# Patient Record
Sex: Female | Born: 1990
Health system: Southern US, Community
[De-identification: ages and names within clinical notes are randomized; demographics above are authoritative.]

## PROBLEM LIST (undated history)

## (undated) DIAGNOSIS — Z8619 Personal history of other infectious and parasitic diseases: Secondary | ICD-10-CM

## (undated) DIAGNOSIS — E282 Polycystic ovarian syndrome: Secondary | ICD-10-CM

## (undated) DIAGNOSIS — T7421XA Adult sexual abuse, confirmed, initial encounter: Secondary | ICD-10-CM

## (undated) DIAGNOSIS — Z9889 Other specified postprocedural states: Secondary | ICD-10-CM

## (undated) DIAGNOSIS — E669 Obesity, unspecified: Secondary | ICD-10-CM

## (undated) DIAGNOSIS — D649 Anemia, unspecified: Secondary | ICD-10-CM

## (undated) DIAGNOSIS — F32A Depression, unspecified: Secondary | ICD-10-CM

## (undated) DIAGNOSIS — R112 Nausea with vomiting, unspecified: Secondary | ICD-10-CM

## (undated) DIAGNOSIS — F329 Major depressive disorder, single episode, unspecified: Secondary | ICD-10-CM

## (undated) HISTORY — DX: Major depressive disorder, single episode, unspecified: F32.9

## (undated) HISTORY — DX: Depression, unspecified: F32.A

## (undated) HISTORY — DX: Personal history of other infectious and parasitic diseases: Z86.19

---

## 2011-12-04 ENCOUNTER — Encounter (HOSPITAL_COMMUNITY): Payer: Self-pay | Admitting: Family Medicine

## 2011-12-04 ENCOUNTER — Emergency Department (HOSPITAL_COMMUNITY)
Admission: EM | Admit: 2011-12-04 | Discharge: 2011-12-05 | Disposition: A | Discharge status: Home / Self Care | Payer: Self-pay | Attending: Emergency Medicine | Admitting: Emergency Medicine

## 2011-12-04 DIAGNOSIS — R1012 Left upper quadrant pain: Secondary | ICD-10-CM | POA: Insufficient documentation

## 2011-12-04 DIAGNOSIS — E669 Obesity, unspecified: Secondary | ICD-10-CM | POA: Insufficient documentation

## 2011-12-04 HISTORY — DX: Polycystic ovarian syndrome: E28.2

## 2011-12-04 LAB — URINALYSIS, ROUTINE W REFLEX MICROSCOPIC
Glucose, UA: NEGATIVE mg/dL
Ketones, ur: NEGATIVE mg/dL
Leukocytes, UA: NEGATIVE
Nitrite: NEGATIVE
Protein, ur: 30 mg/dL — AB
pH: 5.5 (ref 5.0–8.0)

## 2011-12-04 LAB — URINE MICROSCOPIC-ADD ON

## 2011-12-04 LAB — POCT PREGNANCY, URINE: Preg Test, Ur: NEGATIVE

## 2011-12-04 NOTE — ED Notes (Addendum)
Patient states she started having left side pain 2 hours ago prior to arrival. 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 States she took Ibuprofen 800mg  with minimal relief. History of PCOS.

## 2011-12-04 NOTE — ED Notes (Signed)
Pain started suddenly in left lower quadrant accompanied by nausea.

## 2011-12-05 ENCOUNTER — Emergency Department (HOSPITAL_COMMUNITY): Payer: Self-pay

## 2011-12-05 LAB — COMPREHENSIVE METABOLIC PANEL
Albumin: 3.4 g/dL — ABNORMAL LOW (ref 3.5–5.2)
Alkaline Phosphatase: 76 U/L (ref 39–117)
BUN: 10 mg/dL (ref 6–23)
Calcium: 9.3 mg/dL (ref 8.4–10.5)
Creatinine, Ser: 0.69 mg/dL (ref 0.50–1.10)
GFR calc Af Amer: >90 mL/min (ref >90)
Glucose, Bld: 82 mg/dL (ref 70–99)
Total Protein: 7.9 g/dL (ref 6.0–8.3)

## 2011-12-05 LAB — DIFFERENTIAL
Basophils Relative: 0 % (ref 0–1)
Eosinophils Absolute: 0.2 10*3/uL (ref 0.0–0.7)
Eosinophils Relative: 2 % (ref 0–5)
Lymphs Abs: 3.5 10*3/uL (ref 0.7–4.0)
Monocytes Absolute: 0.7 10*3/uL (ref 0.1–1.0)
Monocytes Relative: 6 % (ref 3–12)
Neutrophils Relative %: 60 % (ref 43–77)

## 2011-12-05 LAB — CBC
Hemoglobin: 11.7 g/dL — ABNORMAL LOW (ref 12.0–15.0)
MCH: 25.8 pg — ABNORMAL LOW (ref 26.0–34.0)
MCHC: 31.8 g/dL (ref 30.0–36.0)
MCV: 81.2 fL (ref 78.0–100.0)
RBC: 4.53 MIL/uL (ref 3.87–5.11)

## 2011-12-05 MED ORDER — MORPHINE SULFATE 4 MG/ML IJ SOLN
4.0000 mg | Freq: Once | INTRAMUSCULAR | Status: AC
  Administered 2011-12-05: 4 mg via INTRAVENOUS
  Filled 2011-12-05: qty 1

## 2011-12-05 MED ORDER — SODIUM CHLORIDE 0.9 % IV BOLUS (SEPSIS)
1000.0000 mL | Freq: Once | INTRAVENOUS | Status: AC
  Administered 2011-12-05: 1000 mL via INTRAVENOUS

## 2011-12-05 MED ORDER — METOCLOPRAMIDE HCL 10 MG PO TABS: 10.0000 mg | ORAL_TABLET | Freq: Four times a day (QID) | ORAL | Status: DC

## 2011-12-05 MED ORDER — ONDANSETRON HCL 4 MG/2ML IJ SOLN
4.0000 mg | Freq: Once | INTRAMUSCULAR | Status: AC
  Administered 2011-12-05: 4 mg via INTRAVENOUS
  Filled 2011-12-05: qty 2

## 2011-12-05 MED ORDER — TRAMADOL HCL 50 MG PO TABS: 50.0000 mg | ORAL_TABLET | Freq: Four times a day (QID) | ORAL | Status: AC | PRN

## 2011-12-05 NOTE — ED Notes (Signed)
Patient transported to CT 

## 2011-12-05 NOTE — Discharge Instructions (Signed)

## 2011-12-05 NOTE — ED Notes (Signed)
Returned from CT.

## 2011-12-05 NOTE — ED Provider Notes (Signed)
History     CSN: [id number]  Arrival date & time 12/04/11  1836   First MD Initiated Contact with Patient 12/04/11 2337      Chief Complaint  Patient presents with  . Abdominal Pain  . Nausea    (Consider location/radiation/quality/duration/timing/severity/associated sxs/prior treatment) HPI Pt was at work when she had sudden onset LUQ stabbing pain. +Nausea. No vaginal bleeding or d/c. No fever or chills. Pt took 800mg  of ibuprofen with moderate resolution. Pt with normal period 2 weeks ago.  Past Medical History  Diagnosis Date  . Polycystic ovarian syndrome     History reviewed. No pertinent past surgical history.  No family history on file.  History  Substance Use Topics  . Smoking status: Never Smoker   . Smokeless tobacco: Not on file  . Alcohol Use: Yes     Occasional     OB History    Grav Para Term Preterm Abortions TAB SAB Ect Mult Living                  Review of Systems  Constitutional: Negative for fever and chills.  Gastrointestinal: Positive for nausea and abdominal pain. Negative for vomiting, diarrhea and constipation.  Genitourinary: Positive for hematuria. Negative for dysuria, flank pain, vaginal bleeding, vaginal discharge and pelvic pain.  Musculoskeletal: Negative for back pain.  Skin: Negative for rash.  Neurological: Negative for dizziness, weakness and numbness.    Allergies  Review of patient's allergies indicates no known allergies.  Home Medications   Current Outpatient Rx  Name Route Sig Dispense Refill  . ETONOGESTREL 68 MG [place] IMPL Subcutaneous Inject 1 each into the skin once.    Marland Kitchen METOCLOPRAMIDE HCL 10 MG PO TABS Oral Take 1 tablet (10 mg total) by mouth every 6 (six) hours. 30 tablet 0  . TRAMADOL HCL 50 MG PO TABS Oral Take 1 tablet (50 mg total) by mouth every 6 (six) hours as needed for pain. 15 tablet 0    BP 126/76  Pulse 88  Temp(Src) 97.5 F (36.4 C) (Oral)  Resp 18  Ht 5\' 3"  (1.6 m)  Wt 260 lb (117.935 kg)   BMI 46.06 kg/m2  SpO2 97%  LMP 11/20/2011  Physical Exam  Nursing note and vitals reviewed. Constitutional: She is oriented to person, place, and time. She appears well-developed and well-nourished. No distress.       obese  HENT:  Head: Normocephalic and atraumatic.  Mouth/Throat: Oropharynx is clear and moist.  Eyes: EOM are normal. Pupils are equal, round, and reactive to light.  Neck: Normal range of motion. Neck supple.  Cardiovascular: Normal rate and regular rhythm.   Pulmonary/Chest: Effort normal and breath sounds normal. No respiratory distress. She has no wheezes. She has no rales.  Abdominal: Soft. Bowel sounds are normal. She exhibits no mass. There is tenderness (mild LUQ TTP without rebound or guarding. ). There is no rebound and no guarding.       No lower abd TTP   Musculoskeletal: Normal range of motion. She exhibits no edema and no tenderness.  Neurological: She is alert and oriented to person, place, and time.  Skin: Skin is warm and dry. No rash noted. No erythema.  Psychiatric: She has a normal mood and affect. Her behavior is normal.    ED Course  Procedures (including critical care time)  Labs Reviewed  URINALYSIS, ROUTINE W REFLEX MICROSCOPIC - Abnormal; Notable for the following:    APPearance CLOUDY (*)    Specific  Gravity, Urine 1.036 (*)    Hgb urine dipstick LARGE (*)    Protein, ur 30 (*)    All other components within normal limits  URINE MICROSCOPIC-ADD ON - Abnormal; Notable for the following:    Squamous Epithelial / LPF MANY (*)    Bacteria, UA FEW (*)    All other components within normal limits  CBC - Abnormal; Notable for the following:    WBC 11.0 (*)    Hemoglobin 11.7 (*)    MCH 25.8 (*)    RDW 15.7 (*)    All other components within normal limits  COMPREHENSIVE METABOLIC PANEL - Abnormal; Notable for the following:    Albumin 3.4 (*)    Total Bilirubin 0.2 (*)    All other components within normal limits  POCT PREGNANCY,  URINE  DIFFERENTIAL   Ct Abdomen Pelvis Wo Contrast  12/05/2011  *RADIOLOGY REPORT*  Clinical Data: Left-sided abdominal pain.  Hematuria.  CT ABDOMEN AND PELVIS WITHOUT CONTRAST  Technique:  Multidetector CT imaging of the abdomen and pelvis was performed following the standard protocol without intravenous contrast.  Comparison: None.  Findings: Morbid obesity.  No evidence of urinary tract calculi or obstruction.  Within the limits of the unenhanced technique, no focal parenchymal abnormality involving either kidney.  Normal low dose unenhanced appearance of the liver, spleen, pancreas, and adrenal glands.  Gallbladder unremarkable by CT.  No biliary ductal dilation.  No visible atherosclerosis.  No significant lymphadenopathy.  Normal-appearing stomach, small bowel, and colon.  Normal appendix in the right upper pelvis.  No ascites.  Small umbilical hernia containing fat.  Uterus and ovaries normal.  Urinary bladder decompressed and unremarkable.  Bone window images demonstrate early mild degenerative changes in the left sacroiliac joint.  Visualized lung bases clear.  IMPRESSION:  1.  No evidence of urinary tract calculi or obstruction. 2.  No acute abnormalities involving the abdomen or pelvis. 3.  Small umbilical hernia containing fat.  Original Report Authenticated By: Arnell Sieving, M.D.     1. Abdominal pain       MDM  Colic-like pain now resolved after pain meds. CT essentially normal. No pelvic discomfort. Will d/c with instructions to return for worsening pain, uncontrolled vomiting or any concerns. Pt and Pt's mother voiced understanding        Loren Racer, MD 12/05/11 [phone]

## 2014-08-08 ENCOUNTER — Encounter (HOSPITAL_COMMUNITY): Payer: Self-pay | Admitting: Emergency Medicine

## 2014-08-08 ENCOUNTER — Emergency Department (HOSPITAL_COMMUNITY)
Admission: EM | Admit: 2014-08-08 | Discharge: 2014-08-08 | Disposition: A | Discharge status: Home / Self Care | Payer: Self-pay | Attending: Emergency Medicine | Admitting: Emergency Medicine

## 2014-08-08 DIAGNOSIS — H9201 Otalgia, right ear: Secondary | ICD-10-CM | POA: Insufficient documentation

## 2014-08-08 DIAGNOSIS — Z8739 Personal history of other diseases of the musculoskeletal system and connective tissue: Secondary | ICD-10-CM | POA: Insufficient documentation

## 2014-08-08 DIAGNOSIS — Z79899 Other long term (current) drug therapy: Secondary | ICD-10-CM | POA: Insufficient documentation

## 2014-08-08 DIAGNOSIS — J029 Acute pharyngitis, unspecified: Secondary | ICD-10-CM

## 2014-08-08 MED ORDER — ACETAMINOPHEN-CODEINE #3 300-30 MG PO TABS: 1.0000 | ORAL_TABLET | Freq: Four times a day (QID) | ORAL | Status: DC | PRN

## 2014-08-08 MED ORDER — AMOXICILLIN 400 MG/5ML PO SUSR: 500.0000 mg | Freq: Three times a day (TID) | ORAL | Status: AC

## 2014-08-08 NOTE — Discharge Instructions (Signed)

## 2014-08-08 NOTE — ED Notes (Signed)
Pt c/o right sided ear pain x 2 days with sore throat

## 2014-08-08 NOTE — ED Provider Notes (Signed)
CSN: [id number]     Arrival date & time 08/08/14  1445 History  This chart was scribed for non-physician practitioner, Marlon Pel, PA-C working with Enid Skeens, MD by Greggory Stallion, ED scribe. This patient was seen in room TR10C/TR10C and the patient's care was started at 3:49 PM.     Chief Complaint  Patient presents with  . Otalgia   The history is provided by the patient. No language interpreter was used.    HPI Comments: Emily Frank is a 24 y.o. female who presents to the Emergency Department complaining of sore throat with associated right ear pain that started yesterday. Describes the sore throat as sharp and stabbing. Swallowing worsens pain. She has taken ibuprofen with no relief. Denies fever, cough, congestion,  muscle aches, chills, fevers, headaches, abdominal pain, vomiting, diarrhea.  Pt has been around other sick contacts and did not get the flu shot this year. The patient denies headaches, neck pain, weakness, vision changes, severe abdominal pain, inability to eat or drink, difficulty breathing, SOB, wheezing, chest pain. The patient has tried cough medicine, NSAIDS, and rest but has only felt mild relief.     Past Medical History  Diagnosis Date  . Polycystic ovarian syndrome    History reviewed. No pertinent past surgical history. History reviewed. No pertinent family history. History  Substance Use Topics  . Smoking status: Never Smoker   . Smokeless tobacco: Not on file  . Alcohol Use: Yes     Comment: Occasional    OB History    No data available     Review of Systems  Constitutional: Negative for fever.  HENT: Positive for ear pain and sore throat.   All other systems reviewed and are negative.  Allergies  Review of patient's allergies indicates no known allergies.  Home Medications   Prior to Admission medications   Medication Sig Start Date End Date Taking? Authorizing Provider  acetaminophen-codeine (TYLENOL #3) 300-30 MG per tablet  Take 1-2 tablets by mouth every 6 (six) hours as needed for moderate pain. 08/08/14   Jannell Franta Irine Seal, PA-C  amoxicillin (AMOXIL) 400 MG/5ML suspension Take 6.3 mLs (500 mg total) by mouth 3 (three) times daily. 08/08/14 08/15/14  Dorthula Matas, PA-C  etonogestrel (IMPLANON) 68 MG IMPL implant Inject 1 each into the skin once.    Historical Provider, MD  metoCLOPramide (REGLAN) 10 MG tablet Take 1 tablet (10 mg total) by mouth every 6 (six) hours. 12/05/11 12/15/11  Loren Racer, MD   BP 128/73 mmHg  Pulse 108  Temp(Src) 97.7 F (36.5 C) (Oral)  Resp 18  SpO2 100%   Physical Exam  Constitutional: She is oriented to person, place, and time. She appears well-developed and well-nourished. No distress.  HENT:  Head: Normocephalic and atraumatic.  Right Ear: Tympanic membrane, external ear and ear canal normal.  Left Ear: Tympanic membrane, external ear and ear canal normal.  Nose: Nose normal. No rhinorrhea. Right sinus exhibits no maxillary sinus tenderness and no frontal sinus tenderness. Left sinus exhibits no maxillary sinus tenderness and no frontal sinus tenderness.  Mouth/Throat: Uvula is midline and mucous membranes are normal. No trismus in the jaw. Normal dentition. No dental abscesses or uvula swelling. Oropharyngeal exudate and posterior oropharyngeal edema present. No posterior oropharyngeal erythema or tonsillar abscesses.  No submental edema, tongue not elevated, no trismus. No impending airway obstruction; Pt able to speak full sentences, swallow intact, no drooling, stridor, or tonsillar/uvula displacement. No palatal petechia  Eyes: Conjunctivae and  EOM are normal.  Neck: Trachea normal, normal range of motion and full passive range of motion without pain. Neck supple. No rigidity. No tracheal deviation and normal range of motion present. No Brudzinski's sign noted.  Flexion and extension of neck without pain or difficulty. Able to breath without difficulty in extension.   Cardiovascular: Normal rate and regular rhythm.   Pulmonary/Chest: Effort normal and breath sounds normal. No stridor. No respiratory distress. She has no wheezes.  Abdominal: Soft. There is no tenderness.  No obvious evidence of splenomegaly. Non ttp.   Musculoskeletal: Normal range of motion.  Lymphadenopathy:       Head (right side): No preauricular and no posterior auricular adenopathy present.       Head (left side): No preauricular and no posterior auricular adenopathy present.    She has cervical adenopathy.  Neurological: She is alert and oriented to person, place, and time.  Skin: Skin is warm and dry. No rash noted. She is not diaphoretic.  Psychiatric: She has a normal mood and affect. Her behavior is normal.  Nursing note and vitals reviewed.   ED Course  Procedures (including critical care time)  DIAGNOSTIC STUDIES: Oxygen Saturation is 100% on RA, normal by my interpretation.    COORDINATION OF CARE: 3:52 PM-Discussed treatment plan which includes an antibiotic and cough medication with pt at bedside and pt agreed to plan.   Labs Review Labs Reviewed - No data to display  Imaging Review No results found.   EKG Interpretation None      MDM   Final diagnoses:  Pharyngitis    Rx; Tylenol w/ codeine  Advised to stay hydrated and to get plenty of rest and eat well.  [name]'s evaluation in the Emergency Department is complete. It has been determined that no acute conditions requiring further emergency intervention are present at this time. The patient/guardian have been advised of the diagnosis and plan. We have discussed signs and symptoms that warrant return to the ED, such as changes or worsening in symptoms.  Vital signs are stable at discharge. Filed Vitals:   08/08/14 1458  BP: 128/73  Pulse: 108  Temp: 97.7 F (36.5 C)  Resp: 18    Patient/guardian has voiced understanding and agreed to follow-up with the PCP or  specialist.   I personally performed the services described in this documentation, which was scribed in my presence. The recorded information has been reviewed and is accurate.  Dorthula Matasiffany G Sherre Wooton, PA-C 08/08/14 1601  Enid SkeensJoshua M Zavitz, MD 08/08/14 [phone]

## 2016-04-29 ENCOUNTER — Encounter (HOSPITAL_COMMUNITY): Payer: Self-pay | Admitting: *Deleted

## 2016-04-29 ENCOUNTER — Emergency Department (HOSPITAL_COMMUNITY)
Admission: EM | Admit: 2016-04-29 | Discharge: 2016-04-29 | Disposition: A | Discharge status: Home / Self Care | Payer: No Typology Code available for payment source | Attending: Emergency Medicine | Admitting: Emergency Medicine

## 2016-04-29 DIAGNOSIS — Y92410 Unspecified street and highway as the place of occurrence of the external cause: Secondary | ICD-10-CM | POA: Insufficient documentation

## 2016-04-29 DIAGNOSIS — Y999 Unspecified external cause status: Secondary | ICD-10-CM | POA: Diagnosis not present

## 2016-04-29 DIAGNOSIS — Y939 Activity, unspecified: Secondary | ICD-10-CM | POA: Diagnosis not present

## 2016-04-29 DIAGNOSIS — M436 Torticollis: Secondary | ICD-10-CM

## 2016-04-29 DIAGNOSIS — S199XXA Unspecified injury of neck, initial encounter: Secondary | ICD-10-CM | POA: Diagnosis present

## 2016-04-29 LAB — URINALYSIS, ROUTINE W REFLEX MICROSCOPIC
BILIRUBIN URINE: NEGATIVE
Glucose, UA: NEGATIVE mg/dL
HGB URINE DIPSTICK: NEGATIVE
KETONES UR: NEGATIVE mg/dL
Leukocytes, UA: NEGATIVE
NITRITE: NEGATIVE
PROTEIN: NEGATIVE mg/dL
SPECIFIC GRAVITY, URINE: 1.015 (ref 1.005–1.030)
pH: 5 (ref 5.0–8.0)

## 2016-04-29 LAB — POC URINE PREG, ED: PREG TEST UR: NEGATIVE

## 2016-04-29 MED ORDER — DIAZEPAM 5 MG PO TABS
5.0000 mg | ORAL_TABLET | Freq: Once | ORAL | Status: AC
  Administered 2016-04-29: 5 mg via ORAL
  Filled 2016-04-29: qty 1

## 2016-04-29 MED ORDER — OXYCODONE-ACETAMINOPHEN 5-325 MG PO TABS
ORAL_TABLET | ORAL | Status: AC
  Filled 2016-04-29: qty 1

## 2016-04-29 MED ORDER — OXYCODONE HCL 5 MG PO TABS
5.0000 mg | ORAL_TABLET | Freq: Once | ORAL | Status: AC
  Administered 2016-04-29: 5 mg via ORAL
  Filled 2016-04-29: qty 1

## 2016-04-29 MED ORDER — IBUPROFEN 800 MG PO TABS
800.0000 mg | ORAL_TABLET | Freq: Once | ORAL | Status: AC
  Administered 2016-04-29: 800 mg via ORAL
  Filled 2016-04-29: qty 1

## 2016-04-29 MED ORDER — OXYCODONE-ACETAMINOPHEN 5-325 MG PO TABS
1.0000 | ORAL_TABLET | Freq: Once | ORAL | Status: AC
  Administered 2016-04-29: 1 via ORAL

## 2016-04-29 MED ORDER — ACETAMINOPHEN 325 MG PO TABS
650.0000 mg | ORAL_TABLET | Freq: Once | ORAL | Status: AC
  Administered 2016-04-29: 650 mg via ORAL
  Filled 2016-04-29: qty 2

## 2016-04-29 NOTE — Discharge Instructions (Signed)
Take 4 over the counter ibuprofen tablets 3 times a day or 2 over-the-counter naproxen tablets twice a day for pain. Also take tylenol 1000mg(2 extra strength) four times a day.    
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
## 2016-10-30 ENCOUNTER — Encounter (HOSPITAL_COMMUNITY): Payer: Self-pay | Admitting: Emergency Medicine

## 2016-10-30 ENCOUNTER — Emergency Department (HOSPITAL_COMMUNITY): Payer: Self-pay

## 2016-10-30 ENCOUNTER — Emergency Department (HOSPITAL_COMMUNITY)
Admission: EM | Admit: 2016-10-30 | Discharge: 2016-10-30 | Disposition: A | Discharge status: Home / Self Care | Payer: Self-pay | Attending: Emergency Medicine | Admitting: Emergency Medicine

## 2016-10-30 DIAGNOSIS — R0789 Other chest pain: Secondary | ICD-10-CM

## 2016-10-30 DIAGNOSIS — R079 Chest pain, unspecified: Secondary | ICD-10-CM | POA: Insufficient documentation

## 2016-10-30 DIAGNOSIS — Z79899 Other long term (current) drug therapy: Secondary | ICD-10-CM | POA: Insufficient documentation

## 2016-10-30 LAB — CBC WITH DIFFERENTIAL/PLATELET
BASOS ABS: 0 10*3/uL (ref 0.0–0.1)
BASOS PCT: 0 %
EOS ABS: 0.2 10*3/uL (ref 0.0–0.7)
EOS PCT: 2 %
HEMATOCRIT: 36.2 % (ref 36.0–46.0)
Hemoglobin: 11.4 g/dL — ABNORMAL LOW (ref 12.0–15.0)
Lymphocytes Relative: 33 %
Lymphs Abs: 2.9 10*3/uL (ref 0.7–4.0)
MCH: 25.4 pg — ABNORMAL LOW (ref 26.0–34.0)
MCHC: 31.5 g/dL (ref 30.0–36.0)
MCV: 80.8 fL (ref 78.0–100.0)
MONO ABS: 0.5 10*3/uL (ref 0.1–1.0)
MONOS PCT: 6 %
NEUTROS ABS: 5 10*3/uL (ref 1.7–7.7)
Neutrophils Relative %: 59 %
PLATELETS: 302 10*3/uL (ref 150–400)
RBC: 4.48 MIL/uL (ref 3.87–5.11)
RDW: 14.6 % (ref 11.5–15.5)
WBC: 8.6 10*3/uL (ref 4.0–10.5)

## 2016-10-30 LAB — BASIC METABOLIC PANEL
Anion gap: 6 (ref 5–15)
BUN: 9 mg/dL (ref 6–20)
CALCIUM: 8.7 mg/dL — AB (ref 8.9–10.3)
CO2: 24 mmol/L (ref 22–32)
CREATININE: 0.57 mg/dL (ref 0.44–1.00)
Chloride: 107 mmol/L (ref 101–111)
GFR calc Af Amer: >60 mL/min (ref >60)
GLUCOSE: 133 mg/dL — AB (ref 65–99)
Potassium: 3.6 mmol/L (ref 3.5–5.1)
Sodium: 137 mmol/L (ref 135–145)

## 2016-10-30 LAB — I-STAT TROPONIN, ED: Troponin i, poc: 0 ng/mL (ref 0.00–0.08)

## 2016-10-30 MED ORDER — KETOROLAC TROMETHAMINE 60 MG/2ML IM SOLN
30.0000 mg | Freq: Once | INTRAMUSCULAR | Status: AC
  Administered 2016-10-30: 30 mg via INTRAMUSCULAR
  Filled 2016-10-30: qty 2

## 2016-10-30 MED ORDER — DIAZEPAM 5 MG PO TABS
5.0000 mg | ORAL_TABLET | Freq: Once | ORAL | Status: AC
  Administered 2016-10-30: 5 mg via ORAL
  Filled 2016-10-30: qty 1

## 2016-10-30 MED ORDER — KETOROLAC TROMETHAMINE 30 MG/ML IJ SOLN: 30.0000 mg | Freq: Once | INTRAMUSCULAR | Status: DC

## 2016-10-30 MED ORDER — IBUPROFEN 800 MG PO TABS: 800.0000 mg | ORAL_TABLET | Freq: Three times a day (TID) | ORAL | 0 refills | Status: DC | PRN

## 2016-10-30 MED ORDER — METHOCARBAMOL 500 MG PO TABS: 500.0000 mg | ORAL_TABLET | Freq: Two times a day (BID) | ORAL | 0 refills | Status: DC | PRN

## 2016-10-30 NOTE — ED Triage Notes (Addendum)
Patient with Emily Frank states she was in class when she felt a sudden sharp pain beneath her left clavicle, pain increases on palpation.  Denies any medical history, states she had a similar pain last night that stopped without intervention, denies long trips.  Patient alert and oriented, tearful due to pain at this time.  Patient states when the pain first started she was unable to move her left arm, patient able to move left arm without difficulty at this time.  Grip strengths equal bilaterally.

## 2016-10-30 NOTE — ED Notes (Signed)
Pt ambulated to restroom from room, tolerated well. 

## 2016-10-30 NOTE — ED Notes (Signed)
Patient transported to X-ray 

## 2016-10-30 NOTE — Discharge Instructions (Signed)
Ibuprofen as needed for pain. Ice or heat to the affected area can help as well.  Please follow up with your primary care provider if symptoms do not improve in the next 2-3 days.  Return to ER for chest pain worse with exertion, difficulty breathing, new/worsening symptoms, any additional concerns.    COLD THERAPY DIRECTIONS:  Ice or gel packs can be used to reduce both pain and swelling. Ice is the most helpful within the first 24 to 48 hours after an injury or flareup from overusing a muscle or joint.  Ice is effective, has very few side effects, and is safe for most people to use.   If you expose your skin to cold temperatures for too long or without the proper protection, you can damage your skin or nerves. Watch for signs of skin damage due to cold.   HOME CARE INSTRUCTIONS  Follow these tips to use ice and cold packs safely.  Place a dry or damp towel between the ice and skin. A damp towel will cool the skin more quickly, so you may need to shorten the time that the ice is used.  For a more rapid response, add gentle compression to the ice.  Ice for no more than 10 to 20 minutes at a time. The bonier the area you are icing, the less time it will take to get the benefits of ice.  Check your skin after 5 minutes to make sure there are no signs of a poor response to cold or skin damage.  Rest 20 minutes or more in between uses.  Once your skin is numb, you can end your treatment. You can test numbness by very lightly touching your skin. The touch should be so light that you do not see the skin dimple from the pressure of your fingertip. When using ice, most people will feel these normal sensations in this order: cold, burning, aching, and numbness.

## 2016-10-30 NOTE — ED Provider Notes (Signed)
[facility]EMER[facility] DEPT Provider Note   CSN: [id number] Arrival date & time: 10/30/16  1036     History   Chief Complaint Chief Complaint  Patient presents with  . Chest Pain    HPI Emily Frank is a 26 y.o. female.  The history is provided by the patient and medical records. No language interpreter was used.  Chest Pain   Associated symptoms include palpitations.   Emily Frank is a 26 y.o. female  with a PMH of PCOS who presents to the Emergency Department complaining of intermittent chest pain since last night. Patient states that last night around 3 AM, she felt sharp left upper chest wall pain. Patient states pain lasted 5-10 minutes then resolved and she went back to sleep. She drove to class today and states she "just didn't feel like my normal self". At around 8 AM, she noticed sharp pain to the left side of her chest returned. She then felt like her heart was beating very fast. She sat in her car for about 20 minutes and pain resolved. She went to class and around 9:30 at the break, chest pain returned. She went to tell her teacher and teacher told her that she looked clammy and she should go to the hospital. She states that she felt like she may faint, but did not. She never felt short of breath. Pain does not radiate. She does endorse that left side of her chest hurt so badly that she was unable to move her left arm. Pain intensifies drastically when she does lift the arm. No medications were taken prior to arrival for symptoms. She denies leg swelling, hemoptysis, recent surgery/trauma/immobilization, hormone use or prior blood clots. She is not a smoker. She has no prior cardiac history. No history of hypertension, hyperlipidemia, and diabetes. Her mother did have a heart valve replacement and triple bypass.    Past Medical History:  Diagnosis Date  . Polycystic ovarian syndrome     There are no active problems to display for this patient.   History reviewed. No pertinent  surgical history.  OB History    No data available       Home Medications    Prior to Admission medications   Medication Sig Start Date End Date Taking? Authorizing Provider  acetaminophen-codeine (TYLENOL #3) 300-30 MG per tablet Take 1-2 tablets by mouth every 6 (six) hours as needed for moderate pain. 08/08/14   Tiffany Neva Seat, PA-C  etonogestrel (IMPLANON) 68 MG IMPL implant Inject 1 each into the skin once.    Historical Provider, MD  ibuprofen (ADVIL,MOTRIN) 800 MG tablet Take 1 tablet (800 mg total) by mouth every 8 (eight) hours as needed. 10/30/16   Chase Picket Ward, PA-C  methocarbamol (ROBAXIN) 500 MG tablet Take 1 tablet (500 mg total) by mouth 2 (two) times daily as needed for muscle spasms. 10/30/16   Chase Picket Ward, PA-C  metoCLOPramide (REGLAN) 10 MG tablet Take 1 tablet (10 mg total) by mouth every 6 (six) hours. 12/05/11 12/15/11  Loren Racer, MD    Family History No family history on file.  Social History Social History  Substance Use Topics  . Smoking status: Never Smoker  . Smokeless tobacco: Never Used  . Alcohol use Yes     Comment: Occasional      Allergies   Patient has no known allergies.   Review of Systems Review of Systems  Cardiovascular: Positive for chest pain and palpitations. Negative for leg swelling.  Musculoskeletal: Positive for arthralgias (  Left shoulder pain) and myalgias (Left shoulder pain).  All other systems reviewed and are negative.    Physical Exam Updated Vital Signs BP (!) 143/85   Pulse 92   Temp 98.2 F (36.8 C) (Oral)   Resp (!) 23   Ht  (1.626 m)   Wt (!) 145.2 kg   LMP 06/15/2016 (Exact Date) Comment: irreg due to policystic ovarian syndrome  SpO2 98%   BMI 54.93 kg/m   Physical Exam  Constitutional: She is oriented to person, place, and time. She appears well-developed and well-nourished. No distress.  Anxious, tearful obese female.   HENT:  Head: Normocephalic and atraumatic.    Cardiovascular: Normal rate, regular rhythm and normal heart sounds.   No murmur heard. Pulmonary/Chest: Effort normal and breath sounds normal. No respiratory distress. She exhibits tenderness.    Abdominal: Soft. She exhibits no distension. There is no tenderness.  Musculoskeletal: She exhibits no edema.  Left shoulder : TTP along musculature as depicted in pulmonary image. Decreased ROM 2/2 pain.  Empty can test, Neer's. No swelling, erythema, or ecchymosis present. No step-off, crepitus, or deformity appreciated. 5/5 muscle strength of BUE. 2+ radial pulse, sensation intact, all compartments soft.   Neurological: She is alert and oriented to person, place, and time.  Skin: Skin is warm and dry.  Nursing note and vitals reviewed.    ED Treatments / Results  Labs (all labs ordered are listed, but only abnormal results are displayed) Labs Reviewed  BASIC METABOLIC PANEL - Abnormal; Notable for the following:       Result Value   Glucose, Bld 133 (*)    Calcium 8.7 (*)    All other components within normal limits  CBC WITH DIFFERENTIAL/PLATELET - Abnormal; Notable for the following:    Hemoglobin 11.4 (*)    MCH 25.4 (*)    All other components within normal limits  I-STAT TROPOININ, ED    EKG  EKG Interpretation  Date/Time:  Thursday October 30 2016 10:36:48 EDT Ventricular Rate:  92 PR Interval:    QRS Duration: 74 QT Interval:  346 QTC Calculation: 426 R Axis:   62 Text Interpretation:  Sinus rhythm Nonspecific ST and T wave abnormality No old tracing to compare Confirmed by CAMPOS  MD, Caryn Bee ([id number]) on 10/30/2016 11:38:42 AM       Radiology Dg Chest 2 View  Result Date: 10/30/2016 CLINICAL DATA:  26 year old female with left chest pain radiating to the shoulder today. Symptoms increase with movement. EXAM: CHEST  2 VIEW COMPARISON:  Chest radiographs 10/04/2011. FINDINGS: Large body habitus. Low lung volumes. Normal cardiac size and mediastinal contours. Visualized  tracheal air column is within normal limits. No pneumothorax, pulmonary edema, pleural effusion or confluent pulmonary opacity. Negative visible bowel gas pattern. No acute osseous abnormality identified. IMPRESSION: Low lung volumes, otherwise no acute cardiopulmonary abnormality. Electronically Signed   By: Odessa Fleming M.D.   On: 10/30/2016 11:48   Dg Shoulder Left  Result Date: 10/30/2016 CLINICAL DATA:  26 year old female with left chest pain radiating to the shoulder today. Symptoms increase with movement. EXAM: LEFT SHOULDER - 2+ VIEW COMPARISON:  Chest radiographs today. FINDINGS: Bone mineralization is within normal limits. No glenohumeral joint dislocation. Proximal left humerus intact. Visible left clavicle and scapula appear intact. Visible left ribs appear intact. IMPRESSION: Negative. Electronically Signed   By: Odessa Fleming M.D.   On: 10/30/2016 11:48    Procedures Procedures (including critical care time)  Medications Ordered in ED Medications  diazepam (VALIUM) tablet 5 mg (5 mg Oral Given 10/30/16 1155)  ketorolac (TORADOL) injection 30 mg (30 mg Intramuscular Given 10/30/16 1155)     Initial Impression / Assessment and Plan / ED Course  I have reviewed the triage vital signs and the nursing notes.  Pertinent labs & imaging results that were available during my care of the patient were reviewed by me and considered in my medical decision making (see chart for details).    Emily Frank is a 26 y.o. female who presents to ED for intermittent chest pain since last night. On exam, area of pain is more left upper chest / clavicular area and left shoulder pain. Pain is reproducible with palpation as well as movement of the left shoulder. Troponin negative, CBC and BMP are reassuring. Chest x-ray and plain film of the shoulder negative. EKG nonischemic. PERC negative and low risk heart score of 2. Doubt cardiopulmonary etiology for today's symptoms. Will treat with symptomatic home care  including Rx for ibuprofen and Robaxin. Encouraged ECP follow-up if symptoms persist and patient understands to return to ER if new/worsening symptoms develop. All questions answered and patient feels comfortable with plan as dictated above.   Final Clinical Impressions(s) / ED Diagnoses   Final diagnoses:  Chest pain  Chest wall pain    New Prescriptions Discharge Medication List as of 10/30/2016 12:02 PM    START taking these medications   Details  ibuprofen (ADVIL,MOTRIN) 800 MG tablet Take 1 tablet (800 mg total) by mouth every 8 (eight) hours as needed., Starting Thu 10/30/2016, Print    methocarbamol (ROBAXIN) 500 MG tablet Take 1 tablet (500 mg total) by mouth 2 (two) times daily as needed for muscle spasms., Starting Thu 10/30/2016, Print         [employer] Ward, PA-C 10/30/16 1220    Azalia Bilis, MD 10/30/16 [phone]

## 2016-10-30 NOTE — ED Notes (Signed)
Pt is in stable condition upon d/c and ambulates from ED. 

## 2017-03-11 ENCOUNTER — Encounter (HOSPITAL_COMMUNITY): Payer: Self-pay | Admitting: *Deleted

## 2017-03-11 ENCOUNTER — Emergency Department (HOSPITAL_COMMUNITY)
Admission: EM | Admit: 2017-03-11 | Discharge: 2017-03-11 | Disposition: A | Discharge status: Home / Self Care | Payer: Medicaid Other | Attending: Emergency Medicine | Admitting: Emergency Medicine

## 2017-03-11 DIAGNOSIS — M542 Cervicalgia: Secondary | ICD-10-CM | POA: Insufficient documentation

## 2017-03-11 DIAGNOSIS — Z79899 Other long term (current) drug therapy: Secondary | ICD-10-CM | POA: Insufficient documentation

## 2017-03-11 HISTORY — DX: Obesity, unspecified: E66.9

## 2017-03-11 MED ORDER — CYCLOBENZAPRINE HCL 10 MG PO TABS: 10.0000 mg | ORAL_TABLET | Freq: Two times a day (BID) | ORAL | 0 refills | Status: DC | PRN

## 2017-03-11 MED ORDER — OXYCODONE-ACETAMINOPHEN 5-325 MG PO TABS: 1.0000 | ORAL_TABLET | Freq: Once | ORAL | Status: DC

## 2017-03-11 MED ORDER — LORAZEPAM 1 MG PO TABS
1.0000 mg | ORAL_TABLET | Freq: Once | ORAL | Status: AC
  Administered 2017-03-11: 1 mg via ORAL
  Filled 2017-03-11: qty 1

## 2017-03-11 MED ORDER — CYCLOBENZAPRINE HCL 10 MG PO TABS
5.0000 mg | ORAL_TABLET | Freq: Once | ORAL | Status: AC
  Administered 2017-03-11: 5 mg via ORAL
  Filled 2017-03-11: qty 1

## 2017-03-11 NOTE — ED Notes (Signed)
Pt verbalized understanding discharge instructions and denies any further needs or questions at this time. VS stable, ambulatory and steady gait.   

## 2017-03-11 NOTE — Discharge Instructions (Signed)
Please read attached information. If you experience any new or worsening signs or symptoms please return to the emergency room for evaluation. Please follow-up with your primary care provider or specialist as discussed. Please use medication prescribed only as directed and discontinue taking if you have any concerning signs or symptoms.   °

## 2017-03-11 NOTE — ED Triage Notes (Signed)
Pt reports onset one hour ago of left side neck pain. Denies injury. States that she feels like her throat is swelling and had episode of n/v x 1 pta. Airway intact at triage.

## 2017-03-11 NOTE — ED Provider Notes (Signed)
[facility] Provider Note   CSN: [id number] Arrival date & time: 03/11/17  1429     History   Chief Complaint Chief Complaint  Patient presents with  . Neck Pain    HPI Emily Frank is a 26 y.o. female.  HPI   26 year old female presents today with complaints of left neck pain.  Patient reports symptoms were not at 11, reports pain is worse with movement of the neck, located in the left posterior region.  She notes exquisite tenderness palpation of the musculature.  She denies any dizziness, headache, or any neurological deficits.  Patient denies any injury.  She does note that she is under significant stress that she is here visiting her mother in the ICU.  No medications prior to arrival.  She denies any history of the same.    Past Medical History:  Diagnosis Date  . Obesity   . Polycystic ovarian syndrome     There are no active problems to display for this patient.   History reviewed. No pertinent surgical history.  OB History    No data available       Home Medications    Prior to Admission medications   Medication Sig Start Date End Date Taking? Authorizing Provider  acetaminophen-codeine (TYLENOL #3) 300-30 MG per tablet Take 1-2 tablets by mouth every 6 (six) hours as needed for moderate pain. 08/08/14   Marlon PelGreene, Tiffany, PA-C  cyclobenzaprine (FLEXERIL) 10 MG tablet Take 1 tablet (10 mg total) by mouth 2 (two) times daily as needed for muscle spasms. 03/11/17   Latrese Carolan, Tinnie GensJeffrey, PA-C  etonogestrel (IMPLANON) 68 MG IMPL implant Inject 1 each into the skin once.    [provider]  ibuprofen (ADVIL,MOTRIN) 800 MG tablet Take 1 tablet (800 mg total) by mouth every 8 (eight) hours as needed. 10/30/16   Ward, Chase PicketJaime Pilcher, PA-C  methocarbamol (ROBAXIN) 500 MG tablet Take 1 tablet (500 mg total) by mouth 2 (two) times daily as needed for muscle spasms. 10/30/16   Ward, Chase PicketJaime Pilcher, PA-C  metoCLOPramide (REGLAN) 10 MG tablet Take 1 tablet (10 mg  total) by mouth every 6 (six) hours. 12/05/11 12/15/11  Loren RacerYelverton, David, MD    Family History History reviewed. No pertinent family history.  Social History Social History  Substance Use Topics  . Smoking status: Never Smoker  . Smokeless tobacco: Never Used  . Alcohol use Yes     Comment: Occasional      Allergies   Patient has no known allergies.   Review of Systems Review of Systems  All other systems reviewed and are negative.    Physical Exam Updated Vital Signs BP (!) 137/103 (BP Location: Left Arm)   Pulse 80   Temp 98.3 F (36.8 C) (Oral)   Resp 18   SpO2 97%   Physical Exam  Constitutional: She is oriented to person, place, and time. She appears well-developed and well-nourished.  Morbidly obese  HENT:  Head: Normocephalic and atraumatic.  Eyes: Pupils are equal, round, and reactive to light. Conjunctivae are normal. Right eye exhibits no discharge. Left eye exhibits no discharge. No scleral icterus.  Neck: Normal range of motion. No JVD present. No tracheal deviation present.  Pulmonary/Chest: Effort normal. No stridor.  Musculoskeletal:  Tenderness palpation of the left lateral cervical musculature-exquisite tenderness to even light palpation or movement of the head  Neurological: She is alert and oriented to person, place, and time. Coordination normal.  Psychiatric: She has a normal mood and affect. Her  behavior is normal. Judgment and thought content normal.  Nursing note and vitals reviewed.    ED Treatments / Results  Labs (all labs ordered are listed, but only abnormal results are displayed) Labs Reviewed - No data to display  EKG  EKG Interpretation None       Radiology No results found.  Procedures Procedures (including critical care time)  Medications Ordered in ED Medications  LORazepam (ATIVAN) tablet 1 mg (1 mg Oral Given 03/11/17 1910)  cyclobenzaprine (FLEXERIL) tablet 5 mg (5 mg Oral Given 03/11/17 2013)     Initial  Impression / Assessment and Plan / ED Course  I have reviewed the triage vital signs and the nursing notes.  Pertinent labs & imaging results that were available during my care of the patient were reviewed by me and considered in my medical decision making (see chart for details).      Final Clinical Impressions(s) / ED Diagnoses   Final diagnoses:  Neck pain    26 year old female presents today with complaints of neck pain this is most likely muscular pain.  Patient's presentation is not consistent, even before I touch her she winces in pain, light palpation produces significant emotional response.  When patient is distracted she does not have similar response.  She has no systemic symptoms, no headache dizziness, this is along the musculature low suspicion for vascular origin.  Patient with no relief after Ativan, she was given Flexeril which provided some relief.  Patient able to lay and move around in the exam bed without significant discomfort.  Patient will be discharged home with muscle relaxers, strict return precautions, follow-up information.  She verbalized understanding and agreement to today's plan had no further questions or concerns.  New Prescriptions New Prescriptions   CYCLOBENZAPRINE (FLEXERIL) 10 MG TABLET    Take 1 tablet (10 mg total) by mouth 2 (two) times daily as needed for muscle spasms.     Eyvonne Mechanic, PA-C 03/11/17 2025    Bethann Berkshire, MD 03/12/17 [phone]

## 2017-04-29 ENCOUNTER — Ambulatory Visit (INDEPENDENT_AMBULATORY_CARE_PROVIDER_SITE_OTHER): Payer: 59 | Admitting: Obstetrics and Gynecology

## 2017-04-29 ENCOUNTER — Encounter: Payer: Self-pay | Admitting: Obstetrics and Gynecology

## 2017-04-29 VITALS — BP 135/78 | HR 97 | Ht 64.0 in | Wt 355.0 lb

## 2017-04-29 DIAGNOSIS — Z3202 Encounter for pregnancy test, result negative: Secondary | ICD-10-CM

## 2017-04-29 DIAGNOSIS — Z113 Encounter for screening for infections with a predominantly sexual mode of transmission: Secondary | ICD-10-CM | POA: Diagnosis not present

## 2017-04-29 DIAGNOSIS — Z01419 Encounter for gynecological examination (general) (routine) without abnormal findings: Secondary | ICD-10-CM

## 2017-04-29 DIAGNOSIS — E282 Polycystic ovarian syndrome: Secondary | ICD-10-CM | POA: Insufficient documentation

## 2017-04-29 DIAGNOSIS — N912 Amenorrhea, unspecified: Secondary | ICD-10-CM

## 2017-04-29 DIAGNOSIS — Z124 Encounter for screening for malignant neoplasm of cervix: Secondary | ICD-10-CM | POA: Diagnosis not present

## 2017-04-29 DIAGNOSIS — Z8619 Personal history of other infectious and parasitic diseases: Secondary | ICD-10-CM

## 2017-04-29 LAB — POCT URINE PREGNANCY: PREG TEST UR: NEGATIVE

## 2017-04-29 MED ORDER — METFORMIN HCL 500 MG PO TABS: 500.0000 mg | ORAL_TABLET | Freq: Every day | ORAL | 3 refills | Status: DC
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
## 2017-04-30 ENCOUNTER — Telehealth: Payer: Self-pay

## 2017-04-30 DIAGNOSIS — H52223 Regular astigmatism, bilateral: Secondary | ICD-10-CM | POA: Diagnosis not present

## 2017-04-30 LAB — HEPATITIS C ANTIBODY

## 2017-04-30 LAB — COMPREHENSIVE METABOLIC PANEL
ALK PHOS: 85 IU/L (ref 39–117)
ALT: 10 IU/L (ref 0–32)
AST: 14 IU/L (ref 0–40)
Albumin/Globulin Ratio: 1.1 — ABNORMAL LOW (ref 1.2–2.2)
Albumin: 4.1 g/dL (ref 3.5–5.5)
BUN/Creatinine Ratio: 14 (ref 9–23)
BUN: 11 mg/dL (ref 6–20)
Bilirubin Total: 0.2 mg/dL (ref 0.0–1.2)
CO2: 23 mmol/L (ref 20–29)
CREATININE: 0.8 mg/dL (ref 0.57–1.00)
Calcium: 9.5 mg/dL (ref 8.7–10.2)
Chloride: 102 mmol/L (ref 96–106)
GFR calc Af Amer: 117 mL/min/{1.73_m2} (ref >59)
GFR calc non Af Amer: 101 mL/min/{1.73_m2} (ref >59)
GLUCOSE: 105 mg/dL — AB (ref 65–99)
Globulin, Total: 3.8 g/dL (ref 1.5–4.5)
Potassium: 4.4 mmol/L (ref 3.5–5.2)
SODIUM: 142 mmol/L (ref 134–144)
Total Protein: 7.9 g/dL (ref 6.0–8.5)

## 2017-04-30 LAB — CYTOLOGY - PAP
Bacterial vaginitis: POSITIVE — AB
Candida vaginitis: NEGATIVE
Chlamydia: POSITIVE — AB
Diagnosis: NEGATIVE
Neisseria Gonorrhea: NEGATIVE
Trichomonas: NEGATIVE

## 2017-04-30 LAB — CBC
HEMATOCRIT: 40.3 % (ref 34.0–46.6)
Hemoglobin: 12.8 g/dL (ref 11.1–15.9)
MCH: 26.4 pg — ABNORMAL LOW (ref 26.6–33.0)
MCHC: 31.8 g/dL (ref 31.5–35.7)
MCV: 83 fL (ref 79–97)
PLATELETS: 339 10*3/uL (ref 150–379)
RBC: 4.85 x10E6/uL (ref 3.77–5.28)
RDW: 16.2 % — AB (ref 12.3–15.4)
WBC: 12.7 10*3/uL — ABNORMAL HIGH (ref 3.4–10.8)

## 2017-04-30 LAB — RPR: RPR: NONREACTIVE

## 2017-04-30 LAB — HEMOGLOBIN A1C
ESTIMATED AVERAGE GLUCOSE: 120 mg/dL
HEMOGLOBIN A1C: 5.8 % — AB (ref 4.8–5.6)

## 2017-04-30 LAB — HIV ANTIBODY (ROUTINE TESTING W REFLEX): HIV SCREEN 4TH GENERATION: NONREACTIVE

## 2017-04-30 NOTE — Telephone Encounter (Signed)
Left VM message to call office.

## 2017-04-30 NOTE — Telephone Encounter (Signed)
-----   Message from Conan BowensKelly M Davis, MD sent at 04/29/2017  5:18 PM EDT ----- Please let patient know to do 2 hr GTT when she goes for lipid panel.

## 2017-05-01 ENCOUNTER — Other Ambulatory Visit: Payer: Self-pay | Admitting: Obstetrics and Gynecology

## 2017-05-01 DIAGNOSIS — A749 Chlamydial infection, unspecified: Secondary | ICD-10-CM

## 2017-05-01 DIAGNOSIS — N760 Acute vaginitis: Secondary | ICD-10-CM

## 2017-05-01 DIAGNOSIS — B9689 Other specified bacterial agents as the cause of diseases classified elsewhere: Secondary | ICD-10-CM

## 2017-05-01 MED ORDER — METRONIDAZOLE 500 MG PO TABS: 500.0000 mg | ORAL_TABLET | Freq: Two times a day (BID) | ORAL | 0 refills | Status: DC

## 2017-05-01 MED ORDER — AZITHROMYCIN 500 MG PO TABS: 1000.0000 mg | ORAL_TABLET | Freq: Once | ORAL | 0 refills | Status: AC

## 2017-05-01 NOTE — Progress Notes (Signed)
Script sent to pharmacy for BV and chlamydia treatment.

## 2017-05-05 ENCOUNTER — Telehealth: Payer: Self-pay | Admitting: Obstetrics and Gynecology

## 2017-05-05 ENCOUNTER — Other Ambulatory Visit: Payer: Self-pay | Admitting: Obstetrics and Gynecology

## 2017-05-05 ENCOUNTER — Other Ambulatory Visit: Payer: Self-pay

## 2017-05-05 ENCOUNTER — Ambulatory Visit (HOSPITAL_COMMUNITY): Payer: No Typology Code available for payment source

## 2017-05-05 ENCOUNTER — Ambulatory Visit: Payer: 59 | Attending: Internal Medicine

## 2017-05-05 DIAGNOSIS — N760 Acute vaginitis: Secondary | ICD-10-CM

## 2017-05-05 DIAGNOSIS — E282 Polycystic ovarian syndrome: Secondary | ICD-10-CM | POA: Diagnosis not present

## 2017-05-05 DIAGNOSIS — B9689 Other specified bacterial agents as the cause of diseases classified elsewhere: Secondary | ICD-10-CM

## 2017-05-05 DIAGNOSIS — A749 Chlamydial infection, unspecified: Secondary | ICD-10-CM

## 2017-05-05 MED ORDER — METRONIDAZOLE 500 MG PO TABS: 500.0000 mg | ORAL_TABLET | Freq: Two times a day (BID) | ORAL | 0 refills | Status: DC

## 2017-05-05 MED ORDER — AZITHROMYCIN 500 MG PO TABS: ORAL_TABLET | ORAL | 0 refills | Status: DC

## 2017-05-05 NOTE — Telephone Encounter (Signed)
Patient returned call, notified patient of + chlamydia and +BV.  Resent prescriptions to the correct pharmacy.  Instructed patient to notify her partner for treatment and to abstain from sex for 14 days post treatment.

## 2017-05-05 NOTE — Progress Notes (Signed)
Patient here for lab visit only 

## 2017-05-05 NOTE — Addendum Note (Signed)
Addended by: Natale MilchSTALLING, Eunie Lawn D on: 05/05/2017 02:24 PM   Modules accepted: Orders

## 2017-05-06 ENCOUNTER — Other Ambulatory Visit: Payer: Self-pay | Admitting: Obstetrics and Gynecology

## 2017-05-06 DIAGNOSIS — E282 Polycystic ovarian syndrome: Secondary | ICD-10-CM | POA: Diagnosis not present

## 2017-05-06 LAB — LIPID PANEL
CHOLESTEROL TOTAL: 165 mg/dL (ref 100–199)
Chol/HDL Ratio: 3.9 ratio (ref 0.0–4.4)
HDL: 42 mg/dL (ref >39)
LDL CALC: 107 mg/dL — AB (ref 0–99)
TRIGLYCERIDES: 78 mg/dL (ref 0–149)
VLDL Cholesterol Cal: 16 mg/dL (ref 5–40)

## 2017-05-07 LAB — GLUCOSE TOLERANCE, 2 HOURS W/ 1HR
Glucose, 1 hour: 155 mg/dL (ref 65–179)
Glucose, 2 hour: 119 mg/dL (ref 65–152)
Glucose, Fasting: 86 mg/dL (ref 65–91)

## 2017-05-10 LAB — TESTOSTERONE,FREE AND TOTAL
TESTOSTERONE FREE: 1.1 pg/mL (ref 0.0–4.2)
TESTOSTERONE: 28 ng/dL (ref 8–48)

## 2017-05-10 LAB — HEPATITIS B SURFACE ANTIGEN: HEP B S AG: NEGATIVE

## 2017-05-10 LAB — 17-HYDROXYPROGESTERONE: 17-Hydroxyprogesterone: 27 ng/dL

## 2017-05-10 LAB — PROLACTIN: Prolactin: 14.9 ng/mL (ref 4.8–23.3)

## 2017-05-10 LAB — TSH: TSH: 1.48 u[IU]/mL (ref 0.450–4.500)

## 2017-05-12 ENCOUNTER — Other Ambulatory Visit: Payer: Self-pay | Admitting: Obstetrics and Gynecology

## 2017-05-12 ENCOUNTER — Encounter: Payer: Self-pay | Admitting: Obstetrics and Gynecology

## 2017-05-12 DIAGNOSIS — A749 Chlamydial infection, unspecified: Secondary | ICD-10-CM

## 2017-05-15 ENCOUNTER — Ambulatory Visit (HOSPITAL_COMMUNITY)
Admission: RE | Admit: 2017-05-15 | Discharge: 2017-05-15 | Disposition: A | Discharge status: Home / Self Care | Payer: 59 | Source: Ambulatory Visit | Attending: Obstetrics and Gynecology | Admitting: Obstetrics and Gynecology

## 2017-05-15 DIAGNOSIS — N920 Excessive and frequent menstruation with regular cycle: Secondary | ICD-10-CM | POA: Diagnosis not present

## 2017-05-15 DIAGNOSIS — E282 Polycystic ovarian syndrome: Secondary | ICD-10-CM | POA: Insufficient documentation

## 2017-05-20 ENCOUNTER — Other Ambulatory Visit: Payer: Self-pay | Admitting: Obstetrics and Gynecology

## 2017-05-20 DIAGNOSIS — Z01419 Encounter for gynecological examination (general) (routine) without abnormal findings: Secondary | ICD-10-CM

## 2017-05-20 DIAGNOSIS — Z7689 Persons encountering health services in other specified circumstances: Secondary | ICD-10-CM

## 2017-05-21 ENCOUNTER — Encounter: Payer: Self-pay | Admitting: Obstetrics and Gynecology

## 2017-05-25 ENCOUNTER — Other Ambulatory Visit: Payer: Self-pay | Admitting: Obstetrics and Gynecology

## 2017-05-25 DIAGNOSIS — E282 Polycystic ovarian syndrome: Secondary | ICD-10-CM

## 2017-05-25 NOTE — Progress Notes (Signed)
Orders put in for further eval of PCOS

## 2017-05-26 ENCOUNTER — Other Ambulatory Visit: Payer: 59

## 2017-05-26 DIAGNOSIS — E282 Polycystic ovarian syndrome: Secondary | ICD-10-CM | POA: Diagnosis not present

## 2017-05-27 LAB — BETA HCG QUANT (REF LAB): hCG Quant: <1 m[IU]/mL

## 2017-05-27 LAB — FOLLICLE STIMULATING HORMONE: FSH: 5.4 m[IU]/mL

## 2017-05-27 LAB — SEDIMENTATION RATE: SED RATE: 36 mm/h — AB (ref 0–32)

## 2017-05-27 LAB — LUTEINIZING HORMONE: LH: 6.1 m[IU]/mL

## 2017-06-02 ENCOUNTER — Ambulatory Visit (HOSPITAL_COMMUNITY)
Admission: EM | Admit: 2017-06-02 | Discharge: 2017-06-02 | Disposition: A | Discharge status: Home / Self Care | Payer: 59 | Attending: Family Medicine | Admitting: Family Medicine

## 2017-06-02 ENCOUNTER — Encounter (HOSPITAL_COMMUNITY): Payer: Self-pay | Admitting: Emergency Medicine

## 2017-06-02 DIAGNOSIS — J029 Acute pharyngitis, unspecified: Secondary | ICD-10-CM | POA: Diagnosis not present

## 2017-06-02 DIAGNOSIS — R0981 Nasal congestion: Secondary | ICD-10-CM | POA: Diagnosis not present

## 2017-06-02 DIAGNOSIS — R69 Illness, unspecified: Secondary | ICD-10-CM

## 2017-06-02 DIAGNOSIS — R05 Cough: Secondary | ICD-10-CM

## 2017-06-02 DIAGNOSIS — J111 Influenza due to unidentified influenza virus with other respiratory manifestations: Secondary | ICD-10-CM

## 2017-06-02 MED ORDER — ONDANSETRON HCL 4 MG PO TABS: 4.0000 mg | ORAL_TABLET | Freq: Four times a day (QID) | ORAL | 0 refills | Status: DC

## 2017-06-02 NOTE — ED Triage Notes (Signed)
PT C/O: cold sx  ONSET: 4 days  SX INCLUDE: prod cough, nasal congestion/drainage, n/v/d, fevers, chills, BAs  DENIES:   TAKING MEDS: OTC acetaminophen w/some relief.   A&O x4... NAD... Ambulatory

## 2017-06-02 NOTE — Discharge Instructions (Signed)
Continue to push fluids to keep secretions thin and ensure adequate hydration. Bland diet as tolerated. Zofran as needed for nausea.continue with tylenol and/or ibuprofen as needed for pain and/or fevers. May use OTC treatments such as nasal sprays or mucinex as needed for symptoms. If symptoms worsen or do not improve in the next week to return to be seen or to follow up with PCP.

## 2017-06-02 NOTE — ED Provider Notes (Signed)
[facility]    CSN: [id number] Arrival date & time: 06/02/17  1014     History   Chief Complaint Chief Complaint  Patient presents with  . URI    HPI Emily Frank is a 26 y.o. female.   Emily Frank presents with complaints of cough, sore throat, nasal congestion and drainage, fevers tmax of 102 last night, as well as nausea vomiting and diarrhea. She states her symptoms started yesterday and worsened last night. She has vomited 3 times this morning and has had two episodes of diarrhea. Denies urinary complaints. Denies ear pain, skin rash. She works at a [employer], no ill contacts at home. Denies abdominal pain, but still with nausea. Has been able to drink fluids. Rates body aches 8/10. She has been taking tylenol which has helped with her fevers, last at 0900 this morning.    ROS per HPI.       Past Medical History:  Diagnosis Date  . H/O chlamydia infection   . H/O gonorrhea   . Obesity   . Polycystic ovarian syndrome     Patient Active Problem List   Diagnosis Date Noted  . PCOS (polycystic ovarian syndrome) 04/29/2017  . Morbid obesity (HCC) 04/29/2017  . Amenorrhea 04/29/2017  . H/O gonorrhea   . H/O chlamydia infection     History reviewed. No pertinent surgical history.  OB History    Gravida Para Term Preterm AB Living   1 0 0 0 1 0   SAB TAB Ectopic Multiple Live Births   1 0 0 0 0       Home Medications    Prior to Admission medications   Medication Sig Start Date End Date Taking? Authorizing Provider  metFORMIN (GLUCOPHAGE) 500 MG tablet Take 1 tablet (500 mg total) by mouth daily with breakfast. 04/29/17  Yes Conan Bowensavis, Kelly M, MD  acetaminophen-codeine (TYLENOL #3) 300-30 MG per tablet Take 1-2 tablets by mouth every 6 (six) hours as needed for moderate pain. 08/08/14   Marlon PelGreene, Tiffany, PA-C  ibuprofen (ADVIL,MOTRIN) 800 MG tablet Take 1 tablet (800 mg total) by mouth every 8 (eight) hours as needed. 10/30/16   Ward, Chase PicketJaime  Pilcher, PA-C  ondansetron (ZOFRAN) 4 MG tablet Take 1 tablet (4 mg total) by mouth every 6 (six) hours. 06/02/17   Georgetta HaberBurky, Stamatia Masri B, NP    Family History Family History  Problem Relation Age of Onset  . Hypertension Mother   . Diabetes Mother   . Hepatitis C Mother   . Renal Disease Mother   . Hypertension Father   . Hepatitis C Father   . Breast cancer Maternal Grandmother   . Brain cancer Maternal Grandmother   . Bone cancer Maternal Grandmother   . Hypertension Maternal Grandmother   . Renal Disease Maternal Grandmother   . Diabetes Maternal Grandmother   . Colon cancer Maternal Grandfather   . Breast cancer Paternal Grandmother   . Hypertension Paternal Grandmother     Social History Social History   Tobacco Use  . Smoking status: Never Smoker  . Smokeless tobacco: Never Used  Substance Use Topics  . Alcohol use: Yes    Comment: Occasional   . Drug use: No     Allergies   Patient has no known allergies.   Review of Systems Review of Systems   Physical Exam Triage Vital Signs ED Triage Vitals [06/02/17 1039]  Enc Vitals Group     BP (!) 133/93     Pulse Rate  86     Resp 16     Temp 98.3 F (36.8 C)     Temp Source Oral     SpO2 100 %     Weight      Height      Head Circumference      Peak Flow      Pain Score 8     Pain Loc      Pain Edu?      Excl. in GC?    No data found.  Updated Vital Signs BP (!) 133/93 (BP Location: Right Wrist)   Pulse 86   Temp 98.3 F (36.8 C) (Oral)   Resp 16   SpO2 100%   Visual Acuity Right Eye Distance:   Left Eye Distance:   Bilateral Distance:    Right Eye Near:   Left Eye Near:    Bilateral Near:     Physical Exam  Constitutional: She is oriented to person, place, and time. She appears well-developed and well-nourished. No distress.  HENT:  Head: Normocephalic and atraumatic.  Right Ear: Tympanic membrane, external ear and ear canal normal.  Left Ear: Tympanic membrane, external ear and ear  canal normal.  Nose: Nose normal.  Mouth/Throat: Uvula is midline and mucous membranes are normal. Posterior oropharyngeal edema and posterior oropharyngeal erythema present. No tonsillar exudate.  Eyes: Conjunctivae and EOM are normal. Pupils are equal, round, and reactive to light.  Cardiovascular: Normal rate, regular rhythm and normal heart sounds.  Pulmonary/Chest: Effort normal and breath sounds normal.  Abdominal: Soft. There is no tenderness.  Lymphadenopathy:    She has no cervical adenopathy.  Neurological: She is alert and oriented to person, place, and time.  Skin: Skin is warm and dry.     UC Treatments / Results  Labs (all labs ordered are listed, but only abnormal results are displayed) Labs Reviewed - No data to display  EKG  EKG Interpretation None       Radiology No results found.  Procedures Procedures (including critical care time)  Medications Ordered in UC Medications - No data to display   Initial Impression / Assessment and Plan / UC Course  I have reviewed the triage vital signs and the nursing notes.  Pertinent labs & imaging results that were available during my care of the patient were reviewed by me and considered in my medical decision making (see chart for details).     Fever well controlled with tylenol, without tachypnea or tachycardia. Benign findings on physical exam. History consistent with viral illness. Supportive cares recommended. Push fluids to ensure adequate hydration and keep secretions thin. Tylenol and/or ibuprofen as needed for pain or fevers. zofran as needed for nausea and vomiting. Bland diet as tolerated. OTC treatments as needed for symptoms discussed. Rest. If symptoms worsen or do not improve in the next week to return to be seen or to follow up with PCP. Patient verbalized understanding and agreeable to plan.       Final Clinical Impressions(s) / UC Diagnoses   Final diagnoses:  Influenza-like illness    ED  Discharge Orders        Ordered    ondansetron (ZOFRAN) 4 MG tablet  Every 6 hours     06/02/17 1103       Controlled Substance Prescriptions [place] Controlled Substance Registry consulted? Not Applicable   Georgetta HaberBurky, Shanik Brookshire B, NP 06/02/17 1110

## 2017-06-15 ENCOUNTER — Encounter: Payer: Self-pay | Admitting: Obstetrics and Gynecology

## 2017-06-15 ENCOUNTER — Other Ambulatory Visit (HOSPITAL_COMMUNITY)
Admission: RE | Admit: 2017-06-15 | Discharge: 2017-06-15 | Disposition: A | Discharge status: Home / Self Care | Payer: 59 | Source: Ambulatory Visit | Attending: Obstetrics and Gynecology | Admitting: Obstetrics and Gynecology

## 2017-06-15 ENCOUNTER — Ambulatory Visit (INDEPENDENT_AMBULATORY_CARE_PROVIDER_SITE_OTHER): Payer: 59 | Admitting: Obstetrics and Gynecology

## 2017-06-15 VITALS — BP 126/80 | HR 86 | Wt 351.0 lb

## 2017-06-15 DIAGNOSIS — Z202 Contact with and (suspected) exposure to infections with a predominantly sexual mode of transmission: Secondary | ICD-10-CM | POA: Insufficient documentation

## 2017-06-15 MED ORDER — CEFTRIAXONE SODIUM 250 MG IJ SOLR
250.0000 mg | Freq: Once | INTRAMUSCULAR | Status: AC
Start: 2017-06-15 — End: 2017-06-15
  Administered 2017-06-15: 250 mg via INTRAMUSCULAR

## 2017-06-15 MED ORDER — AZITHROMYCIN 500 MG PO TABS: 1000.0000 mg | ORAL_TABLET | Freq: Once | ORAL | 1 refills | Status: AC

## 2017-06-15 NOTE — Progress Notes (Signed)
26 yo G1P0010 here for STI screen. Patient reports being exposed to gonorrhea. She was informed on 06/11/2017. Patient denies any abnormal discharge or pelvic pain.   Past Medical History:  Diagnosis Date  . H/O chlamydia infection   . H/O gonorrhea   . Obesity   . Polycystic ovarian syndrome    No past surgical history on file. Family History  Problem Relation Age of Onset  . Hypertension Mother   . Diabetes Mother   . Hepatitis C Mother   . Renal Disease Mother   . Hypertension Father   . Hepatitis C Father   . Breast cancer Maternal Grandmother   . Brain cancer Maternal Grandmother   . Bone cancer Maternal Grandmother   . Hypertension Maternal Grandmother   . Renal Disease Maternal Grandmother   . Diabetes Maternal Grandmother   . Colon cancer Maternal Grandfather   . Breast cancer Paternal Grandmother   . Hypertension Paternal Grandmother    Social History   Tobacco Use  . Smoking status: Never Smoker  . Smokeless tobacco: Never Used  Substance Use Topics  . Alcohol use: Yes    Comment: Occasional   . Drug use: No   ROS See pertinent in HPI  Blood pressure 126/80, pulse 86, weight (!) 351 lb (159.2 kg).  GENERAL: Well-developed, well-nourished female in no acute distress.  ABDOMEN: Soft, nontender, nondistended. Obese PELVIC: Normal external female genitalia. Vagina is pink and rugated.  Normal discharge.  EXTREMITIES: No cyanosis, clubbing, or edema, 2+ distal pulses.  A/P 26 yo with recent exposure to gonorrhea - Cultures collected - Ceftriaxone IM given today - Rx azithromycin provided - patient will be contacted with results - RTC in 3 months for STI screen (HIV, RPR, Hep)

## 2017-06-15 NOTE — Patient Instructions (Signed)
Diet for Polycystic Ovarian Syndrome Polycystic ovary syndrome (PCOS) is a disorder of the chemical messengers (hormones) that regulate menstruation. The condition causes important hormones to be out of balance. PCOS can:  Make your periods irregular or stop.  Cause cysts to develop on the ovaries.  Make it difficult to get pregnant.  Stop your body from responding to the effects of insulin (insulin resistance), which can lead to obesity and diabetes.  Changing what you eat can help manage PCOS and improve your health. It can help you lose weight and improve the way your body uses insulin. What is my plan?  Eat breakfast, lunch, and dinner plus two snacks every day.  Include protein in each meal and snack.  Choose whole grains instead of products made with refined flour.  Eat a variety of foods.  Exercise regularly as told by your health care provider. What do I need to know about this eating plan? If you are overweight or obese, pay attention to how many calories you eat. Cutting down on calories can help you lose weight. Work with your health care provider or dietitian to figure out how many calories you need each day. What foods can I eat? Grains Whole grains, such as whole wheat. Whole-grain breads, crackers, cereals, and pasta. Unsweetened oatmeal, bulgur, barley, quinoa, or brown rice. Corn or whole-wheat flour tortillas. Vegetables  Lettuce. Spinach. Peas. Beets. Cauliflower. Cabbage. Broccoli. Carrots. Tomatoes. Squash. Eggplant. Herbs. Peppers. Onions. Cucumbers. Brussels sprouts. Fruits Berries. Bananas. Apples. Oranges. Grapes. Papaya. Mango. Pomegranate. Kiwi. Grapefruit. Cherries. Meats and Other Protein Sources Lean proteins, such as fish, chicken, beans, eggs, and tofu. Dairy Low-fat dairy products, such as skim milk, cheese sticks, and yogurt. Beverages Low-fat or fat-free drinks, such as water, low-fat milk, sugar-free drinks, and 100% fruit  juice. Condiments Ketchup. Mustard. Barbecue sauce. Relish. Low-fat or fat-free mayonnaise. Fats and Oils Olive oil or canola oil. Walnuts and almonds. The items listed above may not be a complete list of recommended foods or beverages. Contact your dietitian for more options. What foods are not recommended? Foods high in calories or fat. Fried foods. Sweets. Products made from refined white flour, including white bread, pastries, white rice, and pasta. The items listed above may not be a complete list of foods and beverages to avoid. Contact your dietitian for more information. This information is not intended to replace advice given to you by your health care provider. Make sure you discuss any questions you have with your health care provider. Document Released: 10/22/2015 Document Revised: 12/06/2015 Document Reviewed: 07/12/2014 Elsevier Interactive Patient Education  2018 Elsevier Inc.  

## 2017-06-16 LAB — GC/CHLAMYDIA PROBE AMP (~~LOC~~) NOT AT ARMC
Chlamydia: NEGATIVE
Neisseria Gonorrhea: POSITIVE — AB

## 2017-07-03 ENCOUNTER — Telehealth: Payer: Self-pay | Admitting: Radiology

## 2017-07-03 NOTE — Telephone Encounter (Signed)
Left message to return call to our office.  CRM Started ok to give message.   

## 2017-07-03 NOTE — Telephone Encounter (Signed)
Patient informed app made  

## 2017-07-03 NOTE — Telephone Encounter (Signed)
Copied from CRM [id number]. Topic: Appointment Scheduling - Scheduling Inquiry for Clinic >> Jul 03, 2017  3:00 PM Viviann SpareWhite, Selina wrote: Reason for CRM: Patient new patient appt has been reschedule for Jul 28, 2017 for her new patient appt. She is a [employer] employee and would like to know if Dr. Earlene PlaterWallace could see her on Dec 27th @ 7:40 am . She has to be the work at 8 am and do not have anymore PAL left, if so, will let her boss know that she will be a little late. Patient would like a call back @ [phone].

## 2017-07-06 ENCOUNTER — Ambulatory Visit: Payer: 59 | Admitting: Family Medicine

## 2017-07-09 ENCOUNTER — Ambulatory Visit (INDEPENDENT_AMBULATORY_CARE_PROVIDER_SITE_OTHER): Payer: 59 | Admitting: Family Medicine

## 2017-07-09 ENCOUNTER — Encounter: Payer: Self-pay | Admitting: Family Medicine

## 2017-07-09 ENCOUNTER — Telehealth: Payer: Self-pay | Admitting: Family Medicine

## 2017-07-09 DIAGNOSIS — E8881 Metabolic syndrome: Secondary | ICD-10-CM

## 2017-07-09 DIAGNOSIS — E88819 Insulin resistance, unspecified: Secondary | ICD-10-CM

## 2017-07-09 DIAGNOSIS — E538 Deficiency of other specified B group vitamins: Secondary | ICD-10-CM

## 2017-07-09 DIAGNOSIS — E559 Vitamin D deficiency, unspecified: Secondary | ICD-10-CM | POA: Diagnosis not present

## 2017-07-09 DIAGNOSIS — E282 Polycystic ovarian syndrome: Secondary | ICD-10-CM | POA: Diagnosis not present

## 2017-07-09 DIAGNOSIS — F341 Dysthymic disorder: Secondary | ICD-10-CM | POA: Diagnosis not present

## 2017-07-09 MED ORDER — METFORMIN HCL 1000 MG PO TABS: 1000.0000 mg | ORAL_TABLET | Freq: Two times a day (BID) | ORAL | 3 refills | Status: DC

## 2017-07-09 MED ORDER — DULAGLUTIDE 1.5 MG/0.5ML ~~LOC~~ SOAJ
SUBCUTANEOUS | 2 refills | Status: DC
Start: 2017-07-09 — End: 2017-08-26

## 2017-07-09 MED ORDER — CYANOCOBALAMIN 1000 MCG/ML IJ SOLN
1000.0000 ug | Freq: Once | INTRAMUSCULAR | Status: AC
  Administered 2017-07-09: 1000 ug via INTRAMUSCULAR

## 2017-07-09 MED ORDER — DULAGLUTIDE 0.75 MG/0.5ML ~~LOC~~ SOAJ: SUBCUTANEOUS | 0 refills | Status: DC

## 2017-07-09 MED ORDER — DULAGLUTIDE 0.75 MG/0.5ML ~~LOC~~ SOAJ: SUBCUTANEOUS | 2 refills | Status: DC

## 2017-07-09 MED ORDER — DULAGLUTIDE 1.5 MG/0.5ML ~~LOC~~ SOAJ: 1.0000 "pen " | SUBCUTANEOUS | 0 refills | Status: DC

## 2017-07-09 MED ORDER — PHENTERMINE HCL 37.5 MG PO TABS: 37.5000 mg | ORAL_TABLET | Freq: Every day | ORAL | 1 refills | Status: DC

## 2017-07-09 MED ORDER — CYANOCOBALAMIN 1000 MCG/ML IJ SOLN: INTRAMUSCULAR | 15 refills | Status: DC

## 2017-07-09 NOTE — Telephone Encounter (Signed)
I have sent in a new prescription in for Trulicity with correct instructions to the pharmacy.

## 2017-07-09 NOTE — Telephone Encounter (Signed)
Copied from CRM [id number]. Topic: Quick Communication - See Telephone Encounter >> Jul 09, 2017 10:05 AM Trula SladeWalter, Linda F wrote: CRM for notification. See Telephone encounter for:  07/09/17. Darl PikesSusan with [facility] [phone] would like a call back for a prescription clarification from Dr. Helane RimaErica Wallace or her assistant.

## 2017-07-09 NOTE — Patient Instructions (Signed)
We will move forward with the referral to Bariatric Surgery.  I ordered a future Vitamin D lab. Until you are able to have that drawn, please take vitamin D 5000 IU daily.  Follow up in 1 month.

## 2017-07-09 NOTE — Progress Notes (Signed)
Emily Frank is a 26 y.o. [facility]   Patient Care Team: Helane RimaWallace, Amaura Authier, DO as PCP - General (Family Medicine)   History of Present Illness:   HPI: See Assessment and Plan section for Problem Based Charting of issues discussed today.  Health Maintenance Due  Topic Date Due  . TETANUS/TDAP  03/15/2009   Depression screen PHQ 2/9 07/09/2017  Decreased Interest 2  Down, Depressed, Hopeless 2  PHQ - 2 Score 4  Tired, decreased energy 3  Change in appetite 3  Feeling bad or failure about yourself  1  Trouble concentrating 0  Moving slowly or fidgety/restless 0  Suicidal thoughts 0  Difficult doing work/chores Somewhat difficult   PMHx, SurgHx, SocialHx, Medications, and Allergies were reviewed in the Visit Navigator and updated as appropriate.   Past Medical History:  Diagnosis Date  . H/O chlamydia infection   . H/O gonorrhea   . Obesity   . Polycystic ovarian syndrome    History reviewed. No pertinent surgical history.  Family History  Problem Relation Age of Onset  . Hypertension Mother   . Diabetes Mother   . Hepatitis C Mother   . Renal Disease Mother   . Hypertension Father   . Hepatitis C Father   . Breast cancer Maternal Grandmother   . Brain cancer Maternal Grandmother   . Bone cancer Maternal Grandmother   . Hypertension Maternal Grandmother   . Renal Disease Maternal Grandmother   . Diabetes Maternal Grandmother   . Colon cancer Maternal Grandfather   . Breast cancer Paternal Grandmother   . Hypertension Paternal Grandmother    Social History   Tobacco Use  . Smoking status: Never Smoker  . Smokeless tobacco: Never Used  Substance Use Topics  . Alcohol use: Yes    Comment: Occasional   . Drug use: No   Current Medications and Allergies:   .  metFORMIN (GLUCOPHAGE) 500 MG tablet, Take 1 tablet by mouth daily  No Known Allergies   Review of Systems:   Pertinent items are noted in the HPI. Otherwise, ROS is  negative.  Vitals:   Vitals:   07/09/17 0743  BP: 140/82  Pulse: 97  Temp: 98.6 F (37 C)  TempSrc: Oral  SpO2: 99%  Weight: (!) 352 lb 3.2 oz (159.8 kg)  Height: 5\' 4"  (1.626 m)     Body mass index is 60.45 kg/m.  Physical Exam:   Physical Exam  Constitutional: She is oriented to person, place, and time. She appears well-developed and well-nourished. No distress.  HENT:  Head: Normocephalic and atraumatic.  Right Ear: External ear normal.  Left Ear: External ear normal.  Nose: Nose normal.  Mouth/Throat: Oropharynx is clear and moist.  Eyes: Conjunctivae and EOM are normal. Pupils are equal, round, and reactive to light.  Neck: Normal range of motion. Neck supple. No thyromegaly present.  Cardiovascular: Normal rate, regular rhythm, normal heart sounds and intact distal pulses.  Pulmonary/Chest: Effort normal and breath sounds normal.  Abdominal: Soft. Bowel sounds are normal.  Musculoskeletal: Normal range of motion.  Lymphadenopathy:    She has no cervical adenopathy.  Neurological: She is alert and oriented to person, place, and time.  Skin: Skin is warm and dry. Capillary refill takes less than 2 seconds.  Psychiatric: She has a normal mood and affect. Her behavior is normal.  Nursing note and vitals reviewed.  Assessment and Plan:   1. Morbid obesity (HCC) Contraindications to weight loss:  none. Patient readiness to commit to diet and activity changes: excellent. We will work on medication-assisted weight loss while awaiting bariatric surgery evaluation and intervention.   Plan: 1. Diagnostic studies to rule out secondary causes of obesity: completed by GYN. 2. General patient education:   Average sustained weight loss in long-term studies w/lifestyle interventions alone is 10-15 lb.  Importance of long-term maintenance tx in weight loss.  Use non-food self-rewards to reinforce behavior changes.  Elicit support from others; identify  saboteurs.  Practical target weight is usually around 2 BMI units below current weight. 3. Diet interventions:   Risks of dieting were reviewed, including fatigue, temporary hair loss, gallstone formation, gout, and with very low calorie diets, electrolyte abnormalities, nutrient inadequacies, and loss of lean body mass. 4. Exercise intervention:   Informal measures, e.g. taking stairs instead of elevator.  Formal exercise regimen options. 5. Other behavioral treatment: stress management. 6. Other treatment: Medication: SEE BELOW. NOTE: patient wants to be aggressive. Has tolerated Adipex in the past.  7. Patient to keep a weight log that we will review at follow up. 8. Follow up: 1 month and as needed.  - phentermine (ADIPEX-P) 37.5 MG tablet; Take 1 tablet (37.5 mg total) by mouth daily before breakfast. Instructed to start with 1/2 dose x 1 week. Dispense: 30 tablet; Refill: 1 - Dulaglutide (TRULICITY) 0.75 MG/0.5ML SOPN; 1.5 mg [place] q wk  Dispense: 4 pen; Refill: 2  2. PCOS (polycystic ovarian syndrome) As above.  - Dulaglutide (TRULICITY) 0.75 MG/0.5ML SOPN; 1.5 mg [place] q wk  Dispense: 4 pen; Refill: 2  3. Dysthymia Well controlled.   4. Insulin resistance The patient is asked to make an attempt to improve diet and exercise patterns to aid in medical management of this problem.  - metFORMIN (GLUCOPHAGE) 1000 MG tablet; Take 1 tablet (1,000 mg total) by mouth 2 (two) times daily with a meal.  Dispense: 180 tablet; Refill: 3 - Dulaglutide (TRULICITY) 0.75 MG/0.5ML SOPN; 1.5 mg [place] q wk  Dispense: 4 pen; Refill: 2  5. Vitamin D deficiency - VITAMIN D 25 Hydroxy (Vit-D Deficiency, Fractures); Future  6. B12 deficiency - cyanocobalamin (,VITAMIN B-12,) 1000 MCG/ML injection; 1000 mcg (1 mg) injection once per week for four weeks, followed by 1000 mcg injection once per month.  Dispense: 1 mL; Refill: 15   . Reviewed expectations re: course of current medical issues. . Discussed  self-management of symptoms. . Outlined signs and symptoms indicating need for more acute intervention. . Patient verbalized understanding and all questions were answered. Marland Kitchen. Health Maintenance issues including appropriate healthy diet, exercise, and smoking avoidance were discussed with patient. . See orders for this visit as documented in the electronic medical record. . Patient received an After Visit Summary.   Helane RimaErica Edlin Ford, DO [place] [place] 07/09/2017  Records requested if needed. Time spent with the patient: 30 minutes, of which >50% was spent in obtaining information about her symptoms, reviewing her previous labs, evaluations, and treatments, counseling her about her condition (please see the discussed topics above), and developing a plan to further investigate it; she had a number of questions which I addressed.

## 2017-07-09 NOTE — Telephone Encounter (Signed)
See note

## 2017-07-10 ENCOUNTER — Other Ambulatory Visit: Payer: Self-pay

## 2017-07-10 ENCOUNTER — Encounter: Payer: Self-pay | Admitting: Family Medicine

## 2017-07-10 ENCOUNTER — Telehealth: Payer: Self-pay | Admitting: Family Medicine

## 2017-07-10 ENCOUNTER — Ambulatory Visit: Payer: Self-pay

## 2017-07-10 MED ORDER — ONDANSETRON 4 MG PO TBDP: 4.0000 mg | ORAL_TABLET | Freq: Three times a day (TID) | ORAL | 0 refills | Status: DC | PRN

## 2017-07-10 NOTE — Telephone Encounter (Signed)
Copied from CRM [id number]. Topic: General - Other >> Jul 10, 2017  4:59 PM Clack, Princella PellegriniJessica D wrote: Reason for CRM: Pt ondansetron (ZOFRAN ODT) 4 MG disintegrating tablet [[id number]][[id number]] was called into the wrong pharmacy.  [facility] [place] [address] [phone] (Phone) [phone] (Fax)

## 2017-07-10 NOTE — Telephone Encounter (Signed)
Rx has been sent to correct pharmacy. 

## 2017-07-10 NOTE — Addendum Note (Signed)
Addended by: Donnamarie PoagHOMPSON, Jacelynn Hayton Y on: 07/10/2017 12:45 PM   Modules accepted: Orders

## 2017-07-10 NOTE — Telephone Encounter (Signed)
Patient sent my chart as well asking for us to call in script for zofran 4mg  to pharmacy. Ok to call in?

## 2017-07-10 NOTE — Telephone Encounter (Signed)
See note

## 2017-07-10 NOTE — Telephone Encounter (Signed)
Sent to pharmacy. My chart message sent to patient to let her know.

## 2017-07-10 NOTE — Telephone Encounter (Signed)
Added note to phone message. No need to forward this one.

## 2017-07-10 NOTE — Telephone Encounter (Signed)
Yes okay to call in

## 2017-07-10 NOTE — Telephone Encounter (Signed)
Pt called with c/o nausea and has vomited x 2 today. She is currently moving and found some Zofran 4 mg ODT and stated it helped her. She thinks it is from the Trulicity or the increased dose of Metformin. She is requesting a rx for Zofran 4 mg oral disintegrating tablet because she said it is what helped her before. Care advice given and routing to PCP. [facility] [address] Reason for Disposition . Taking prescription medication that could cause nausea (e.g., narcotics/opiates, antibiotics, OCPs, many others)  Answer Assessment - Initial Assessment Questions 1. NAUSEA SEVERITY: "How bad is the nausea?" (e.g., mild, moderate, severe; dehydration, weight loss)   - MILD: loss of appetite without change in eating habits   - MODERATE: decreased oral intake without significant weight loss, dehydration, or malnutrition   - SEVERE: inadequate caloric or fluid intake, significant weight loss, symptoms of dehydration     Lost 4 lbs in a day- moderate 2. ONSET: "When did the nausea begin?"     yesterday 3. VOMITING: "Any vomiting?" If so, ask: "How many times today?"     Threw up twice and found some nausea medicine Zofran 4 mg ODT. 4. RECURRENT SYMPTOM: "Have you had nausea before?" If so, ask: "When was the last time?" "What happened that time?"     no 5. CAUSE: "What do you think is causing the nausea?"     Either the Metformin or Trulicity 6. PREGNANCY: "Is there any chance you are pregnant?" (e.g., unprotected intercourse, missed birth control pill, broken condom)     n/a  Protocols used: NAUSEA-A-AH

## 2017-07-16 ENCOUNTER — Other Ambulatory Visit (HOSPITAL_COMMUNITY)
Admission: RE | Admit: 2017-07-16 | Discharge: 2017-07-16 | Disposition: A | Discharge status: Home / Self Care | Payer: No Typology Code available for payment source | Source: Ambulatory Visit | Attending: Obstetrics and Gynecology | Admitting: Obstetrics and Gynecology

## 2017-07-16 ENCOUNTER — Encounter: Payer: Self-pay | Admitting: Obstetrics and Gynecology

## 2017-07-16 ENCOUNTER — Ambulatory Visit (INDEPENDENT_AMBULATORY_CARE_PROVIDER_SITE_OTHER): Payer: No Typology Code available for payment source | Admitting: Obstetrics and Gynecology

## 2017-07-16 VITALS — BP 139/81 | HR 93 | Wt 344.6 lb

## 2017-07-16 DIAGNOSIS — Z202 Contact with and (suspected) exposure to infections with a predominantly sexual mode of transmission: Secondary | ICD-10-CM | POA: Insufficient documentation

## 2017-07-16 DIAGNOSIS — N898 Other specified noninflammatory disorders of vagina: Secondary | ICD-10-CM

## 2017-07-16 DIAGNOSIS — E282 Polycystic ovarian syndrome: Secondary | ICD-10-CM | POA: Diagnosis not present

## 2017-07-16 NOTE — Progress Notes (Signed)
GYNECOLOGY OFFICE VISIT NOTE  History:  27 y.o. G1P0010 here today for follow up. She denies any abnormal, bleeding, pelvic pain or other concerns. S/p recent gonorrhea exposure with treatment. She does have some itchy vaginal discharge, thinks it may be yeast infection and OTC meds have not helped.  Does not want birth control as she is ultimately attempting pregnancy, not actively trying but would be very happy about pregnancy. She is doing 30 min per day on the treadmill. Also planning to start Zumba. Diet has been not great, she is not eating a lot because of her new medication regimen.   Past Medical History:  Diagnosis Date  . Depression   . H/O chlamydia infection   . H/O gonorrhea   . Obesity   . Polycystic ovarian syndrome     History reviewed. No pertinent surgical history.   Current Outpatient Medications:  .  cyanocobalamin (,VITAMIN B-12,) 1000 MCG/ML injection, 1000 mcg (1 mg) injection once per week for four weeks, followed by 1000 mcg injection once per month., Disp: 1 mL, Rfl: 15 .  Dulaglutide (TRULICITY) 0.75 MG/0.5ML SOPN, 1.5 mg [place] q wk, Disp: 4 pen, Rfl: 2 .  Dulaglutide (TRULICITY) 1.5 MG/0.5ML SOPN, Inject 1 pen into the skin once a week., Disp: 2 pen, Rfl: 0 .  metFORMIN (GLUCOPHAGE) 1000 MG tablet, Take 1 tablet (1,000 mg total) by mouth 2 (two) times daily with a meal., Disp: 180 tablet, Rfl: 3 .  ondansetron (ZOFRAN ODT) 4 MG disintegrating tablet, Take 1 tablet (4 mg total) by mouth every 8 (eight) hours as needed for nausea or vomiting., Disp: 20 tablet, Rfl: 0 .  phentermine (ADIPEX-P) 37.5 MG tablet, Take 1 tablet (37.5 mg total) by mouth daily before breakfast., Disp: 30 tablet, Rfl: 1 .  Dulaglutide (TRULICITY) 0.75 MG/0.5ML SOPN, 0.75 mg [place] q wk x 2 wks , then increase to 1.5 mg [place] if tolerating, Disp: 2 pen, Rfl: 0 .  Dulaglutide (TRULICITY) 1.5 MG/0.5ML SOPN, Inject 1.5 mg into the skin  weekly., Disp: 4 pen, Rfl: 2  The following portions of  the patient's history were reviewed and updated as appropriate: allergies, current medications, past family history, past medical history, past social history, past surgical history and problem list.   Health Maintenance:  Last pap: 04/2017, wnl Last mammogram: n/a  Review of Systems:  Pertinent items noted in HPI and remainder of comprehensive ROS otherwise negative.   Objective:  Physical Exam BP 139/81   Pulse 93   Wt (!) 344 lb 9.6 oz (156.3 kg)   BMI 59.15 kg/m  CONSTITUTIONAL: Well-developed, well-nourished female in no acute distress.  HENT:  Normocephalic, atraumatic. External right and left ear normal. Oropharynx is clear and moist EYES: Conjunctivae and EOM are normal. Pupils are equal, round, and reactive to light. No scleral icterus.  NECK: Normal range of motion, supple, no masses SKIN: Skin is warm and dry. No rash noted. Not diaphoretic. No erythema. No pallor. NEUROLOGIC: Alert and oriented to person, place, and time. Normal reflexes, muscle tone coordination. No cranial nerve deficit noted. PSYCHIATRIC: Normal mood and affect. Normal behavior. Normal judgment and thought content. CARDIOVASCULAR: Normal heart rate noted RESPIRATORY: Effort and breath sounds normal, no problems with respiration noted ABDOMEN: Soft, no distention noted.   PELVIC: normal external appearing female genitalia with thin gray discharge at introitus MUSCULOSKELETAL: Normal range of motion. No edema noted.  Labs and Imaging No results found.  Assessment & Plan:  1. Vaginal discharge No improvement  with otc meds Wet prep sent today  2. Exposure to sexually transmitted disease (STD) S/p txt for chlamydia (04/2017) and gonorhea (06/2017) Reports completion of treatment and no further contact with source Return 1 month for f/u testing  3. Morbid obesity (HCC) Following with PCP, aggressive weight loss diet Has lost 11 pounds so far Referred to bariatric surgery  4. PCOS (polycystic  ovarian syndrome) See above No OCPs as she would like to get pregnant  Routine preventative health maintenance measures emphasized. Please refer to After Visit Summary for other counseling recommendations.   Return in about 4 weeks (around 08/13/2017) for Pelvic cultures.    [name], M.D. Attending Obstetrician & Gynecologist, [facility], [facility]

## 2017-07-16 NOTE — Addendum Note (Signed)
Addended by: Lear NgMARTIN, Rashid Whitenight L on: 07/16/2017 12:12 PM   Modules accepted: Orders

## 2017-07-17 LAB — CERVICOVAGINAL ANCILLARY ONLY
Bacterial vaginitis: POSITIVE — AB
Candida vaginitis: POSITIVE — AB
Trichomonas: NEGATIVE

## 2017-07-20 ENCOUNTER — Other Ambulatory Visit: Payer: Self-pay | Admitting: Obstetrics and Gynecology

## 2017-07-20 MED ORDER — METRONIDAZOLE 500 MG PO TABS: 500.0000 mg | ORAL_TABLET | Freq: Two times a day (BID) | ORAL | 0 refills | Status: DC

## 2017-07-20 MED ORDER — FLUCONAZOLE 150 MG PO TABS: 150.0000 mg | ORAL_TABLET | Freq: Once | ORAL | 1 refills | Status: AC

## 2017-07-20 NOTE — Progress Notes (Signed)
Script sent for flagyl and BV txt.

## 2017-07-28 ENCOUNTER — Telehealth: Payer: Self-pay | Admitting: Family Medicine

## 2017-07-28 ENCOUNTER — Ambulatory Visit: Payer: 59 | Admitting: Family Medicine

## 2017-07-28 NOTE — Telephone Encounter (Signed)
Copied from CRM [id number]. Topic: Appointment Scheduling - Scheduling Inquiry for Clinic >> Jul 28, 2017 12:49 PM Alexander BergeronBarksdale, Harvey B wrote: Reason for CRM: pt called b/c Dr. Earlene PlaterWallace told her to call the office if she needed to come in earlier for her appt tomorrow (@ 7am) Dr. 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 Earlene PlaterWallace choice

## 2017-07-28 NOTE — Telephone Encounter (Signed)
Please see note below and advise  

## 2017-07-28 NOTE — Telephone Encounter (Signed)
Left message for patient that we have added her onto the schedule at 7AM . Per Dr. Earlene PlaterWallace it is OK.

## 2017-07-29 ENCOUNTER — Ambulatory Visit (INDEPENDENT_AMBULATORY_CARE_PROVIDER_SITE_OTHER): Payer: No Typology Code available for payment source | Admitting: Family Medicine

## 2017-07-29 ENCOUNTER — Encounter: Payer: Self-pay | Admitting: Family Medicine

## 2017-07-29 VITALS — BP 118/72 | Temp 98.1°F | Ht 64.0 in | Wt 339.4 lb

## 2017-07-29 DIAGNOSIS — E282 Polycystic ovarian syndrome: Secondary | ICD-10-CM

## 2017-07-29 MED ORDER — PHENTERMINE HCL 37.5 MG PO TABS: 37.5000 mg | ORAL_TABLET | Freq: Every day | ORAL | 2 refills | Status: DC

## 2017-07-29 NOTE — Progress Notes (Signed)
Emily Frank is a 27 y.o. female is here for follow up.  History of Present Illness:   HPI: See Assessment and Plan section for Problem Based Charting of issues discussed today.  Depression screen PHQ 2/9 07/09/2017  Decreased Interest 2  Down, Depressed, Hopeless 2  PHQ - 2 Score 4  Tired, decreased energy 3  Change in appetite 3  Feeling bad or failure about yourself  1  Trouble concentrating 0  Moving slowly or fidgety/restless 0  Suicidal thoughts 0  Difficult doing work/chores Somewhat difficult   PMHx, SurgHx, SocialHx, FamHx, Medications, and Allergies were reviewed in the Visit Navigator and updated as appropriate.   Patient Active Problem List   Diagnosis Date Noted  . PCOS (polycystic ovarian syndrome) 04/29/2017  . Morbid obesity (HCC) 04/29/2017  . Amenorrhea 04/29/2017  . H/O gonorrhea   . H/O chlamydia infection    Social History   Tobacco Use  . Smoking status: Never Smoker  . Smokeless tobacco: Never Used  Substance Use Topics  . Alcohol use: Yes    Comment: Occasional   . Drug use: No   Current Medications and Allergies:   .  cyanocobalamin (,VITAMIN B-12,) 1000 MCG/ML injection, 1000 mcg (1 mg) injection once per week for four weeks, followed by 1000 mcg injection once per month., Disp: 1 mL, Rfl: 15 .  Dulaglutide (TRULICITY) 0.75 MG/0.5ML SOPN, 0.75 mg [place] q wk x 2 wks , then increase to 1.5 mg [place] if tolerating, Disp: 2 pen, Rfl: 0 .  Dulaglutide (TRULICITY) 1.5 MG/0.5ML SOPN, Inject 1 pen into the skin once a week., Disp: 2 pen, Rfl: 0 .  Dulaglutide (TRULICITY) 1.5 MG/0.5ML SOPN, Inject 1.5 mg into the skin [place] weekly., Disp: 4 pen, Rfl: 2 .  metFORMIN (GLUCOPHAGE) 1000 MG tablet, Take 1 tablet (1,000 mg total) by mouth 2 (two) times daily with a meal., Disp: 180 tablet, Rfl: 3 .  phentermine (ADIPEX-P) 37.5 MG tablet, Take 1 tablet (37.5 mg total) by mouth daily before breakfast., Disp: 30 tablet, Rfl: 1  No Known Allergies   Review of  Systems   Pertinent items are noted in the HPI. Otherwise, ROS is negative.  Vitals:   Vitals:   07/29/17 0713  BP: 118/72  Temp: 98.1 F (36.7 C)  TempSrc: Oral  Weight: (!) 339 lb 6.4 oz (154 kg)  Height: 5\' 4"  (1.626 m)     Body mass index is 58.26 kg/m.   Physical Exam:   Physical Exam  Constitutional: She is oriented to person, place, and time. She appears well-developed and well-nourished. No distress.  HENT:  Head: Normocephalic and atraumatic.  Right Ear: External ear normal.  Left Ear: External ear normal.  Nose: Nose normal.  Mouth/Throat: Oropharynx is clear and moist.  Eyes: Conjunctivae and EOM are normal. Pupils are equal, round, and reactive to light.  Neck: Normal range of motion. Neck supple. No thyromegaly present.  Cardiovascular: Normal rate, regular rhythm, normal heart sounds and intact distal pulses.  Pulmonary/Chest: Effort normal and breath sounds normal.  Abdominal: Soft. Bowel sounds are normal.  Musculoskeletal: Normal range of motion.  Lymphadenopathy:    She has no cervical adenopathy.  Neurological: She is alert and oriented to person, place, and time.  Skin: Skin is warm and dry. Capillary refill takes less than 2 seconds.  Psychiatric: She has a normal mood and affect. Her behavior is normal.  Nursing note and vitals reviewed.   Assessment and Plan:   1.  PCOS (polycystic ovarian syndrome) See below.  Patient is not on OCPs per her own choice.  She understands the risk versus benefit of medications below.  2. Morbid obesity (HCC) Patient has done very well in the past 3 weeks on the Trulicity and phentermine regimen.  She is exercising nearly daily.  She is still working on [employer] Pepsihealthy food choices, but has done a good job of increasing vegetables in her diet.  Her weight loss is 13 pounds over the past 3 weeks.  She is tolerating the phentermine well.  She has had some mild nausea when taking the Trulicity but does take that with B12  injections as well.  Advised her to alternate those injections.  Patient has not been contacted by bariatric surgery yet.  We will help to follow up on that.  Her ultimate goal is to have bariatric surgery.  She will follow-up in 1 month for weight check and accountability.  On month 2, she will follow-up with our PA who is also a Museum/gallery exhibitions officerregistered dietitian.  On month 3, she will follow-up with me.  - phentermine (ADIPEX-P) 37.5 MG tablet; Take 1 tablet (37.5 mg total) by mouth daily before breakfast.  Dispense: 30 tablet; Refill: 2   . Reviewed expectations re: course of current medical issues. . Discussed self-management of symptoms. . Outlined signs and symptoms indicating need for more acute intervention. . Patient verbalized understanding and all questions were answered. Marland Kitchen. Health Maintenance issues including appropriate healthy diet, exercise, and smoking avoidance were discussed with patient. . See orders for this visit as documented in the electronic medical record. . Patient received an After Visit Summary.   Helane RimaErica Felesha Moncrieffe, DO [place] [place] 07/29/2017  Future Appointments  Date Time Provider Department Center  08/17/2017  8:15 AM Constant, Gigi GinPeggy, MD [facility] None  08/26/2017  7:20 AM Helane RimaWallace, Danyeal Akens, DO [facility]

## 2017-07-30 ENCOUNTER — Ambulatory Visit: Payer: 59 | Admitting: Family Medicine

## 2017-07-30 ENCOUNTER — Encounter: Payer: Self-pay | Admitting: Family Medicine

## 2017-07-31 ENCOUNTER — Ambulatory Visit (INDEPENDENT_AMBULATORY_CARE_PROVIDER_SITE_OTHER): Payer: No Typology Code available for payment source | Admitting: Family Medicine

## 2017-07-31 ENCOUNTER — Encounter: Payer: Self-pay | Admitting: Family Medicine

## 2017-07-31 VITALS — BP 124/80 | HR 90 | Temp 98.2°F | Ht 64.0 in | Wt 337.8 lb

## 2017-07-31 DIAGNOSIS — R110 Nausea: Secondary | ICD-10-CM

## 2017-07-31 DIAGNOSIS — R1110 Vomiting, unspecified: Secondary | ICD-10-CM

## 2017-07-31 LAB — POCT URINE PREGNANCY: PREG TEST UR: NEGATIVE

## 2017-07-31 MED ORDER — PROMETHAZINE HCL 25 MG PO TABS
25.0000 mg | ORAL_TABLET | Freq: Three times a day (TID) | ORAL | 0 refills | Status: DC | PRN
Start: 2017-07-31 — End: 2017-10-19

## 2017-07-31 MED ORDER — PROMETHAZINE HCL 25 MG/ML IJ SOLN
25.0000 mg | Freq: Once | INTRAMUSCULAR | Status: AC
  Administered 2017-07-31: 25 mg via INTRAMUSCULAR

## 2017-07-31 NOTE — Progress Notes (Signed)
    Subjective:  Emily Frank is a 27 y.o. female who presents today for same-day appointment with a chief complaint of vomiting.   HPI:  Nausea/Vomiting, acute issue Started this morning.  Worsened over the last few hours.  Patient was in her usual state of health this morning.  Around 9 AM she started noticing some nausea.  She preemptively took Zofran which did not significantly seem to help.  Soon afterwards an episode of vomiting.  No hematemesis.  Emesis appeared to be consistent with stomach contents.  She had one additional episode a little while later.  No fevers or chills.  Mild abdominal pain that she associates with her episodes of vomiting.  No constipation or diarrhea.  Had a normal bowel movement yesterday however notes that he had a bit more mucus in it.  She has been on Trulicity 1.5 mg weekly for the last couple of weeks.  She took her dose of Trulicity yesterday.  No other recent medication changes.  She took a home pregnancy test which was negative.  No fevers or chills. No obvious alleviating or aggravating factors.   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  No obvious sick contact.  No questionable food intake.  She has not tried taking any food or drink since her episodes of vomiting.   ROS: Per HPI  PMH: She reports that  has never smoked. she has never used smokeless tobacco. She reports that she drinks alcohol. She reports that she does not use drugs.  Objective:  Physical Exam: BP 124/80 (BP Location: Left Arm, Patient Position: Sitting, Cuff Size: Large)   Pulse 90   Temp 98.2 F (36.8 C) (Oral)   Ht 5\' 4"  (1.626 m)   Wt (!) 337 lb 12.8 oz (153.2 kg)   SpO2 99%   BMI 57.98 kg/m   Gen: NAD, resting comfortably HEENT: MMM. CV: RRR with no murmurs appreciated Pulm: NWOB, CTAB with no crackles, wheezes, or rhonchi GI: Normal bowel sounds present. Soft, Nontender, Nondistended.  Assessment/Plan:  Nausea with  vomiting Her abdominal exam is largely benign and her vitals are within normal limits-do not think she has any acute process and does not need imaging at this point.  Urine pregnancy test negative.  Symptoms likely secondary to medication side effect-she took her increased dose of Trulicity yesterday.  Viral gastritis is also a possibility however she does not have any other symptoms consistent with a viral infection.  We will treat with supportive care.  Patient was given 25 mg of IM Phenergan while in office with improvement in her symptoms. Encouraged good oral hydration. Will send in a prescription for phenergan.  Recommended that patient stop or decrease her dose of Trulicity, however she declined this.  Discussed warning signs and reasons to return to care.  She has follow-up with her PCP scheduled in approximately 3-4 weeks.  Katina Degreealeb M. Jimmey RalphParker, MD 07/31/2017 2:02 PM

## 2017-07-31 NOTE — Patient Instructions (Signed)
I think you are most likely having a recation to your trulicity.  Use the phenergan as needed.  Let me know if you have severe abdominal pain, fevers, chills, or inability to take oral food or water.  Take care, Dr. Jimmey RalphParker

## 2017-08-06 ENCOUNTER — Encounter: Payer: Self-pay | Admitting: Obstetrics and Gynecology

## 2017-08-06 ENCOUNTER — Other Ambulatory Visit: Payer: Self-pay | Admitting: Obstetrics and Gynecology

## 2017-08-07 ENCOUNTER — Telehealth: Payer: Self-pay | Admitting: Family Medicine

## 2017-08-07 ENCOUNTER — Other Ambulatory Visit: Payer: Self-pay

## 2017-08-07 DIAGNOSIS — Z9884 Bariatric surgery status: Secondary | ICD-10-CM

## 2017-08-07 MED ORDER — FLUCONAZOLE 150 MG PO TABS: 150.0000 mg | ORAL_TABLET | Freq: Once | ORAL | 0 refills | Status: AC

## 2017-08-07 MED ORDER — METRONIDAZOLE 500 MG PO TABS: 500.0000 mg | ORAL_TABLET | Freq: Two times a day (BID) | ORAL | 0 refills | Status: AC

## 2017-08-07 NOTE — Addendum Note (Signed)
Addended by: Dorian PodWHEELEY, JAMIE J on: 08/07/2017 03:28 PM   Modules accepted: Orders

## 2017-08-07 NOTE — Telephone Encounter (Signed)
Copied from CRM [id number]. Topic: Referral - Status >> Aug 06, 2017  3:44 PM Oneal GroutSebastian, Jennifer S wrote: Reason for CRM: [facility]  ---------------------------------------------------------------------------------------------- Per above CRM, Patient was inquiring about referral. I see no referral in the system from our office, and none regarding [facility]. Please advise.

## 2017-08-07 NOTE — Telephone Encounter (Signed)
Order has been placed. Will send to [place] schedule.

## 2017-08-07 NOTE — Telephone Encounter (Signed)
Please place referral. I was under the impression that it had been placed by GYN. Okay to place now.

## 2017-08-07 NOTE — Telephone Encounter (Signed)
OK for referral?  

## 2017-08-13 ENCOUNTER — Ambulatory Visit: Payer: No Typology Code available for payment source | Admitting: Obstetrics and Gynecology

## 2017-08-17 ENCOUNTER — Ambulatory Visit (INDEPENDENT_AMBULATORY_CARE_PROVIDER_SITE_OTHER): Payer: No Typology Code available for payment source | Admitting: Obstetrics and Gynecology

## 2017-08-17 ENCOUNTER — Encounter: Payer: Self-pay | Admitting: Obstetrics and Gynecology

## 2017-08-17 VITALS — BP 112/73 | HR 94 | Wt 342.0 lb

## 2017-08-17 DIAGNOSIS — N898 Other specified noninflammatory disorders of vagina: Secondary | ICD-10-CM

## 2017-08-17 DIAGNOSIS — N912 Amenorrhea, unspecified: Secondary | ICD-10-CM

## 2017-08-17 DIAGNOSIS — Z3202 Encounter for pregnancy test, result negative: Secondary | ICD-10-CM

## 2017-08-17 DIAGNOSIS — Z113 Encounter for screening for infections with a predominantly sexual mode of transmission: Secondary | ICD-10-CM

## 2017-08-17 LAB — POCT URINE PREGNANCY: Preg Test, Ur: NEGATIVE

## 2017-08-17 NOTE — Progress Notes (Signed)
Patient here for nurse visit and not seen by a provider  I have reviewed the chart and agree with nursing staff's documentation of this patient's encounter.  Catalina AntiguaPeggy Adarsh Mundorf, MD 08/17/2017 8:35 AM

## 2017-08-17 NOTE — Addendum Note (Signed)
Addended by: Hamilton CapriBURCH, Damara Klunder J on: 08/17/2017 08:41 AM   Modules accepted: Orders

## 2017-08-18 LAB — HEPATITIS C ANTIBODY: Hep C Virus Ab: 0.1 s/co ratio (ref 0.0–0.9)

## 2017-08-18 LAB — CERVICOVAGINAL ANCILLARY ONLY
Bacterial vaginitis: NEGATIVE
CANDIDA VAGINITIS: NEGATIVE
CHLAMYDIA, DNA PROBE: NEGATIVE
Neisseria Gonorrhea: NEGATIVE
Trichomonas: NEGATIVE

## 2017-08-18 LAB — RPR: RPR Ser Ql: NONREACTIVE

## 2017-08-18 LAB — HIV ANTIBODY (ROUTINE TESTING W REFLEX): HIV Screen 4th Generation wRfx: NONREACTIVE

## 2017-08-18 LAB — HEPATITIS B SURFACE ANTIGEN: Hepatitis B Surface Ag: NEGATIVE

## 2017-08-20 ENCOUNTER — Encounter (INDEPENDENT_AMBULATORY_CARE_PROVIDER_SITE_OTHER): Payer: Self-pay

## 2017-08-24 ENCOUNTER — Encounter: Payer: Self-pay | Admitting: Obstetrics and Gynecology

## 2017-08-26 ENCOUNTER — Ambulatory Visit (INDEPENDENT_AMBULATORY_CARE_PROVIDER_SITE_OTHER): Payer: No Typology Code available for payment source | Admitting: Family Medicine

## 2017-08-26 ENCOUNTER — Encounter: Payer: Self-pay | Admitting: Family Medicine

## 2017-08-26 DIAGNOSIS — F410 Panic disorder [episodic paroxysmal anxiety] without agoraphobia: Secondary | ICD-10-CM | POA: Diagnosis not present

## 2017-08-26 DIAGNOSIS — E538 Deficiency of other specified B group vitamins: Secondary | ICD-10-CM | POA: Diagnosis not present

## 2017-08-26 MED ORDER — ALPRAZOLAM 1 MG PO TABS: 1.0000 mg | ORAL_TABLET | Freq: Every day | ORAL | 0 refills | Status: DC | PRN

## 2017-08-26 MED ORDER — CYANOCOBALAMIN 1000 MCG/ML IJ SOLN
1000.0000 ug | Freq: Once | INTRAMUSCULAR | Status: AC
  Administered 2017-08-26: 1000 ug via INTRAMUSCULAR

## 2017-08-26 MED ORDER — DULAGLUTIDE 1.5 MG/0.5ML ~~LOC~~ SOAJ: SUBCUTANEOUS | 2 refills | Status: DC

## 2017-08-26 NOTE — Progress Notes (Signed)
[name] is a 27 y.o. female is here for follow up.  History of Present Illness:   HPI:  Last A/P:  1. PCOS (polycystic ovarian syndrome) See below.  Patient is not on OCPs per her own choice.  She understands the risk versus benefit of medications below.  2. Morbid obesity (HCC) Patient has done very well in the past 3 weeks on the Trulicity and phentermine regimen.  She is exercising nearly daily.  She is still working on [employer]healthy food choices, but has done a good job of increasing vegetables in her diet.  Her weight loss is 13 pounds over the past 3 weeks.  She is tolerating the phentermine well.  She has had some mild nausea when taking the Trulicity but does take that with B12 injections as well.  Advised her to alternate those injections.  Patient has not been contacted by bariatric surgery yet.  We will help to follow up on that.  Her ultimate goal is to have bariatric surgery.  She will follow-up in 1 month for weight check and accountability.  On month 2, she will follow-up with our PA who is also a Museum/gallery exhibitions officerregistered dietitian.  On month 3, she will follow-up with me.  - phentermine (ADIPEX-P) 37.5 MG tablet; Take 1 tablet (37.5 mg total) by mouth daily before breakfast.  Dispense: 30 tablet; Refill: 2  Since our last visit, she is frustrated that she has lost more weight.  She does feel like the medications are still helpful.  She has not been exercising as much.  She did receive her bariatric surgery packet yesterday.  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  There are no preventive care reminders to display for this patient.   Depression screen PHQ 2/9 07/09/2017  Decreased Interest 2  Down, Depressed, Hopeless 2  PHQ - 2 Score 4  Tired, decreased energy 3  Change in appetite 3  Feeling bad or failure about yourself  1  Trouble concentrating 0  Moving slowly or fidgety/restless 0    Suicidal thoughts 0  Difficult doing work/chores Somewhat difficult   PMHx, SurgHx, SocialHx, FamHx, Medications, and Allergies were reviewed in the Visit Navigator and updated as appropriate.   Patient Active Problem List   Diagnosis Date Noted  . PCOS (polycystic ovarian syndrome) 04/29/2017  . Morbid obesity (HCC) 04/29/2017  . Amenorrhea 04/29/2017  . H/O gonorrhea   . H/O chlamydia infection    Social History   Tobacco Use  . Smoking status: Never Smoker  . Smokeless tobacco: Never Used  Substance Use Topics  . Alcohol use: Yes    Comment: Occasional   . Drug use: No   Current Medications and Allergies:   .  cyanocobalamin (,VITAMIN B-12,) 1000 MCG/ML injection, 1000 mcg (1 mg) injection once per week for four weeks, followed by 1000 mcg injection once per month., Disp: 1 mL, Rfl: 15 .  Dulaglutide (TRULICITY) 1.5 MG/0.5ML SOPN, Inject 1.5 mg into the skin [place] weekly., Disp: 4 pen, Rfl: 2 .  metFORMIN (GLUCOPHAGE) 1000 MG tablet, Take 1 tablet (1,000 mg total) by mouth 2 (two) times daily with a meal., Disp: 180 tablet, Rfl: 3 .  ondansetron (ZOFRAN ODT) 4 MG disintegrating tablet, Take 1 tablet (4 mg total) by mouth every 8 (eight) hours as needed for nausea or vomiting., Disp: 20 tablet, Rfl: 0 .  phentermine (  ADIPEX-P) 37.5 MG tablet, Take 1 tablet (37.5 mg total) by mouth daily before breakfast., Disp: 30 tablet, Rfl: 2 .  promethazine (PHENERGAN) 25 MG tablet, Take 1 tablet (25 mg total) by mouth every 8 (eight) hours as needed for nausea or vomiting., Disp: 20 tablet, Rfl: 0  No Known Allergies   Review of Systems   Pertinent items are noted in the HPI. Otherwise, ROS is negative.  Vitals:   Vitals:   08/26/17 0728  BP: 118/74  Pulse: 96  Temp: 98.3 F (36.8 C)  TempSrc: Oral  SpO2: 98%  Weight: (!) 336 lb 12.8 oz (152.8 kg)     Body mass index is 57.81 kg/m.   Physical Exam:   Physical Exam  Constitutional: She is oriented to person, place, and  time. She appears well-developed and well-nourished. No distress.  HENT:  Head: Normocephalic and atraumatic.  Right Ear: External ear normal.  Left Ear: External ear normal.  Nose: Nose normal.  Mouth/Throat: Oropharynx is clear and moist.  Eyes: Conjunctivae and EOM are normal. Pupils are equal, round, and reactive to light.  Neck: Normal range of motion. Neck supple. No thyromegaly present.  Cardiovascular: Normal rate, regular rhythm, normal heart sounds and intact distal pulses.  Pulmonary/Chest: Effort normal and breath sounds normal.  Abdominal: Soft. Bowel sounds are normal.  Musculoskeletal: Normal range of motion.  Lymphadenopathy:    She has no cervical adenopathy.  Neurological: She is alert and oriented to person, place, and time.  Skin: Skin is warm and dry. Capillary refill takes less than 2 seconds.  Psychiatric: She has a normal mood and affect. Her behavior is normal.  Nursing note and vitals reviewed.    Assessment and Plan:   Emily Frank was seen today for follow-up.  Diagnoses and all orders for this visit:  Morbid obesity (HCC) Comments: The patient is asked to make an attempt to improve diet and exercise patterns to aid in medical management of this problem.  Orders: -     Dulaglutide (TRULICITY) 1.5 MG/0.5ML SOPN; Inject 1.5 mg into the skin [place] weekly.  B12 deficiency -     cyanocobalamin ((VITAMIN B-12)) injection 1,000 mcg  Panic attacks Comments: New.  Patient complains of having panic attacks a few times.  Her mother died a few months ago and she can generally control this issue.  She asks for something to help.  She has taken the below medication in the past and understands that it has abuse potential.  She understands it is to be used sparingly. Orders: -     ALPRAZolam (XANAX) 1 MG tablet; Take 1 tablet (1 mg total) by mouth daily as needed for anxiety.   . Reviewed expectations re: course of current medical issues. . Discussed self-management of  symptoms. . Outlined signs and symptoms indicating need for more acute intervention. . Patient verbalized understanding and all questions were answered. Marland Kitchen Health Maintenance issues including appropriate healthy diet, exercise, and smoking avoidance were discussed with patient. . See orders for this visit as documented in the electronic medical record. . Patient received an After Visit Summary.  Helane Rima, DO [place] [place] 08/26/2017  Future Appointments  Date Time Provider Department Center  09/23/2017  1:30 PM Jarold Motto, [place] [facility]

## 2017-09-10 ENCOUNTER — Telehealth: Payer: No Typology Code available for payment source | Admitting: Family

## 2017-09-10 DIAGNOSIS — H60331 Swimmer's ear, right ear: Secondary | ICD-10-CM | POA: Diagnosis not present

## 2017-09-10 MED ORDER — CIPROFLOXACIN-HYDROCORTISONE 0.2-1 % OT SUSP: 3.0000 [drp] | Freq: Two times a day (BID) | OTIC | 0 refills | Status: DC

## 2017-09-10 MED ORDER — CIPROFLOXACIN HCL 500 MG PO TABS: 500.0000 mg | ORAL_TABLET | Freq: Two times a day (BID) | ORAL | 0 refills | Status: DC

## 2017-09-10 MED ORDER — NEOMYCIN-POLYMYXIN-HC 3.5-10000-1 OT SOLN
4.0000 [drp] | Freq: Four times a day (QID) | OTIC | 0 refills | Status: DC
Start: 2017-09-10 — End: 2017-09-11

## 2017-09-10 NOTE — Progress Notes (Signed)
Thank you for the details you included in the comment boxes. Those details are very helpful in determining the best course of treatment for you and help us to provide the best care.  E Visit for Swimmer's Ear  We are sorry that you are not feeling well. Here is how we plan to help!  Based on what you have shared with me it looks like you have swimmers ear. Swimmer's ear is a redness or swelling, irritation, or infection of your outer ear canal.  These symptoms usually occur within a few days of swimming.  Your ear canal is a tube that goes from the opening of the ear to the eardrum.  When water stays in your ear canal, germs can grow.  This is a painful condition that often happens to children and swimmers of all ages.  It is not contagious and oral antibiotics are not required to treat uncomplicated swimmer's ear.  The usual symptoms include: Itching inside the ear, Redness or a sense of swelling in the ear, Pain when the ear is tugged on when pressure is placed on the ear, Pus draining from the infected ear. and I have prescribed: Ciprofloxin 0.2% and hydrocortisone 1% otic suspension 3 drops in affected ears twice daily until completed  Cipro 500mg , one tablet my mouth twice a day for 10 days  In certain cases swimmer's ear may progress to a more serious bacterial infection of the middle or inner ear.  If you have a fever 102 and up and significantly worsening symptoms, this could indicate a more serious infection moving to the middle/inner and needs face to face evaluation in an office by a provider.  Your symptoms should improve over the next 3 days and should resolve in about 7 days.  HOME CARE:   Wash your hands frequently.  Do not place the tip of the bottle on your ear or touch it with your fingers.  You can take Acetominophen 650 mg every 4-6 hours as needed for pain.  If pain is severe or moderate, you can apply a heating pad (set on low) or hot water bottle (wrapped in a towel) to  outer ear for 20 minutes.  This will also increase drainage.  Avoid ear plugs  Do not use Q-tips  After showers, help the water run out by tilting your head to one side.  GET HELP RIGHT AWAY IF:   Fever is over 102.2 degrees.  You develop progressive ear pain or hearing loss.  Ear symptoms persist longer than 3 days after treatment.  MAKE SURE YOU:   Understand these instructions.  Will watch your condition.  Will get help right away if you are not doing well or get worse.  TO PREVENT SWIMMER'S EAR:  Use a bathing cap or custom fitted swim molds to keep your ears dry.  Towel off after swimming to dry your ears.  Tilt your head or pull your earlobes to allow the water to escape your ear canal.  If there is still water in your ears, consider using a hairdryer on the lowest setting.  Thank you for choosing an e-visit. Your e-visit answers were reviewed by a board certified advanced clinical practitioner to complete your personal care plan. Depending upon the condition, your plan could have included both over the counter or prescription medications. Please review your pharmacy choice. Be sure that the pharmacy you have chosen is open so that you can pick up your prescription now.  If there is a problem  you may message your provider in MyChart to have the prescription routed to another pharmacy. Your safety is important to [place]. If you have drug allergies check your prescription carefully.  For the next 24 hours, you can use MyChart to ask questions about today's visit, request a non-urgent call back, or ask for a work or school excuse from your e-visit provider. You will get an email in the next two days asking about your experience. I hope that your e-visit has been valuable and will speed your recovery.

## 2017-09-10 NOTE — Addendum Note (Signed)
Addended by: Beau FannyWITHROW, JOHN C on: 09/10/2017 10:36 AM   Modules accepted: Orders

## 2017-09-10 NOTE — Progress Notes (Signed)
Too expensive at pharmacy: I have prescribed: Neomycin 0.35%, polymyxin B 10,000 units/mL, and hydrocortisone 0,5% otic solution 4 drops in affected ears four times a day until completed  Constance HawJohn Mishaal Lansdale, DNP, FNP-BC [facility] E[facility] Team

## 2017-09-11 ENCOUNTER — Encounter: Payer: Self-pay | Admitting: Family Medicine

## 2017-09-11 ENCOUNTER — Ambulatory Visit (INDEPENDENT_AMBULATORY_CARE_PROVIDER_SITE_OTHER): Payer: No Typology Code available for payment source | Admitting: Family Medicine

## 2017-09-11 ENCOUNTER — Ambulatory Visit: Payer: Self-pay

## 2017-09-11 VITALS — BP 120/74 | HR 98 | Temp 98.7°F | Ht 64.0 in | Wt 332.4 lb

## 2017-09-11 DIAGNOSIS — H6641 Suppurative otitis media, unspecified, right ear: Secondary | ICD-10-CM

## 2017-09-11 MED ORDER — DICLOFENAC SODIUM 75 MG PO TBEC: 75.0000 mg | DELAYED_RELEASE_TABLET | Freq: Two times a day (BID) | ORAL | 0 refills | Status: DC

## 2017-09-11 MED ORDER — AMOXICILLIN 875 MG PO TABS: 875.0000 mg | ORAL_TABLET | Freq: Two times a day (BID) | ORAL | 0 refills | Status: DC

## 2017-09-11 MED ORDER — KETOROLAC TROMETHAMINE 60 MG/2ML IM SOLN
60.0000 mg | Freq: Once | INTRAMUSCULAR | Status: AC
Start: 2017-09-11 — End: 2017-09-11
  Administered 2017-09-11: 60 mg via INTRAMUSCULAR

## 2017-09-11 NOTE — Telephone Encounter (Signed)
Noted  

## 2017-09-11 NOTE — Progress Notes (Signed)
    Subjective:  Emily Frank is a 27 y.o. female who presents today for same-day appointment with a chief complaint of ear pain.   HPI:  Ear Pain, Acute Issue Symptoms started 3 days ago.  Pain located in her right ear.  Yesterday she had a ED visit and was diagnosed with swimmer's ear.  She was prescribed oral ciprofloxacin as well as ciprofloxacin-hydrocortisone eardrops.  Patient states that neither of these medications of help.  Eardrops hurt every time she puts those in her ear.  She has had associated ear drainage, hearing loss, and swelling around her ear.  No obvious precipitating events.  No history of water exposure.  She has a history of recurrent otitis media.  No fevers or chills.  No other obvious alleviating or aggravating factors.  ROS: Per HPI  PMH: She reports that  has never smoked. she has never used smokeless tobacco. She reports that she drinks alcohol. She reports that she does not use drugs.  Objective:  Physical Exam: BP 120/74 (BP Location: Left Arm, Patient Position: Sitting, Cuff Size: Large)   Pulse 98   Temp 98.7 F (37.1 C) (Oral)   Ht 5\' 4"  (1.626 m)   Wt (!) 332 lb 6.4 oz (150.8 kg)   SpO2 97%   BMI 57.06 kg/m   HEENT: -Left ear: EAC normal.  TM clear. -Right ear: Right EAC obscured with purulent debris.  Small amount of swelling noted to the ear canal.  Small amount of TM visualized with significant erythema.  Pinna tender to palpation and movement.  No appreciable lymphadenopathy.  Assessment/Plan:  Right Otitis Media Patient with purulent drainage that likely represents a spontaneous rupture of her TM.  This was not directly visualized today however would explain why her eardrops caused her significant amount of pain.  We will switch her antibiotics to cover gram positives-the most likely cause for her otitis media.  Start amoxicillin 875 mg twice daily for 10 days.  Patient in significant amount of pain today.  Will give 60 mg IM Toradol today.   Sent in a prescription for diclofenac 75 mg twice daily as needed with instruction to start tomorrow.  Recommended that she could also use Tylenol as needed.  She does have a fair amount of swelling in her EAC, however does not have any other signs of cellulitis.  No signs of systemic infection.  Strict return precautions reviewed.  If symptoms worsen or do not improve despite above treatment, will need urgent referral to ENT to rule out cholesteatoma or other complications of AOM.  Katina Degreealeb M. Jimmey RalphParker, MD 09/11/2017 10:18 AM

## 2017-09-11 NOTE — Telephone Encounter (Signed)
Pt. Reports she had an e-visit yesterday and was prescribed ear drops, but her pain has gotten worse and she now has swelling to the right side of her face. Appointment has already been made for this morning.  Reason for Disposition . Earache  (Exceptions: brief ear pain of < 60 minutes duration, earache occurring during air travel  Answer Assessment - Initial Assessment Questions 1. LOCATION: "Which ear is involved?"     Right ear 2. ONSET: "When did the ear start hurting"      Started 2 days ago 3. SEVERITY: "How bad is the pain?"  (Scale 1-10; mild, moderate or severe)   - MILD (1-3): doesn't interfere with normal activities    - MODERATE (4-7): interferes with normal activities or awakens from sleep    - SEVERE (8-10): excruciating pain, unable to do any normal activities      Severe 4. URI SYMPTOMS: " Do you have a runny nose or cough?"    No 5. FEVER: "Do you have a fever?" If so, ask: "What is your temperature, how was it measured, and when did it start?"     99 6. CAUSE: "Have you been swimming recently?", "How often do you use Q-TIPS?", "Have you had any recent air travel or scuba diving?"     No 7. OTHER SYMPTOMS: "Do you have any other symptoms?" (e.g., headache, stiff neck, dizziness, vomiting, runny nose, decreased hearing)     Hurts down into her neck, swelling to right side of face 8. PREGNANCY: "Is there any chance you are pregnant?" "When was your last menstrual period?"     No  Protocols used: EARACHE-A-AH

## 2017-09-11 NOTE — Patient Instructions (Addendum)
Stop the cipro and your ear drops.   Start the amoxicilin today.  Start the diclofenac tomorrow.  Let me know if your symptoms are worsening or not improving within the next several days.  Take care, Dr Jimmey RalphParker

## 2017-09-11 NOTE — Addendum Note (Signed)
Addended by: Koleen DistanceAGNER, Kerri Asche M on: 09/11/2017 11:07 AM   Modules accepted: Orders

## 2017-09-18 ENCOUNTER — Emergency Department (HOSPITAL_COMMUNITY): Payer: No Typology Code available for payment source

## 2017-09-18 ENCOUNTER — Other Ambulatory Visit: Payer: Self-pay

## 2017-09-18 ENCOUNTER — Encounter: Payer: Self-pay | Admitting: Physician Assistant

## 2017-09-18 ENCOUNTER — Encounter (HOSPITAL_COMMUNITY): Payer: Self-pay | Admitting: Emergency Medicine

## 2017-09-18 ENCOUNTER — Ambulatory Visit: Payer: No Typology Code available for payment source | Admitting: Family Medicine

## 2017-09-18 ENCOUNTER — Ambulatory Visit (INDEPENDENT_AMBULATORY_CARE_PROVIDER_SITE_OTHER): Payer: No Typology Code available for payment source | Admitting: Physician Assistant

## 2017-09-18 ENCOUNTER — Emergency Department (HOSPITAL_COMMUNITY)
Admission: EM | Admit: 2017-09-18 | Discharge: 2017-09-18 | Disposition: A | Discharge status: Home / Self Care | Payer: No Typology Code available for payment source | Attending: Emergency Medicine | Admitting: Emergency Medicine

## 2017-09-18 VITALS — BP 110/80 | HR 91 | Temp 97.9°F | Ht 64.0 in | Wt 335.2 lb

## 2017-09-18 DIAGNOSIS — Z79899 Other long term (current) drug therapy: Secondary | ICD-10-CM | POA: Diagnosis not present

## 2017-09-18 DIAGNOSIS — H60391 Other infective otitis externa, right ear: Secondary | ICD-10-CM | POA: Insufficient documentation

## 2017-09-18 DIAGNOSIS — H66011 Acute suppurative otitis media with spontaneous rupture of ear drum, right ear: Secondary | ICD-10-CM

## 2017-09-18 DIAGNOSIS — H9201 Otalgia, right ear: Secondary | ICD-10-CM | POA: Diagnosis present

## 2017-09-18 LAB — CBC WITH DIFFERENTIAL/PLATELET
Basophils Absolute: 0 10*3/uL (ref 0.0–0.1)
Basophils Relative: 0 %
Eosinophils Absolute: 0.1 10*3/uL (ref 0.0–0.7)
Eosinophils Relative: 1 %
HCT: 39.2 % (ref 36.0–46.0)
Hemoglobin: 12 g/dL (ref 12.0–15.0)
Lymphocytes Relative: 32 %
Lymphs Abs: 2.8 10*3/uL (ref 0.7–4.0)
MCH: 26.5 pg (ref 26.0–34.0)
MCHC: 30.6 g/dL (ref 30.0–36.0)
MCV: 86.5 fL (ref 78.0–100.0)
Monocytes Absolute: 0.5 10*3/uL (ref 0.1–1.0)
Monocytes Relative: 5 %
Neutro Abs: 5.4 10*3/uL (ref 1.7–7.7)
Neutrophils Relative %: 62 %
Platelets: 313 10*3/uL (ref 150–400)
RBC: 4.53 MIL/uL (ref 3.87–5.11)
RDW: 15.3 % (ref 11.5–15.5)
WBC: 8.9 10*3/uL (ref 4.0–10.5)

## 2017-09-18 LAB — I-STAT BETA HCG BLOOD, ED (MC, WL, AP ONLY): I-stat hCG, quantitative: <5 m[IU]/mL (ref <5)

## 2017-09-18 LAB — BASIC METABOLIC PANEL
Anion gap: 7 (ref 5–15)
BUN: 12 mg/dL (ref 6–20)
CO2: 26 mmol/L (ref 22–32)
Calcium: 8.6 mg/dL — ABNORMAL LOW (ref 8.9–10.3)
Chloride: 107 mmol/L (ref 101–111)
Creatinine, Ser: 0.8 mg/dL (ref 0.44–1.00)
GFR calc Af Amer: >60 mL/min (ref >60)
GFR calc non Af Amer: >60 mL/min (ref >60)
Glucose, Bld: 119 mg/dL — ABNORMAL HIGH (ref 65–99)
Potassium: 4 mmol/L (ref 3.5–5.1)
Sodium: 140 mmol/L (ref 135–145)

## 2017-09-18 MED ORDER — AMOXICILLIN-POT CLAVULANATE 875-125 MG PO TABS: 1.0000 | ORAL_TABLET | Freq: Two times a day (BID) | ORAL | 0 refills | Status: DC

## 2017-09-18 MED ORDER — CIPROFLOXACIN HCL 500 MG PO TABS: 500.0000 mg | ORAL_TABLET | Freq: Two times a day (BID) | ORAL | 0 refills | Status: DC

## 2017-09-18 NOTE — ED Provider Notes (Signed)
[facility]-EMERGENCY DEPT Provider Note   CSN: [id number] Arrival date & time: 09/18/17  1011     History   Chief Complaint Chief Complaint  Patient presents with  . Otalgia  . Otitis Media    ruptured ear drum    HPI Emily Frank is a 27 y.o. female with history of obesity, PCOS who presents with a one week history of right ear pain.  She saw her PCP who prescribed amoxicillin.  She has had drainage for the past few days that is white.  She has had persistent symptoms and fevers.  She saw her PCP again today who sent her here to rule out mastoiditis.  She has had decreased hearing in the ear.  She denies any other symptoms.  She denies any preceding URI symptoms.  Patient denies any chest pain, shortness of breath, neck pain, abdominal pain, nausea, vomiting, urinary symptoms.  She has been alternating ibuprofen and Tylenol at home for pain.  She reports in the past she is told that she may need tubes in her ears, however she has not had an ear infection in 5 years.  HPI  Past Medical History:  Diagnosis Date  . Depression   . H/O chlamydia infection   . H/O gonorrhea   . Obesity   . Polycystic ovarian syndrome     Patient Active Problem List   Diagnosis Date Noted  . PCOS (polycystic ovarian syndrome) 04/29/2017  . Morbid obesity (HCC) 04/29/2017  . Amenorrhea 04/29/2017  . H/O gonorrhea   . H/O chlamydia infection     History reviewed. No pertinent surgical history.  OB History    Gravida Para Term Preterm AB Living   1 0 0 0 1 0   SAB TAB Ectopic Multiple Live Births   1 0 0 0 0       Home Medications    Prior to Admission medications   Medication Sig Start Date End Date Taking? Authorizing Provider  ALPRAZolam Prudy Feeler(XANAX) 1 MG tablet Take 1 tablet (1 mg total) by mouth daily as needed for anxiety. Patient taking differently: Take 1 mg by mouth 2 (two) times daily as needed for anxiety.  08/26/17  Yes Helane RimaWallace, Erica, DO  amoxicillin  (AMOXIL) 875 MG tablet Take 1 tablet (875 mg total) by mouth 2 (two) times daily. 09/11/17  Yes Ardith DarkParker, Caleb M, MD  cyanocobalamin (,VITAMIN B-12,) 1000 MCG/ML injection 1000 mcg (1 mg) injection once per week for four weeks, followed by 1000 mcg injection once per month. 07/09/17  Yes Helane RimaWallace, Erica, DO  diclofenac (VOLTAREN) 75 MG EC tablet Take 1 tablet (75 mg total) by mouth 2 (two) times daily. 09/11/17  Yes Ardith DarkParker, Caleb M, MD  Dulaglutide (TRULICITY) 1.5 MG/0.5ML SOPN Inject 1.5 mg into the skin [place] weekly. 08/26/17  Yes Helane RimaWallace, Erica, DO  metFORMIN (GLUCOPHAGE) 1000 MG tablet Take 1 tablet (1,000 mg total) by mouth 2 (two) times daily with a meal. 07/09/17  Yes Helane RimaWallace, Erica, DO  phentermine (ADIPEX-P) 37.5 MG tablet Take 1 tablet (37.5 mg total) by mouth daily before breakfast. 07/29/17  Yes Helane RimaWallace, Erica, DO  promethazine (PHENERGAN) 25 MG tablet Take 1 tablet (25 mg total) by mouth every 8 (eight) hours as needed for nausea or vomiting. 07/31/17  Yes Ardith DarkParker, Caleb M, MD  amoxicillin-clavulanate (AUGMENTIN) 875-125 MG tablet Take 1 tablet by mouth every 12 (twelve) hours. 09/18/17   Denorris Reust, Waylan BogaAlexandra M, PA-C  ciprofloxacin (CIPRO) 500 MG tablet Take 1 tablet (500  mg total) by mouth every 12 (twelve) hours. 09/18/17   Yamir Carignan, Waylan Boga, PA-C  ondansetron (ZOFRAN ODT) 4 MG disintegrating tablet Take 1 tablet (4 mg total) by mouth every 8 (eight) hours as needed for nausea or vomiting. Patient not taking: Reported on 09/18/2017 07/10/17   Helane Rima, DO    Family History Family History  Problem Relation Age of Onset  . Hypertension Mother   . Diabetes Mother   . Hepatitis C Mother   . Renal Disease Mother   . Hypertension Father   . Hepatitis C Father   . Breast cancer Maternal Grandmother   . Brain cancer Maternal Grandmother   . Bone cancer Maternal Grandmother   . Hypertension Maternal Grandmother   . Renal Disease Maternal Grandmother   . Diabetes Maternal Grandmother   . Colon cancer  Maternal Grandfather   . Breast cancer Paternal Grandmother   . Hypertension Paternal Grandmother     Social History Social History   Tobacco Use  . Smoking status: Never Smoker  . Smokeless tobacco: Never Used  Substance Use Topics  . Alcohol use: Yes    Comment: Occasional   . Drug use: No     Allergies   Patient has no known allergies.   Review of Systems Review of Systems  Constitutional: Positive for fever. Negative for chills.  HENT: Positive for ear discharge and ear pain. Negative for facial swelling and sore throat.   Respiratory: Negative for shortness of breath.   Cardiovascular: Negative for chest pain.  Gastrointestinal: Negative for abdominal pain, nausea and vomiting.  Genitourinary: Negative for dysuria.  Musculoskeletal: Negative for back pain and neck pain.  Skin: Negative for rash and wound.  Neurological: Negative for headaches.  Psychiatric/Behavioral: The patient is not nervous/anxious.      Physical Exam Updated Vital Signs BP 117/62 (BP Location: Left Arm)   Pulse 82   Temp 99 F (37.2 C) (Oral)   Resp 18   Wt (!) 151.5 kg (334 lb)   LMP 08/31/2017 (Exact Date)   SpO2 100%   BMI 57.33 kg/m   Physical Exam  Constitutional: She appears well-developed and well-nourished. No distress.  HENT:  Head: Normocephalic and atraumatic.  Right Ear: There is drainage. There is mastoid tenderness. Tympanic membrane is bulging.  Left Ear: Tympanic membrane is injected (mild). Tympanic membrane is not bulging.  Mouth/Throat: Oropharynx is clear and moist. No oropharyngeal exudate.  Narrow R ear canal; dull right TM with effusion, no visible perforation, however drainage has been purulent for the past few days  Eyes: Conjunctivae are normal. Pupils are equal, round, and reactive to light. Right eye exhibits no discharge. Left eye exhibits no discharge. No scleral icterus.  Neck: Normal range of motion. Neck supple. No thyromegaly present.    Cardiovascular: Normal rate, regular rhythm, normal heart sounds and intact distal pulses. Exam reveals no gallop and no friction rub.  No murmur heard. Pulmonary/Chest: Effort normal and breath sounds normal. No stridor. No respiratory distress. She has no wheezes. She has no rales.  Abdominal: Soft. Bowel sounds are normal. She exhibits no distension. There is no tenderness. There is no rebound and no guarding.  Musculoskeletal: She exhibits no edema.  Lymphadenopathy:    She has no cervical adenopathy.  Neurological: She is alert. Coordination normal.  Skin: Skin is warm and dry. No rash noted. She is not diaphoretic. No pallor.  Psychiatric: She has a normal mood and affect.  Nursing note and vitals reviewed.  ED Treatments / Results  Labs (all labs ordered are listed, but only abnormal results are displayed) Labs Reviewed  BASIC METABOLIC PANEL - Abnormal; Notable for the following components:      Result Value   Glucose, Bld 119 (*)    Calcium 8.6 (*)    All other components within normal limits  CBC WITH DIFFERENTIAL/PLATELET  I-STAT BETA HCG BLOOD, ED ([facility], [facility], [facility] ONLY)    EKG  EKG Interpretation None       Radiology Ct Head Wo Contrast  Result Date: 09/18/2017 CLINICAL DATA:  Right-sided ear pain radiating into the jaw. Evaluate for mastoiditis. EXAM: CT HEAD WITHOUT CONTRAST TECHNIQUE: Contiguous axial images were obtained from the base of the skull through the vertex without intravenous contrast. COMPARISON:  CT head dated March 08, 2007. FINDINGS: Brain: No evidence of acute infarction, hemorrhage, hydrocephalus, extra-axial collection or mass lesion/mass effect. Vascular: No hyperdense vessel or unexpected calcification. Skull: Normal. Negative for fracture or focal lesion. Sinuses/Orbits: Partial opacification of the right mastoid air cells. The middle ear is clear. No bony destruction. There is asymmetric soft tissue thickening along the superior margin of the  right external auditory canal (series 4, image 48). The left mastoid air cells are clear. Small retention cyst in a left posterior ethmoid air cell. Remaining paranasal sinuses are clear. No acute orbital abnormality. Other: None. IMPRESSION: 1. Asymmetric soft tissue thickening along the right external auditory canal, suggestive of otitis externa. The middle ear is clear. 2. Partial opacification of the right mastoid air cells without radiographic evidence of mastoiditis. No bony destruction. Electronically Signed   By: Obie Dredge M.D.   On: 09/18/2017 13:08    Procedures Procedures (including critical care time)  Medications Ordered in ED Medications - No data to display   Initial Impression / Assessment and Plan / ED Course  I have reviewed the triage vital signs and the nursing notes.  Pertinent labs & imaging results that were available during my care of the patient were reviewed by me and considered in my medical decision making (see chart for details).     Patient with persistent otitis media and otitis externa.  Considering suspected TM rupture and concern for penetration with otic solution, will treat with Augmentin and ciprofloxacin.  Labs unremarkable.  CT ruled out mastoiditis.  Patient has appointment in 4 days with ENT for follow-up.  Return precautions discussed.  Patient understands and agrees with plan.  Patient vitals stable throughout ED course and discharged in satisfactory condition.  Patient also evaluated by Dr. Madilyn Hook who guided the patient's management and agrees with plan.  Final Clinical Impressions(s) / ED Diagnoses   Final diagnoses:  Acute suppurative otitis media of right ear with spontaneous rupture of tympanic membrane, recurrence not specified  Other infective acute otitis externa of right ear    ED Discharge Orders        Ordered    amoxicillin-clavulanate (AUGMENTIN) 875-125 MG tablet  Every 12 hours     09/18/17 1524    ciprofloxacin (CIPRO) 500  MG tablet  Every 12 hours     09/18/17 1524       Esau Fridman, [place], PA-C 09/18/17 1701    Tilden Fossa, MD 09/23/17 1231

## 2017-09-18 NOTE — Discharge Instructions (Signed)
Take Cipro and please follow-up with ENT Augmentin twice daily for 1 week.  Take a probiotic once daily to help prevent stomach upset from this.  At your scheduled appointment next week.  Please return to emergency department if you develop any new or worsening symptoms.

## 2017-09-18 NOTE — Progress Notes (Signed)
Emily Frank is a 27 y.o. female here for a follow up of a pre-existing problem.  I acted as a Neurosurgeon for [employer], PA-C Corky Mull, LPN  History of Present Illness:   Chief Complaint  Patient presents with  . Right ear pain    Otalgia   There is pain in the right ear. This is a recurrent problem. Episode onset: over one week. The problem occurs every few hours. The problem has been waxing and waning. There has been no fever. The pain is moderate. Associated symptoms include ear discharge and hearing loss. Pertinent negatives include no abdominal pain, coughing, diarrhea, headaches, neck pain, rash, rhinorrhea, sore throat or vomiting. Associated symptoms comments: Pain radiates from right ear down into jaw area. She has tried antibiotics, NSAIDs and acetaminophen for the symptoms. The treatment provided mild relief.   She was seen by Dr. Jimmey Ralph on 09/11/17 -- and started on Amoxicillin and given Diclofenac for pain.  Since that time the symptoms have not improved. Her pain is slightly worse. She tells me that in 2011 she had two ruptured eardrums and was told that "if this happens again, I need tubes." Her pain is not well managed with the Diclofenac and Tylenol. She reports that her hearing is worsening.   Past Medical History:  Diagnosis Date  . Depression   . H/O chlamydia infection   . H/O gonorrhea   . Obesity   . Polycystic ovarian syndrome      Social History   Socioeconomic History  . Marital status: Legally Separated    Spouse name: Not on file  . Number of children: Not on file  . Years of education: Not on file  . Highest education level: Not on file  Social Needs  . Financial resource strain: Not on file  . Food insecurity - worry: Not on file  . Food insecurity - inability: Not on file  . Transportation needs - medical: Not on file  . Transportation needs - non-medical: Not on file  Occupational History    Employer: [employer]  Tobacco Use  . Smoking status: Never Smoker  . Smokeless tobacco: Never Used  Substance and Sexual Activity  . Alcohol use: Yes    Comment: Occasional   . Drug use: No  . Sexual activity: Yes    Partners: Male    Birth control/protection: None  Other Topics Concern  . Not on file  Social History Narrative  . Not on file    History reviewed. No pertinent surgical history.  Family History  Problem Relation Age of Onset  . Hypertension Mother   . Diabetes Mother   . Hepatitis C Mother   . Renal Disease Mother   . Hypertension Father   . Hepatitis C Father   . Breast cancer Maternal Grandmother   . Brain cancer Maternal Grandmother   . Bone cancer Maternal Grandmother   . Hypertension Maternal Grandmother   . Renal Disease Maternal Grandmother   . Diabetes Maternal Grandmother   . Colon cancer Maternal Grandfather   . Breast cancer Paternal Grandmother   . Hypertension Paternal Grandmother     No Known Allergies  Current Medications:   Current Outpatient Medications:  .  ALPRAZolam (XANAX) 1 MG tablet, Take 1 tablet (1 mg total) by mouth daily as needed for anxiety. (Patient taking differently: Take 1 mg by mouth 2 (two) times daily as needed for anxiety. ), Disp: 90 tablet, Rfl: 0 .  amoxicillin (AMOXIL) 875 MG tablet, Take 1 tablet (875 mg total) by mouth 2 (two) times daily., Disp: 20 tablet, Rfl: 0 .  cyanocobalamin (,VITAMIN B-12,) 1000 MCG/ML injection, 1000 mcg (1 mg) injection once per week for four weeks, followed by 1000 mcg injection once per month., Disp: 1 mL, Rfl: 15 .  diclofenac (VOLTAREN) 75 MG EC tablet, Take 1 tablet (75 mg total) by mouth 2 (two) times daily., Disp: 30 tablet, Rfl: 0 .  Dulaglutide (TRULICITY) 1.5 MG/0.5ML SOPN, Inject 1.5 mg into the skin [place] weekly., Disp: 4 pen, Rfl: 2 .  metFORMIN (GLUCOPHAGE) 1000 MG tablet, Take 1 tablet (1,000 mg total) by mouth 2 (two) times daily with a meal., Disp: 180 tablet, Rfl: 3 .  ondansetron  (ZOFRAN ODT) 4 MG disintegrating tablet, Take 1 tablet (4 mg total) by mouth every 8 (eight) hours as needed for nausea or vomiting., Disp: 20 tablet, Rfl: 0 .  phentermine (ADIPEX-P) 37.5 MG tablet, Take 1 tablet (37.5 mg total) by mouth daily before breakfast., Disp: 30 tablet, Rfl: 2 .  promethazine (PHENERGAN) 25 MG tablet, Take 1 tablet (25 mg total) by mouth every 8 (eight) hours as needed for nausea or vomiting., Disp: 20 tablet, Rfl: 0   Review of Systems:   Review of Systems  HENT: Positive for ear discharge, ear pain and hearing loss. Negative for rhinorrhea and sore throat.   Respiratory: Negative for cough.   Gastrointestinal: Negative for abdominal pain, diarrhea and vomiting.  Musculoskeletal: Negative for neck pain.  Skin: Negative for rash.  Neurological: Negative for headaches.    Vitals:   Vitals:   09/18/17 0753  BP: 110/80  Pulse: 91  Temp: 97.9 F (36.6 C)  TempSrc: Oral  SpO2: 96%  Weight: (!) 335 lb 4 oz (152.1 kg)  Height: 5\' 4"  (1.626 m)     Body mass index is 57.55 kg/m.  Physical Exam:   Physical Exam  Constitutional: She appears well-developed. She is cooperative.  Non-toxic appearance. She does not have a sickly appearance. She does not appear ill. No distress.  HENT:  Head: Normocephalic and atraumatic.  Right Ear: Tympanic membrane, external ear and ear canal normal. Tympanic membrane is not erythematous, not retracted and not bulging.  Left Ear: Tympanic membrane, external ear and ear canal normal. Tympanic membrane is not erythematous, not retracted and not bulging.  Nose: Nose normal. Right sinus exhibits no maxillary sinus tenderness and no frontal sinus tenderness. Left sinus exhibits no maxillary sinus tenderness and no frontal sinus tenderness.  Mouth/Throat: Uvula is midline. No posterior oropharyngeal edema or posterior oropharyngeal erythema.  RIGHT EAR: Very difficult to assess R ear due to canal swelling and patient discomfort.  Unable to visualize TM due to purulent material covering TM. Significantly tender in pre-auricular area. Significant pain elicited with movement of auricle. No mastoid tenderness.    Eyes: Conjunctivae and lids are normal.  Neck: Trachea normal.  Cardiovascular: Normal rate, regular rhythm, S1 normal, S2 normal and normal heart sounds.  Pulmonary/Chest: Effort normal and breath sounds normal. She has no decreased breath sounds. She has no wheezes. She has no rhonchi. She has no rales.  Lymphadenopathy:    She has no cervical adenopathy.  Neurological: She is alert.  Skin: Skin is warm, dry and intact.  Psychiatric: She has a normal mood and affect. Her speech is normal and behavior is normal.  Nursing note and vitals reviewed.   Assessment and Plan:    Ella BodoQuianna was seen today  for right ear pain.  Diagnoses and all orders for this visit:  Acute suppurative otitis media of right ear with spontaneous rupture of tympanic membrane, recurrence not specified -     Ambulatory referral to ENT   Reviewed patient with Dr. Jimmey Ralph, who evaluated patient at prior visit. Given lack of improvement and concern for worsening symptoms and need to rule out cholesteatoma or other complications of AOM, will place urgent referral to ENT. Multiple attempts were made to get her in to see ENT urgently, however the earliest appointment that we could get was on Tuesday 09/22/17. Reviewed options with patient and given rapidity of worsening of symptoms and possible need for urgent CT, will send to ER. Patient is agreeable to plan.   . Reviewed expectations re: course of current medical issues. . Discussed self-management of symptoms. . Outlined signs and symptoms indicating need for more acute intervention. . Patient verbalized understanding and all questions were answered. . See orders for this visit as documented in the electronic medical record. . Patient received an After-Visit Summary.  CMA or LPN served  as scribe during this visit. History, Physical, and Plan performed by medical provider. Documentation and orders reviewed and attested to.  Jarold Motto, PA-C

## 2017-09-18 NOTE — Patient Instructions (Addendum)
It was great to meet you.  If you have worsening fever or pain, please go to the ER.  We are going to send you to Dr. Ezzard StandingNewman [address] [place] [id number]

## 2017-09-18 NOTE — ED Triage Notes (Signed)
Pt c/o pain in r/ear x 1 week. Pt is on antibiotic per PCP. Pt seen by PCP again today. Referred to ENT for severe infection in r/ear and possible ruptured ear drum. Unable to get an appointment for 4 days. PCP directed pt to be seen in ED.for CT. Pt is alert, C/o pain in r/ear , radiating to jaw. Reports intermittent dizziness

## 2017-09-22 ENCOUNTER — Other Ambulatory Visit: Payer: Self-pay | Admitting: Family Medicine

## 2017-09-23 ENCOUNTER — Ambulatory Visit: Payer: No Typology Code available for payment source | Admitting: Physician Assistant

## 2017-09-24 ENCOUNTER — Other Ambulatory Visit (HOSPITAL_COMMUNITY): Payer: Self-pay | Admitting: General Surgery

## 2017-10-07 ENCOUNTER — Other Ambulatory Visit: Payer: Self-pay | Admitting: Family Medicine

## 2017-10-07 MED ORDER — ONDANSETRON 4 MG PO TBDP: 4.0000 mg | ORAL_TABLET | Freq: Three times a day (TID) | ORAL | 0 refills | Status: DC | PRN

## 2017-10-07 NOTE — Telephone Encounter (Signed)
Ok to refill 

## 2017-10-19 ENCOUNTER — Ambulatory Visit (INDEPENDENT_AMBULATORY_CARE_PROVIDER_SITE_OTHER): Payer: No Typology Code available for payment source | Admitting: Physician Assistant

## 2017-10-19 ENCOUNTER — Encounter: Payer: Self-pay | Admitting: Physician Assistant

## 2017-10-19 ENCOUNTER — Ambulatory Visit: Payer: No Typology Code available for payment source | Admitting: Psychology

## 2017-10-19 VITALS — BP 130/80 | HR 100 | Temp 98.2°F | Ht 64.0 in | Wt 332.0 lb

## 2017-10-19 DIAGNOSIS — F331 Major depressive disorder, recurrent, moderate: Secondary | ICD-10-CM | POA: Diagnosis not present

## 2017-10-19 DIAGNOSIS — J039 Acute tonsillitis, unspecified: Secondary | ICD-10-CM | POA: Diagnosis not present

## 2017-10-19 DIAGNOSIS — R110 Nausea: Secondary | ICD-10-CM

## 2017-10-19 LAB — POCT URINE PREGNANCY: Preg Test, Ur: NEGATIVE

## 2017-10-19 MED ORDER — AMOXICILLIN-POT CLAVULANATE 875-125 MG PO TABS: 1.0000 | ORAL_TABLET | Freq: Two times a day (BID) | ORAL | 0 refills | Status: DC

## 2017-10-19 NOTE — Progress Notes (Signed)
Emily Frank is a 27 y.o. female here for a new problem.  I acted as a sribe for [employer], PA-C Corky Mull, LPN  History of Present Illness:   Chief Complaint  Patient presents with  . Sinus Problem    Sinus Problem  This is a new problem. The current episode started yesterday. The problem has been gradually worsening since onset. There has been no fever. Her pain is at a severity of 10/10. The pain is severe. Associated symptoms include congestion, coughing, headaches, a hoarse voice, shortness of breath, sinus pressure, sneezing and a sore throat. Pertinent negatives include no chills, ear pain, neck pain or swollen glands. (Nasal drainage thick green) Past treatments include acetaminophen and spray decongestants. The treatment provided no relief.   No LMP recorded. (Menstrual status: Irregular Periods).  No recent travel. Has not taken any Zofran this morning. Denies chest pain. She works at [employer]. Denies diarrhea or abdominal pain.  Past Medical History:  Diagnosis Date  . Depression   . H/O chlamydia infection   . H/O gonorrhea   . Obesity   . Polycystic ovarian syndrome      Social History   Socioeconomic History  . Marital status: Legally Separated    Spouse name: Not on file  . Number of children: Not on file  . Years of education: Not on file  . Highest education level: Not on file  Occupational History    Employer: [employer]  Social Needs  . Financial resource strain: Not on file  . Food insecurity:    Worry: Not on file    Inability: Not on file  . Transportation needs:    Medical: Not on file    Non-medical: Not on file  Tobacco Use  . Smoking status: Never Smoker  . Smokeless tobacco: Never Used  Substance and Sexual Activity  . Alcohol use: Yes    Comment: Occasional   . Drug use: No  . Sexual activity: Not Currently    Partners: Male    Birth control/protection: None  Lifestyle   . Physical activity:    Days per week: Not on file    Minutes per session: Not on file  . Stress: Not on file  Relationships  . Social connections:    Talks on phone: Not on file    Gets together: Not on file    Attends religious service: Not on file    Active member of club or organization: Not on file    Attends meetings of clubs or organizations: Not on file    Relationship status: Not on file  . Intimate partner violence:    Fear of current or ex partner: Not on file    Emotionally abused: Not on file    Physically abused: Not on file    Forced sexual activity: Not on file  Other Topics Concern  . Not on file  Social History Narrative  . Not on file    History reviewed. No pertinent surgical history.  Family History  Problem Relation Age of Onset  . Hypertension Mother   . Diabetes Mother   . Hepatitis C Mother   . Renal Disease Mother   . Hypertension Father   . Hepatitis C Father   . Breast cancer Maternal Grandmother   . Brain cancer Maternal Grandmother   . Bone cancer Maternal Grandmother   . Hypertension Maternal Grandmother   . Renal Disease Maternal Grandmother   .  Diabetes Maternal Grandmother   . Colon cancer Maternal Grandfather   . Breast cancer Paternal Grandmother   . Hypertension Paternal Grandmother     No Known Allergies  Current Medications:   Current Outpatient Medications:  .  ALPRAZolam (XANAX) 1 MG tablet, Take 1 tablet (1 mg total) by mouth daily as needed for anxiety. (Patient taking differently: Take 1 mg by mouth 2 (two) times daily as needed for anxiety. ), Disp: 90 tablet, Rfl: 0 .  cyanocobalamin (,VITAMIN B-12,) 1000 MCG/ML injection, 1000 mcg (1 mg) injection once per week for four weeks, followed by 1000 mcg injection once per month., Disp: 1 mL, Rfl: 15 .  Dulaglutide (TRULICITY) 1.5 MG/0.5ML SOPN, Inject 1.5 mg into the skin [place] weekly., Disp: 4 pen, Rfl: 2 .  fluticasone (FLONASE) 50 MCG/ACT nasal spray, USE 2 SPRAYS IN EACH  NOSTRIL DAILY AT NIGHT, Disp: , Rfl: 0 .  metFORMIN (GLUCOPHAGE) 1000 MG tablet, Take 1 tablet (1,000 mg total) by mouth 2 (two) times daily with a meal., Disp: 180 tablet, Rfl: 3 .  ondansetron (ZOFRAN ODT) 4 MG disintegrating tablet, Take 1 tablet (4 mg total) by mouth every 8 (eight) hours as needed for nausea or vomiting., Disp: 20 tablet, Rfl: 0 .  phentermine (ADIPEX-P) 37.5 MG tablet, Take 1 tablet (37.5 mg total) by mouth daily before breakfast., Disp: 30 tablet, Rfl: 2 .  amoxicillin-clavulanate (AUGMENTIN) 875-125 MG tablet, Take 1 tablet by mouth 2 (two) times daily., Disp: 20 tablet, Rfl: 0   Review of Systems:   Review of Systems  Constitutional: Negative for chills.  HENT: Positive for congestion, hoarse voice, sinus pressure, sneezing and sore throat. Negative for ear pain.   Respiratory: Positive for cough and shortness of breath.   Musculoskeletal: Negative for neck pain.  Neurological: Positive for headaches.    Vitals:   Vitals:   10/19/17 0835  BP: 130/80  Pulse: 100  Temp: 98.2 F (36.8 C)  TempSrc: Oral  Weight: (!) 332 lb (150.6 kg)  Height: 5\' 4"  (1.626 m)     Body mass index is 56.99 kg/m.   Unable to obtain pulse ox due to artificial nails.  Physical Exam:   Physical Exam  Constitutional: She appears well-developed. She is cooperative.  Non-toxic appearance. She does not have a sickly appearance. She does not appear ill. No distress.  Actively vomiting during our discussion  HENT:  Head: Normocephalic and atraumatic.  Right Ear: Tympanic membrane, external ear and ear canal normal. Tympanic membrane is not erythematous, not retracted and not bulging.  Left Ear: Tympanic membrane, external ear and ear canal normal. Tympanic membrane is not erythematous, not retracted and not bulging.  Nose: Nose normal. Right sinus exhibits no maxillary sinus tenderness and no frontal sinus tenderness. Left sinus exhibits no maxillary sinus tenderness and no frontal  sinus tenderness.  Mouth/Throat: Uvula is midline and mucous membranes are normal. Posterior oropharyngeal erythema present. No posterior oropharyngeal edema. Tonsils are 2+ on the right. Tonsils are 2+ on the left. Tonsillar exudate.  Eyes: Conjunctivae and lids are normal.  Neck: Trachea normal.  Cardiovascular: Normal rate, regular rhythm, S1 normal, S2 normal and normal heart sounds.  Pulmonary/Chest: Effort normal and breath sounds normal. She has no decreased breath sounds. She has no wheezes. She has no rhonchi. She has no rales.  Lymphadenopathy:    She has no cervical adenopathy.  Neurological: She is alert.  Skin: Skin is warm, dry and intact.  Psychiatric: She has a normal  mood and affect. Her speech is normal and behavior is normal.  Nursing note and vitals reviewed.  Results for orders placed or performed in visit on 10/19/17  POCT urine pregnancy  Result Value Ref Range   Preg Test, Ur Negative Negative    Assessment and Plan:    Addysin was seen today for sinus problem.  Diagnoses and all orders for this visit:  Nausea Urine pregnancy test is negative. She has not taken zofran yet today, does have this at home. I recommended that she take this to help her push fluids. She is to let us know if this does not help her symptoms. -     POCT urine pregnancy  Exudative tonsillitis Patient with active vomiting in our office today -- did not check rapid strep. Will treat with Augmentin per orders. No red flags on exam.  Discussed taking medications as prescribed. Reviewed return precautions including worsening fever, SOB, worsening cough or other concerns. Push fluids and rest. I recommend that patient follow-up if symptoms worsen or persist despite treatment x 7-10 days, sooner if needed.   Other orders -     amoxicillin-clavulanate (AUGMENTIN) 875-125 MG tablet; Take 1 tablet by mouth 2 (two) times daily.    . Reviewed expectations re: course of current medical  issues. . Discussed self-management of symptoms. . Outlined signs and symptoms indicating need for more acute intervention. . Patient verbalized understanding and all questions were answered. . See orders for this visit as documented in the electronic medical record. . Patient received an After-Visit Summary.  CMA or LPN served as scribe during this visit. History, Physical, and Plan performed by medical provider. Documentation and orders reviewed and attested to.  Jarold Motto, PA-C

## 2017-10-19 NOTE — Patient Instructions (Signed)
It was great to see you!  Use medication as prescribed: Augmentin Push fluids and get plenty of rest. Please return if you are not improving as expected, or if you have high fevers (>101.5) or difficulty swallowing or worsening productive cough.  Call clinic with questions.  I hope you start feeling better soon!  

## 2017-10-23 ENCOUNTER — Ambulatory Visit (INDEPENDENT_AMBULATORY_CARE_PROVIDER_SITE_OTHER): Payer: No Typology Code available for payment source | Admitting: Physician Assistant

## 2017-10-23 ENCOUNTER — Encounter: Payer: Self-pay | Admitting: Physician Assistant

## 2017-10-23 VITALS — BP 130/74 | HR 86 | Temp 98.4°F | Ht 64.0 in | Wt 333.0 lb

## 2017-10-23 DIAGNOSIS — Z713 Dietary counseling and surveillance: Secondary | ICD-10-CM

## 2017-10-23 DIAGNOSIS — F329 Major depressive disorder, single episode, unspecified: Secondary | ICD-10-CM

## 2017-10-23 DIAGNOSIS — F32A Depression, unspecified: Secondary | ICD-10-CM

## 2017-10-23 MED ORDER — ALPRAZOLAM 0.25 MG PO TABS: 0.2500 mg | ORAL_TABLET | Freq: Two times a day (BID) | ORAL | 0 refills | Status: DC | PRN

## 2017-10-23 MED ORDER — ESCITALOPRAM OXALATE 10 MG PO TABS
10.0000 mg | ORAL_TABLET | Freq: Every day | ORAL | 1 refills | Status: DC
Start: 2017-10-23 — End: 2018-02-23

## 2017-10-23 NOTE — Patient Instructions (Addendum)
Stop the sodas OR switch to diet.  Limit sugary coffee drinks to once a week, and it should be a small size.  Soda Facts  What's in a typical 20 oz bottle of soda?  1. Serving Size: A 20 oz soda bottle has 2.5 servings. It's important to look at the Nutrition Facts (right) for the package, not just a single serving, if drinking the entire bottle. A standard serving size is 8 oz.  2. Calories: There are 100 calories in one serving and 250 in the bottle. These calories have no nutritional value.  3. Sugars: It's easier to figure out how much sugar is in a drink by changing the grams to teaspoons. Divide the total sugar grams by four (4) to get an approximate number of teaspoons. This bottle contains about 17 teaspoons of sugar (694).  4. Ingredients: In this beverage, the main ingredient after water is high fructose corn syrup. Other common names for sugars are cane syrup, sucrose, and dextrose.  Sugary Drinks and Your Health  Drinking a 20-ounce (oz) bottle of soda every day can lead to about 25 extra pounds of weight gain a year.   To burn off the additional 250 calories in a 20 oz bottle of soda, you would have to walk briskly for approximately 60 minutes. Choosing healthier drinks is an essential step in maintaining a healthy weight and helping to reverse the obesity epidemic.

## 2017-10-23 NOTE — Progress Notes (Signed)
Emily Frank is a 27 y.o. female here for Nutrition Consult.  I acted as a Neurosurgeon for [employer], PA-C Corky Mull, LPN  History of Present Illness:   Chief Complaint  Patient presents with  . Nutrition Counseling    Pt here today to discuss diet and nutrition. Pt is scheduled for Bariatric surgery in Sept 16th with Dr. Glenna Fellows.  Dietary recall: Wakes up at 7am Coffee --> soymilk and sugar (Caramel Blast) --> 2-3 x a week Breakfast --> skips Lunch at 12p --> goes to hospital cafeteria, has salad; flavored water or coke Dinner 8p --> chicken pot pie; shrimp, salmon, tilapia, asparagus, spinach, flavored water or coke Snacks --> does not snack Beverages  --> 1 x 20 oz bottle of coke daily, [employer] or [employer]  On the weekends, only eats dinner. Since starting Trulicity, Metformin and Phentermine, she has had incredibly decreased appetite. She only eats 1-2 bites of things at a time now. Prior to starting these medications, she was an emotional eater.   Weight: Wt Readings from Last 3 Encounters:  10/23/17 (!) 333 lb (151 kg)  10/19/17 (!) 332 lb (150.6 kg)  09/18/17 (!) 334 lb (151.5 kg)  Highest weight is 355 lb -- December  Exercise: Treadmill and elliptical, 30-60 min; [employer]; does this 4 days week.  Support system: Good support system, girlfriend and coworkers  Sleep: 2-3 hours night (very poor)  Goals: 1- Overall weight goal is to 140-150lb after surgery. 2- Lose 20-35 lb prior to surgery, or be less than 300 lb prior to surgery 3- Start a family one day  Estimated daily energy needs: --> TBD by bariatric dietitian  Patient is also here to discuss depression. She has been taking Xanax BID to help cope recently. She states that her therapist and pharmacist has recommended Lexapro. She is interested in this today. She does not have SI or HI.  No flowsheet data found.    Depression screen PHQ 2/9 07/09/2017  Decreased  Interest 2  Down, Depressed, Hopeless 2  PHQ - 2 Score 4  Tired, decreased energy 3  Change in appetite 3  Feeling bad or failure about yourself  1  Trouble concentrating 0  Moving slowly or fidgety/restless 0  Suicidal thoughts 0  Difficult doing work/chores Somewhat difficult     Past Medical History:  Diagnosis Date  . Depression   . H/O chlamydia infection   . H/O gonorrhea   . Obesity   . Polycystic ovarian syndrome      Social History   Socioeconomic History  . Marital status: Legally Separated    Spouse name: Not on file  . Number of children: Not on file  . Years of education: Not on file  . Highest education level: Not on file  Occupational History    Employer: [employer]  Social Needs  . Financial resource strain: Not on file  . Food insecurity:    Worry: Not on file    Inability: Not on file  . Transportation needs:    Medical: Not on file    Non-medical: Not on file  Tobacco Use  . Smoking status: Never Smoker  . Smokeless tobacco: Never Used  Substance and Sexual Activity  . Alcohol use: Yes    Comment: Occasional   . Drug use: No  . Sexual activity: Not Currently    Partners: Male    Birth control/protection: None  Lifestyle  . Physical activity:  Days per week: Not on file    Minutes per session: Not on file  . Stress: Not on file  Relationships  . Social connections:    Talks on phone: Not on file    Gets together: Not on file    Attends religious service: Not on file    Active member of club or organization: Not on file    Attends meetings of clubs or organizations: Not on file    Relationship status: Not on file  . Intimate partner violence:    Fear of current or ex partner: Not on file    Emotionally abused: Not on file    Physically abused: Not on file    Forced sexual activity: Not on file  Other Topics Concern  . Not on file  Social History Narrative  . Not on file    History reviewed. No  pertinent surgical history.  Family History  Problem Relation Age of Onset  . Hypertension Mother   . Diabetes Mother   . Hepatitis C Mother   . Renal Disease Mother   . Hypertension Father   . Hepatitis C Father   . Breast cancer Maternal Grandmother   . Brain cancer Maternal Grandmother   . Bone cancer Maternal Grandmother   . Hypertension Maternal Grandmother   . Renal Disease Maternal Grandmother   . Diabetes Maternal Grandmother   . Colon cancer Maternal Grandfather   . Breast cancer Paternal Grandmother   . Hypertension Paternal Grandmother     No Known Allergies  Current Medications:   Current Outpatient Medications:  .  amoxicillin-clavulanate (AUGMENTIN) 875-125 MG tablet, Take 1 tablet by mouth 2 (two) times daily., Disp: 20 tablet, Rfl: 0 .  cyanocobalamin (,VITAMIN B-12,) 1000 MCG/ML injection, 1000 mcg (1 mg) injection once per week for four weeks, followed by 1000 mcg injection once per month., Disp: 1 mL, Rfl: 15 .  Dulaglutide (TRULICITY) 1.5 MG/0.5ML SOPN, Inject 1.5 mg into the skin [place] weekly., Disp: 4 pen, Rfl: 2 .  fluticasone (FLONASE) 50 MCG/ACT nasal spray, USE 2 SPRAYS IN EACH NOSTRIL DAILY AT NIGHT, Disp: , Rfl: 0 .  metFORMIN (GLUCOPHAGE) 1000 MG tablet, Take 1 tablet (1,000 mg total) by mouth 2 (two) times daily with a meal., Disp: 180 tablet, Rfl: 3 .  ondansetron (ZOFRAN ODT) 4 MG disintegrating tablet, Take 1 tablet (4 mg total) by mouth every 8 (eight) hours as needed for nausea or vomiting., Disp: 20 tablet, Rfl: 0 .  phentermine (ADIPEX-P) 37.5 MG tablet, Take 1 tablet (37.5 mg total) by mouth daily before breakfast., Disp: 30 tablet, Rfl: 2 .  ALPRAZolam (XANAX) 0.25 MG tablet, Take 1 tablet (0.25 mg total) by mouth 2 (two) times daily as needed for anxiety., Disp: 20 tablet, Rfl: 0 .  escitalopram (LEXAPRO) 10 MG tablet, Take 1 tablet (10 mg total) by mouth daily., Disp: 30 tablet, Rfl: 1   Review of Systems:   ROS  Negative unless otherwise  specified per HPI.   Vitals:   Vitals:   10/23/17 1307  BP: 130/74  Pulse: 86  Temp: 98.4 F (36.9 C)  TempSrc: Oral  SpO2: 99%  Weight: (!) 333 lb (151 kg)  Height: 5\' 4"  (1.626 m)     Body mass index is 57.16 kg/m.  Physical Exam:   Physical Exam  Constitutional: She is oriented to person, place, and time. She appears well-developed and well-nourished.  HENT:  Head: Normocephalic and atraumatic.  Eyes: Conjunctivae and EOM are normal.  Neck: Normal range of motion. Neck supple.  Pulmonary/Chest: Effort normal.  Musculoskeletal: Normal range of motion.  Neurological: She is alert and oriented to person, place, and time.  Skin: Skin is warm and dry.  Psychiatric: She has a normal mood and affect. Her behavior is normal. Judgment and thought content normal.    Assessment and Plan:    Ella BodoQuianna was seen today for nutrition counseling.  Diagnoses and all orders for this visit:  Encounter for nutritional counseling; Morbid obesity (HCC) Congratulated patient on weight loss thus far. Continue medications per PCP. We had a long discussion about sugary beverages, especially sodas and caramel coffee drinks. She states that she is going to try to eliminate these. We talked about adding in some strength training, but she would like to wait until she is smaller, as she is not comfortable going to the gym for that right now. She is meeting with the bariatric dietitian next week.  Depression, unspecified depression type Discussed briefly with patient's PCP. Will start Lexapro 10 mg daily. I did provide a short script refill of 0.25 mg xanax at today's visit. I discussed with her that the Lexapro will take 4-6 weeks to work. Continue therapy. I discussed with patient that if they develop any SI, to tell someone immediately and seek medical attention. Follow-up with PCP in 4-6 weeks, sooner if needed.   Other orders -     escitalopram (LEXAPRO) 10 MG tablet; Take 1 tablet (10 mg  total) by mouth daily. -     ALPRAZolam (XANAX) 0.25 MG tablet; Take 1 tablet (0.25 mg total) by mouth 2 (two) times daily as needed for anxiety.    . Reviewed expectations re: course of current medical issues. . Discussed self-management of symptoms. . Outlined signs and symptoms indicating need for more acute intervention. . Patient verbalized understanding and all questions were answered. . See orders for this visit as documented in the electronic medical record. . Patient received an After-Visit Summary.  CMA or LPN served as scribe during this visit. History, Physical, and Plan performed by medical provider. Documentation and orders reviewed and attested to.  Jarold MottoSamantha Shanieka Blea, PA-C

## 2017-10-26 ENCOUNTER — Ambulatory Visit: Payer: No Typology Code available for payment source | Admitting: Skilled Nursing Facility1

## 2017-10-27 ENCOUNTER — Ambulatory Visit: Payer: Self-pay | Admitting: Psychology

## 2017-10-28 ENCOUNTER — Ambulatory Visit (HOSPITAL_COMMUNITY)
Admission: RE | Admit: 2017-10-28 | Discharge: 2017-10-28 | Disposition: A | Discharge status: Home / Self Care | Payer: No Typology Code available for payment source | Source: Ambulatory Visit | Attending: General Surgery | Admitting: General Surgery

## 2017-10-28 ENCOUNTER — Other Ambulatory Visit: Payer: Self-pay

## 2017-10-28 DIAGNOSIS — Z6841 Body Mass Index (BMI) 40.0 and over, adult: Secondary | ICD-10-CM | POA: Diagnosis not present

## 2017-10-28 DIAGNOSIS — Z01818 Encounter for other preprocedural examination: Secondary | ICD-10-CM | POA: Diagnosis not present

## 2017-10-29 ENCOUNTER — Ambulatory Visit: Payer: No Typology Code available for payment source | Admitting: Skilled Nursing Facility1

## 2017-11-02 ENCOUNTER — Ambulatory Visit: Payer: No Typology Code available for payment source | Admitting: Psychology

## 2017-11-09 ENCOUNTER — Ambulatory Visit: Payer: No Typology Code available for payment source | Admitting: Psychology

## 2017-11-10 ENCOUNTER — Ambulatory Visit: Payer: Self-pay | Admitting: Skilled Nursing Facility1

## 2017-11-17 ENCOUNTER — Other Ambulatory Visit: Payer: Self-pay | Admitting: Physician Assistant

## 2017-11-17 MED ORDER — ALPRAZOLAM 0.25 MG PO TABS: 0.2500 mg | ORAL_TABLET | Freq: Two times a day (BID) | ORAL | 0 refills | Status: DC | PRN

## 2017-11-17 NOTE — Telephone Encounter (Signed)
Please advise on refill.

## 2017-11-23 ENCOUNTER — Ambulatory Visit: Payer: No Typology Code available for payment source | Admitting: Family Medicine

## 2017-11-23 DIAGNOSIS — Z0289 Encounter for other administrative examinations: Secondary | ICD-10-CM

## 2017-11-23 NOTE — Progress Notes (Deleted)
Emily Frank is a 27 y.o. female is here for follow up.  History of Present Illness:   HPI: There are no preventive care reminders to display for this patient. Depression screen PHQ 2/9 07/09/2017  Decreased Interest 2  Down, Depressed, Hopeless 2  PHQ - 2 Score 4  Tired, decreased energy 3  Change in appetite 3  Feeling bad or failure about yourself  1  Trouble concentrating 0  Moving slowly or fidgety/restless 0  Suicidal thoughts 0  Difficult doing work/chores Somewhat difficult   PMHx, SurgHx, SocialHx, FamHx, Medications, and Allergies were reviewed in the Visit Navigator and updated as appropriate.   Patient Active Problem List   Diagnosis Date Noted  . Depression 10/23/2017  . PCOS (polycystic ovarian syndrome) 04/29/2017  . Morbid obesity (HCC) 04/29/2017  . Amenorrhea 04/29/2017  . H/O gonorrhea   . H/O chlamydia infection    Social History   Tobacco Use  . Smoking status: Never Smoker  . Smokeless tobacco: Never Used  Substance Use Topics  . Alcohol use: Yes    Comment: Occasional   . Drug use: No   Current Medications and Allergies:   Current Outpatient Medications:  .  ALPRAZolam (XANAX) 0.25 MG tablet, Take 1 tablet (0.25 mg total) by mouth 2 (two) times daily as needed for anxiety., Disp: 20 tablet, Rfl: 0 .  amoxicillin-clavulanate (AUGMENTIN) 875-125 MG tablet, Take 1 tablet by mouth 2 (two) times daily., Disp: 20 tablet, Rfl: 0 .  cyanocobalamin (,VITAMIN B-12,) 1000 MCG/ML injection, 1000 mcg (1 mg) injection once per week for four weeks, followed by 1000 mcg injection once per month., Disp: 1 mL, Rfl: 15 .  Dulaglutide (TRULICITY) 1.5 MG/0.5ML SOPN, Inject 1.5 mg into the skin [place] weekly., Disp: 4 pen, Rfl: 2 .  escitalopram (LEXAPRO) 10 MG tablet, Take 1 tablet (10 mg total) by mouth daily., Disp: 30 tablet, Rfl: 1 .  fluticasone (FLONASE) 50 MCG/ACT nasal spray, USE 2 SPRAYS IN EACH NOSTRIL DAILY AT NIGHT, Disp: , Rfl: 0 .  metFORMIN  (GLUCOPHAGE) 1000 MG tablet, Take 1 tablet (1,000 mg total) by mouth 2 (two) times daily with a meal., Disp: 180 tablet, Rfl: 3 .  ondansetron (ZOFRAN ODT) 4 MG disintegrating tablet, Take 1 tablet (4 mg total) by mouth every 8 (eight) hours as needed for nausea or vomiting., Disp: 20 tablet, Rfl: 0 .  phentermine (ADIPEX-P) 37.5 MG tablet, Take 1 tablet (37.5 mg total) by mouth daily before breakfast., Disp: 30 tablet, Rfl: 2  No Known Allergies Review of Systems   Pertinent items are noted in the HPI. Otherwise, ROS is negative.  Vitals:  There were no vitals filed for this visit.   There is no height or weight on file to calculate BMI.  Physical Exam:   Physical Exam Results for orders placed or performed in visit on 10/19/17  POCT urine pregnancy  Result Value Ref Range   Preg Test, Ur Negative Negative    Assessment and Plan:   There are no diagnoses linked to this encounter.  . Reviewed expectations re: course of current medical issues. . Discussed self-management of symptoms. . Outlined signs and symptoms indicating need for more acute intervention. . Patient verbalized understanding and all questions were answered. Marland Kitchen Health Maintenance issues including appropriate healthy diet, exercise, and smoking avoidance were discussed with patient. . See orders for this visit as documented in the electronic medical record. . Patient received an After Visit Summary.  Helane Rima, DO  [place] 11/23/2017  Future Appointments  Date Time Provider Department Center  11/23/2017  7:00 AM Helane Rima, DO [facility]  02/11/2018  2:00 PM Vidal Schwalbe, PsyD [facility] None  02/18/2018  3:00 PM Vidal Schwalbe, PsyD [facility] None    *** CMA served as scribe during this visit. History, Physical, and Plan performed by medical provider. The above documentation has been reviewed and is accurate and complete. Helane Rima, D.O.

## 2017-11-25 ENCOUNTER — Encounter: Payer: Self-pay | Admitting: Family Medicine

## 2017-12-11 ENCOUNTER — Encounter: Payer: Self-pay | Admitting: Family Medicine

## 2017-12-11 ENCOUNTER — Ambulatory Visit (INDEPENDENT_AMBULATORY_CARE_PROVIDER_SITE_OTHER): Payer: No Typology Code available for payment source | Admitting: Family Medicine

## 2017-12-11 VITALS — BP 126/74 | HR 82 | Temp 98.6°F | Ht 64.0 in | Wt 331.0 lb

## 2017-12-11 DIAGNOSIS — Z0000 Encounter for general adult medical examination without abnormal findings: Secondary | ICD-10-CM

## 2017-12-11 DIAGNOSIS — E538 Deficiency of other specified B group vitamins: Secondary | ICD-10-CM

## 2017-12-11 DIAGNOSIS — E8881 Metabolic syndrome: Secondary | ICD-10-CM | POA: Diagnosis not present

## 2017-12-11 LAB — POCT GLYCOSYLATED HEMOGLOBIN (HGB A1C): Hemoglobin A1C: 5.1 % (ref 4.0–5.6)

## 2017-12-11 MED ORDER — PHENTERMINE HCL 37.5 MG PO TABS: 37.5000 mg | ORAL_TABLET | Freq: Every day | ORAL | 2 refills | Status: DC

## 2017-12-11 MED ORDER — DULAGLUTIDE 1.5 MG/0.5ML ~~LOC~~ SOAJ: SUBCUTANEOUS | 2 refills | Status: DC

## 2017-12-11 MED ORDER — CYANOCOBALAMIN 1000 MCG/ML IJ SOLN: INTRAMUSCULAR | 2 refills | Status: DC

## 2017-12-11 MED ORDER — CYANOCOBALAMIN 1000 MCG/ML IJ SOLN
1000.0000 ug | Freq: Once | INTRAMUSCULAR | Status: AC
  Administered 2017-12-11: 1000 ug via INTRAMUSCULAR

## 2017-12-11 MED ORDER — ONDANSETRON 4 MG PO TBDP: 4.0000 mg | ORAL_TABLET | Freq: Three times a day (TID) | ORAL | 0 refills | Status: DC | PRN

## 2017-12-11 MED ORDER — "SYRINGE 25G X 1"" 3 ML MISC": 1.0000 "application " | 0 refills | Status: DC

## 2017-12-11 NOTE — Progress Notes (Signed)
Subjective:    Emily Frank is a 27 y.o. female and is here for a comprehensive physical exam. She has no concerns today. Working toward bariatric surgery. No issues with current medications or plans.  OB History    Gravida  1   Para  0   Term  0   Preterm  0   AB  1   Living  0     SAB  1   TAB  0   Ectopic  0   Multiple  0   Live Births  0           There are no preventive care reminders to display for this patient.  PMHx, SurgHx, SocialHx, Medications, and Allergies were reviewed in the Visit Navigator and updated as appropriate.   Past Medical History:  Diagnosis Date  . Depression   . H/O chlamydia infection   . H/O gonorrhea   . Obesity   . Polycystic ovarian syndrome    No past surgical history on file.  Family History  Problem Relation Age of Onset  . Hypertension Mother   . Diabetes Mother   . Hepatitis C Mother   . Renal Disease Mother   . Hypertension Father   . Hepatitis C Father   . Breast cancer Maternal Grandmother   . Brain cancer Maternal Grandmother   . Bone cancer Maternal Grandmother   . Hypertension Maternal Grandmother   . Renal Disease Maternal Grandmother   . Diabetes Maternal Grandmother   . Colon cancer Maternal Grandfather   . Breast cancer Paternal Grandmother   . Hypertension Paternal Grandmother    Social History   Tobacco Use  . Smoking status: Never Smoker  . Smokeless tobacco: Never Used  Substance Use Topics  . Alcohol use: Yes    Comment: Occasional   . Drug use: No    Review of Systems:   Pertinent items are noted in the HPI. Otherwise, ROS is negative.  Objective:   BP 126/74   Pulse 82   Temp 98.6 F (37 C) (Oral)   Ht  (1.626 m)   Wt (!) 331 lb (150.1 kg)   LMP 10/12/2017   SpO2 98%   BMI 56.82 kg/m   Wt Readings from Last 3 Encounters:  12/11/17 (!) 331 lb (150.1 kg)  10/23/17 (!) 333 lb (151 kg)  10/19/17 (!) 332 lb (150.6 kg)     Ht Readings from Last 3 Encounters:    12/11/17  (1.626 m)  10/23/17  (1.626 m)  10/19/17  (1.626 m)    General appearance: alert, cooperative and appears stated age. Head: normocephalic, without obvious abnormality, atraumatic. Neck: no adenopathy, supple, symmetrical, trachea midline; thyroid not enlarged, symmetric, no tenderness/mass/nodules. Lungs: clear to auscultation bilaterally. Heart: regular rate and rhythm Abdomen: soft, non-tender; no masses,  no organomegaly. Extremities: extremities normal, atraumatic, no cyanosis or edema. Skin: skin color, texture, turgor normal, no rashes or lesions. Lymph: cervical, supraclavicular, and axillary nodes normal; no abnormal inguinal nodes palpated. Neurologic: grossly normal.  Assessment/Plan:   Emily Frank was seen today for annual exam.  Diagnoses and all orders for this visit:  Routine physical examination  Morbid obesity (HCC) -     phentermine (ADIPEX-P) 37.5 MG tablet; Take 1 tablet (37.5 mg total) by mouth daily before breakfast. -     Dulaglutide (TRULICITY) 1.5 MG/0.5ML SOPN; Inject 1.5 mg into the skin [place] weekly.  Insulin resistance -     POCT  glycosylated hemoglobin (Hb A1C)  B12 deficiency -     cyanocobalamin ((VITAMIN B-12)) injection 1,000 mcg -     cyanocobalamin (,VITAMIN B-12,) 1000 MCG/ML injection; 1000 mcg (1 mg) injection once per week for three months -     Syringe/Needle, Disp, (SYRINGE 3CC/25GX1") 25G X 1" 3 ML MISC; 1 application by Does not apply route once a week. DX d52.9  Other orders -     ondansetron (ZOFRAN ODT) 4 MG disintegrating tablet; Take 1 tablet (4 mg total) by mouth every 8 (eight) hours as needed for nausea or vomiting.    Patient Counseling:       Nutrition: Stressed importance of moderation in sodium/caffeine intake, saturated fat and cholesterol, caloric balance, sufficient intake of fresh fruits, vegetables, fiber, calcium, iron, and 1 mg of folate supplement per day (for females capable of  pregnancy).        Stressed the importance of regular exercise.        Substance Abuse: Discussed cessation/primary prevention of tobacco, alcohol, or other drug use; driving or other dangerous activities under the influence; availability of treatment for abuse.         Injury prevention: Discussed safety belts, safety helmets, smoke detector, smoking near bedding or upholstery.         Sexuality: Discussed sexually transmitted diseases, partner selection, use of condoms, avoidance of unintended pregnancy  and contraceptive alternatives.        Dental health: Discussed importance of regular tooth brushing, flossing, and dental visits.        Health maintenance and immunizations reviewed. Please refer to Health maintenance section.   Helane Rima, DO [place] [facility]

## 2017-12-13 ENCOUNTER — Encounter: Payer: Self-pay | Admitting: Family Medicine

## 2017-12-13 DIAGNOSIS — E538 Deficiency of other specified B group vitamins: Secondary | ICD-10-CM | POA: Insufficient documentation

## 2017-12-13 DIAGNOSIS — E88819 Insulin resistance, unspecified: Secondary | ICD-10-CM | POA: Insufficient documentation

## 2017-12-13 DIAGNOSIS — E8881 Metabolic syndrome: Secondary | ICD-10-CM | POA: Insufficient documentation

## 2017-12-14 ENCOUNTER — Encounter: Payer: Self-pay | Admitting: Family Medicine

## 2017-12-15 ENCOUNTER — Ambulatory Visit: Payer: No Typology Code available for payment source | Attending: Family Medicine

## 2017-12-15 ENCOUNTER — Other Ambulatory Visit: Payer: Self-pay | Admitting: Surgical

## 2017-12-15 DIAGNOSIS — Z114 Encounter for screening for human immunodeficiency virus [HIV]: Secondary | ICD-10-CM | POA: Insufficient documentation

## 2017-12-15 NOTE — Progress Notes (Signed)
Patient here for lab visit  

## 2017-12-16 LAB — HIV ANTIBODY (ROUTINE TESTING W REFLEX): HIV Screen 4th Generation wRfx: NONREACTIVE

## 2017-12-17 ENCOUNTER — Ambulatory Visit: Payer: No Typology Code available for payment source | Attending: Family Medicine

## 2017-12-17 NOTE — Progress Notes (Signed)
Patient here for lab visit only 

## 2017-12-28 ENCOUNTER — Ambulatory Visit (INDEPENDENT_AMBULATORY_CARE_PROVIDER_SITE_OTHER): Payer: Self-pay | Admitting: Family Medicine

## 2017-12-28 ENCOUNTER — Encounter: Payer: Self-pay | Admitting: Family Medicine

## 2017-12-28 VITALS — BP 100/70 | HR 70 | Temp 97.8°F | Wt 329.8 lb

## 2017-12-28 DIAGNOSIS — R519 Headache, unspecified: Secondary | ICD-10-CM

## 2017-12-28 DIAGNOSIS — R51 Headache: Principal | ICD-10-CM

## 2017-12-28 MED ORDER — PROMETHAZINE HCL 25 MG PO TABS: 25.0000 mg | ORAL_TABLET | Freq: Three times a day (TID) | ORAL | 0 refills | Status: DC | PRN

## 2017-12-28 MED ORDER — IBUPROFEN 800 MG PO TABS: 800.0000 mg | ORAL_TABLET | Freq: Three times a day (TID) | ORAL | 0 refills | Status: DC | PRN

## 2017-12-28 NOTE — Progress Notes (Signed)
Emily Frank is a 27 y.o. female who presents today with concerns of headache that began this morning. She reports that she has taken a dose of tylenol and motrin throughout the day without relief. She does report a history of migraines that occurred as a side effect of implanon use that persisted for the 1 year that she used the birthcontrol until it was removed due to the side effect of migraines. She has not since this time over 2 years ago experienced migraines.   Review of Systems  Constitutional: Negative for chills, fever and malaise/fatigue.  HENT: Negative for congestion, ear discharge, ear pain, sinus pain and sore throat.   Eyes: Negative.   Respiratory: Negative for cough, sputum production and shortness of breath.   Cardiovascular: Negative.  Negative for chest pain.  Gastrointestinal: Negative for abdominal pain, diarrhea, nausea and vomiting.  Genitourinary: Negative for dysuria, frequency, hematuria and urgency.  Musculoskeletal: Negative for myalgias.  Skin: Negative.   Neurological: Negative for headaches.  Endo/Heme/Allergies: Negative.   Psychiatric/Behavioral: Negative.     O: Vitals:   12/28/17 1657  BP: 100/70  Pulse: 70  Temp: 97.8 F (36.6 C)  SpO2: 98%     Physical Exam  Constitutional: She is oriented to person, place, and time. Vital signs are normal. She appears well-developed and well-nourished. She is active.  Non-toxic appearance. She does not have a sickly appearance.  HENT:  Head: Normocephalic.  Right Ear: Hearing, tympanic membrane, external ear and ear canal normal.  Left Ear: Hearing, tympanic membrane, external ear and ear canal normal.  Nose: Nose normal.  Mouth/Throat: Uvula is midline and oropharynx is clear and moist.  Neck: Normal range of motion. Neck supple.  Cardiovascular: Normal rate, regular rhythm, normal heart sounds and normal pulses.  Pulmonary/Chest: Effort normal and breath sounds normal.  Abdominal: Soft. Bowel sounds  are normal.  Musculoskeletal: Normal range of motion.  Lymphadenopathy:       Head (right side): No submental and no submandibular adenopathy present.       Head (left side): No submental and no submandibular adenopathy present.    She has no cervical adenopathy.  Neurological: She is alert and oriented to person, place, and time. No cranial nerve deficit.  Psychiatric: She has a normal mood and affect.  Vitals reviewed.  A: 1. Intractable headache, unspecified chronicity pattern, unspecified headache type    P: In the presence of a normal exam to include eye and neuro- will treat with rest, hydration, NSAID, and phenergan. Patient reports history of a toradol injection providing relief in the past. Explained how motrin and toradol are both NSAID's and the hope that a work note and the combination of therapy prescribed today will decrease symptoms. Patient agreed with care during the visit.  Exam findings, diagnosis etiology and medication use and indications reviewed with patient. Follow- Up and discharge instructions provided. No emergent/urgent issues found on exam.  Patient verbalized understanding of information provided and agrees with plan of care (POC), all questions answered.  1. Intractable headache, unspecified chronicity pattern, unspecified headache type - ibuprofen (ADVIL,MOTRIN) 800 MG tablet; Take 1 tablet (800 mg total) by mouth every 8 (eight) hours as needed for headache. - promethazine (PHENERGAN) 25 MG tablet; Take 1 tablet (25 mg total) by mouth every 8 (eight) hours as needed for nausea (headache-may causes drowsiness).

## 2017-12-28 NOTE — Patient Instructions (Signed)

## 2017-12-30 ENCOUNTER — Encounter: Payer: Self-pay | Admitting: Physician Assistant

## 2017-12-30 ENCOUNTER — Ambulatory Visit (INDEPENDENT_AMBULATORY_CARE_PROVIDER_SITE_OTHER): Payer: No Typology Code available for payment source | Admitting: Physician Assistant

## 2017-12-30 VITALS — BP 128/90 | HR 92 | Temp 98.5°F | Ht 64.0 in | Wt 328.2 lb

## 2017-12-30 DIAGNOSIS — R519 Headache, unspecified: Secondary | ICD-10-CM

## 2017-12-30 DIAGNOSIS — R51 Headache: Secondary | ICD-10-CM | POA: Diagnosis not present

## 2017-12-30 LAB — COMPREHENSIVE METABOLIC PANEL
ALBUMIN: 4 g/dL (ref 3.5–5.2)
ALK PHOS: 71 U/L (ref 39–117)
ALT: 9 U/L (ref 0–35)
AST: 11 U/L (ref 0–37)
BILIRUBIN TOTAL: 0.3 mg/dL (ref 0.2–1.2)
BUN: 10 mg/dL (ref 6–23)
CALCIUM: 9.4 mg/dL (ref 8.4–10.5)
CO2: 30 meq/L (ref 19–32)
Chloride: 103 mEq/L (ref 96–112)
Creatinine, Ser: 0.67 mg/dL (ref 0.40–1.20)
GFR: 134.99 mL/min (ref >60.00)
Glucose, Bld: 79 mg/dL (ref 70–99)
Potassium: 4.3 mEq/L (ref 3.5–5.1)
Sodium: 138 mEq/L (ref 135–145)
Total Protein: 8.2 g/dL (ref 6.0–8.3)

## 2017-12-30 LAB — CBC
HCT: 39.1 % (ref 36.0–46.0)
HEMOGLOBIN: 12.7 g/dL (ref 12.0–15.0)
MCHC: 32.6 g/dL (ref 30.0–36.0)
MCV: 83.6 fl (ref 78.0–100.0)
PLATELETS: 303 10*3/uL (ref 150.0–400.0)
RBC: 4.67 Mil/uL (ref 3.87–5.11)
RDW: 15.5 % (ref 11.5–15.5)
WBC: 8 10*3/uL (ref 4.0–10.5)

## 2017-12-30 MED ORDER — KETOROLAC TROMETHAMINE 60 MG/2ML IM SOLN
60.0000 mg | Freq: Once | INTRAMUSCULAR | Status: AC
  Administered 2017-12-30: 60 mg via INTRAMUSCULAR

## 2017-12-30 MED ORDER — KETOROLAC TROMETHAMINE 10 MG PO TABS: 10.0000 mg | ORAL_TABLET | Freq: Four times a day (QID) | ORAL | 0 refills | Status: DC | PRN

## 2017-12-30 NOTE — Progress Notes (Signed)
Emily Frank is a 27 y.o. female here for a new problem.  History of Present Illness:   Chief Complaint  Patient presents with  . Headache    Headache   This is a new problem. Episode onset: Started on Monday, saw [facility] on Monday  The problem occurs constantly. The problem has been gradually worsening. The pain is located in the temporal region. The pain radiates to the face. The quality of the pain is described as pulsating and throbbing. The pain is at a severity of 10/10. The pain is severe. Associated symptoms include eye pain (Right eye), eye watering, a fever (100 at 9:00 AM), a loss of balance, neck pain, photophobia, scalp tenderness and sinus pressure. Pertinent negatives include no dizziness, sore throat or tinnitus. The symptoms are aggravated by bright light, activity and noise. She has tried darkened room (Ibuprofen 800 mg every 8 hours, Phenergan for nausea) for the symptoms. The treatment provided mild relief. Her past medical history is significant for obesity.   She has had Toradol in the past and tolerated well. She is agreeable to this today. Denies CP, SOB. Appetite is fair. Has not held her phentermine.  BP Readings from Last 3 Encounters:  12/30/17 128/90  12/28/17 100/70  12/11/17 126/74      Past Medical History:  Diagnosis Date  . Depression   . H/O chlamydia infection   . H/O gonorrhea   . Obesity   . Polycystic ovarian syndrome      Social History   Socioeconomic History  . Marital status: Legally Separated    Spouse name: Not on file  . Number of children: Not on file  . Years of education: Not on file  . Highest education level: Not on file  Occupational History    Employer: [employer]  Social Needs  . Financial resource strain: Not on file  . Food insecurity:    Worry: Not on file    Inability: Not on file  . Transportation needs:    Medical: Not on file    Non-medical: Not on file  Tobacco Use  .  Smoking status: Never Smoker  . Smokeless tobacco: Never Used  Substance and Sexual Activity  . Alcohol use: Yes    Comment: Occasional   . Drug use: No  . Sexual activity: Not Currently    Partners: Male    Birth control/protection: None  Lifestyle  . Physical activity:    Days per week: Not on file    Minutes per session: Not on file  . Stress: Not on file  Relationships  . Social connections:    Talks on phone: Not on file    Gets together: Not on file    Attends religious service: Not on file    Active member of club or organization: Not on file    Attends meetings of clubs or organizations: Not on file    Relationship status: Not on file  . Intimate partner violence:    Fear of current or ex partner: Not on file    Emotionally abused: Not on file    Physically abused: Not on file    Forced sexual activity: Not on file  Other Topics Concern  . Not on file  Social History Narrative  . Not on file    History reviewed. No pertinent surgical history.  Family History  Problem Relation Age of Onset  . Hypertension Mother   . Diabetes Mother   .  Hepatitis C Mother   . Renal Disease Mother   . Hypertension Father   . Hepatitis C Father   . Breast cancer Maternal Grandmother   . Brain cancer Maternal Grandmother   . Bone cancer Maternal Grandmother   . Hypertension Maternal Grandmother   . Renal Disease Maternal Grandmother   . Diabetes Maternal Grandmother   . Colon cancer Maternal Grandfather   . Breast cancer Paternal Grandmother   . Hypertension Paternal Grandmother     No Known Allergies  Current Medications:   Current Outpatient Medications:  .  ALPRAZolam (XANAX) 0.25 MG tablet, Take 1 tablet (0.25 mg total) by mouth 2 (two) times daily as needed for anxiety., Disp: 20 tablet, Rfl: 0 .  cyanocobalamin (,VITAMIN B-12,) 1000 MCG/ML injection, 1000 mcg (1 mg) injection once per week for four weeks, followed by 1000 mcg injection once per month., Disp: 1 mL,  Rfl: 15 .  cyanocobalamin (,VITAMIN B-12,) 1000 MCG/ML injection, 1000 mcg (1 mg) injection once per week for three months, Disp: 4 mL, Rfl: 2 .  Dulaglutide (TRULICITY) 1.5 MG/0.5ML SOPN, Inject 1.5 mg into the skin [place] weekly., Disp: 4 pen, Rfl: 2 .  escitalopram (LEXAPRO) 10 MG tablet, Take 1 tablet (10 mg total) by mouth daily., Disp: 30 tablet, Rfl: 1 .  fluticasone (FLONASE) 50 MCG/ACT nasal spray, USE 2 SPRAYS IN EACH NOSTRIL DAILY AT NIGHT, Disp: , Rfl: 0 .  ibuprofen (ADVIL,MOTRIN) 800 MG tablet, Take 1 tablet (800 mg total) by mouth every 8 (eight) hours as needed for headache., Disp: 20 tablet, Rfl: 0 .  metFORMIN (GLUCOPHAGE) 1000 MG tablet, Take 1 tablet (1,000 mg total) by mouth 2 (two) times daily with a meal., Disp: 180 tablet, Rfl: 3 .  phentermine (ADIPEX-P) 37.5 MG tablet, Take 1 tablet (37.5 mg total) by mouth daily before breakfast., Disp: 30 tablet, Rfl: 2 .  promethazine (PHENERGAN) 25 MG tablet, Take 1 tablet (25 mg total) by mouth every 8 (eight) hours as needed for nausea (headache-may causes drowsiness)., Disp: 20 tablet, Rfl: 0 .  Syringe/Needle, Disp, (SYRINGE 3CC/25GX1") 25G X 1" 3 ML MISC, 1 application by Does not apply route once a week. DX d52.9, Disp: 12 each, Rfl: 0 .  ketorolac (TORADOL) 10 MG tablet, Take 1 tablet (10 mg total) by mouth every 6 (six) hours as needed., Disp: 20 tablet, Rfl: 0 .  ondansetron (ZOFRAN ODT) 4 MG disintegrating tablet, Take 1 tablet (4 mg total) by mouth every 8 (eight) hours as needed for nausea or vomiting. (Patient not taking: Reported on 12/30/2017), Disp: 20 tablet, Rfl: 0   Review of Systems:   Review of Systems  Constitutional: Positive for fever (100 at 9:00 AM).  HENT: Positive for sinus pressure. Negative for sore throat and tinnitus.   Eyes: Positive for photophobia and pain (Right eye).  Musculoskeletal: Positive for neck pain.  Neurological: Positive for headaches and loss of balance. Negative for dizziness.     Vitals:   Vitals:   12/30/17 1311  BP: 128/90  Pulse: 92  Temp: 98.5 F (36.9 C)  TempSrc: Oral  Weight: (!) 328 lb 4 oz (148.9 kg)  Height: 5\' 4"  (1.626 m)     Body mass index is 56.34 kg/m.  Physical Exam:   Physical Exam  Constitutional: She appears well-developed. She is cooperative.  Non-toxic appearance. She does not have a sickly appearance. She does not appear ill. No distress.  HENT:  Head: Normocephalic and atraumatic.  Right Ear: Tympanic membrane,  external ear and ear canal normal. Tympanic membrane is not erythematous, not retracted and not bulging.  Left Ear: Tympanic membrane, external ear and ear canal normal. Tympanic membrane is not erythematous, not retracted and not bulging.  Nose: Nose normal. Right sinus exhibits no maxillary sinus tenderness and no frontal sinus tenderness. Left sinus exhibits no maxillary sinus tenderness and no frontal sinus tenderness.  Mouth/Throat: Uvula is midline. No posterior oropharyngeal edema or posterior oropharyngeal erythema.  Eyes: Conjunctivae and lids are normal.  Neck: Trachea normal.  Cardiovascular: Normal rate, regular rhythm, S1 normal, S2 normal and normal heart sounds.  Pulmonary/Chest: Effort normal and breath sounds normal. She has no decreased breath sounds. She has no wheezes. She has no rhonchi. She has no rales.  Lymphadenopathy:    She has no cervical adenopathy.  Neurological: She is alert. She has normal strength. No cranial nerve deficit or sensory deficit. GCS eye subscore is 4. GCS verbal subscore is 5. GCS motor subscore is 6.  Skin: Skin is warm, dry and intact.  Psychiatric: She has a normal mood and affect. Her speech is normal and behavior is normal.  Nursing note and vitals reviewed.   Assessment and Plan:    Destanie was seen today for headache.  Diagnoses and all orders for this visit:  Intractable headache, unspecified chronicity pattern, unspecified headache type -     CBC -      Comprehensive metabolic panel -     ketorolac (TORADOL) injection 60 mg  Other orders -     ketorolac (TORADOL) 10 MG tablet; Take 1 tablet (10 mg total) by mouth every 6 (six) hours as needed.   No red flags on exam, neuro exam benign. Received toradol injection and tolerated well. Given persistent and worsening symptoms, will check CBC and CMP. Provided list of red flags. Encouraged hydration. Hold phentermine. Provided prescription of oral toradol for her to take as soon as tomorrow if needed. Patient verbalized understanding to plan. Follow-up with [place] if sx worsen/persist.   . Reviewed expectations re: course of current medical issues. . Discussed self-management of symptoms. . Outlined signs and symptoms indicating need for more acute intervention. . Patient verbalized understanding and all questions were answered. . See orders for this visit as documented in the electronic medical record. . Patient received an After-Visit Summary.  Jarold Motto, PA-C

## 2017-12-30 NOTE — Patient Instructions (Signed)
It was great to see you.  Hold phentermine while you are having headaches.  You may take oral toradol starting tomorrow if needed.  Contact a health care provider if:  You develop symptoms that are different or more severe than your usual migraine symptoms. Get help right away if:  Your migraine becomes severe.  You have a fever.  You have a stiff neck.  You have vision loss.  Your muscles feel weak or like you cannot control them.  You start to lose your balance often.  You develop trouble walking.  You faint. This information is not intended to replace advice given to you by your health care provider. Make sure you discuss any questions you have with your health care provider.

## 2018-01-14 NOTE — Progress Notes (Deleted)
Emily Frank is a 27 y.o. female is here for follow up.  History of Present Illness:   HPI: There are no preventive care reminders to display for this patient. Depression screen [facility] 2/9 12/11/2017 07/09/2017  Decreased Interest 1 2  Down, Depressed, Hopeless 3 2  PHQ - 2 Score 4 4  Altered sleeping 3 -  Tired, decreased energy 3 3  Change in appetite 3 3  Feeling bad or failure about yourself  1 1  Trouble concentrating 3 0  Moving slowly or fidgety/restless 0 0  Suicidal thoughts 0 0  PHQ-9 Score 17 -  Difficult doing work/chores Extremely dIfficult Somewhat difficult   PMHx, SurgHx, SocialHx, FamHx, Medications, and Allergies were reviewed in the Visit Navigator and updated as appropriate.   Patient Active Problem List   Diagnosis Date Noted  . B12 deficiency 12/13/2017  . Insulin resistance 12/13/2017  . Depression 10/23/2017  . PCOS (polycystic ovarian syndrome) 04/29/2017  . Morbid obesity (HCC) 04/29/2017  . Amenorrhea 04/29/2017  . H/O gonorrhea   . H/O chlamydia infection    Social History   Tobacco Use  . Smoking status: Never Smoker  . Smokeless tobacco: Never Used  Substance Use Topics  . Alcohol use: Yes    Comment: Occasional   . Drug use: No   Current Medications and Allergies:   Current Outpatient Medications:  .  ALPRAZolam (XANAX) 0.25 MG tablet, Take 1 tablet (0.25 mg total) by mouth 2 (two) times daily as needed for anxiety., Disp: 20 tablet, Rfl: 0 .  cyanocobalamin (,VITAMIN B-12,) 1000 MCG/ML injection, 1000 mcg (1 mg) injection once per week for four weeks, followed by 1000 mcg injection once per month., Disp: 1 mL, Rfl: 15 .  cyanocobalamin (,VITAMIN B-12,) 1000 MCG/ML injection, 1000 mcg (1 mg) injection once per week for three months, Disp: 4 mL, Rfl: 2 .  Dulaglutide (TRULICITY) 1.5 MG/0.5ML SOPN, Inject 1.5 mg into the skin [place] weekly., Disp: 4 pen, Rfl: 2 .  escitalopram (LEXAPRO) 10 MG tablet, Take 1 tablet (10 mg total) by mouth  daily., Disp: 30 tablet, Rfl: 1 .  fluticasone (FLONASE) 50 MCG/ACT nasal spray, USE 2 SPRAYS IN EACH NOSTRIL DAILY AT NIGHT, Disp: , Rfl: 0 .  ibuprofen (ADVIL,MOTRIN) 800 MG tablet, Take 1 tablet (800 mg total) by mouth every 8 (eight) hours as needed for headache., Disp: 20 tablet, Rfl: 0 .  ketorolac (TORADOL) 10 MG tablet, Take 1 tablet (10 mg total) by mouth every 6 (six) hours as needed., Disp: 20 tablet, Rfl: 0 .  metFORMIN (GLUCOPHAGE) 1000 MG tablet, Take 1 tablet (1,000 mg total) by mouth 2 (two) times daily with a meal., Disp: 180 tablet, Rfl: 3 .  phentermine (ADIPEX-P) 37.5 MG tablet, Take 1 tablet (37.5 mg total) by mouth daily before breakfast., Disp: 30 tablet, Rfl: 2 .  promethazine (PHENERGAN) 25 MG tablet, Take 1 tablet (25 mg total) by mouth every 8 (eight) hours as needed for nausea (headache-may causes drowsiness)., Disp: 20 tablet, Rfl: 0 .  Syringe/Needle, Disp, (SYRINGE 3CC/25GX1") 25G X 1" 3 ML MISC, 1 application by Does not apply route once a week. DX d52.9, Disp: 12 each, Rfl: 0  No Known Allergies Review of Systems   Pertinent items are noted in the HPI. Otherwise, ROS is negative.  Vitals:  There were no vitals filed for this visit.   There is no height or weight on file to calculate BMI.  Physical Exam:   Physical Exam  Results for orders placed or performed in visit on 12/30/17  CBC  Result Value Ref Range   WBC 8.0 4.0 - 10.5 K/uL   RBC 4.67 3.87 - 5.11 Mil/uL   Platelets 303.0 150.0 - 400.0 K/uL   Hemoglobin 12.7 12.0 - 15.0 g/dL   HCT 65.739.1 84.636.0 - 96.246.0 %   MCV 83.6 78.0 - 100.0 fl   MCHC 32.6 30.0 - 36.0 g/dL   RDW 95.215.5 84.111.5 - 32.415.5 %  Comprehensive metabolic panel  Result Value Ref Range   Sodium 138 135 - 145 mEq/L   Potassium 4.3 3.5 - 5.1 mEq/L   Chloride 103 96 - 112 mEq/L   CO2 30 19 - 32 mEq/L   Glucose, Bld 79 70 - 99 mg/dL   BUN 10 6 - 23 mg/dL   Creatinine, Ser 4.010.67 0.40 - 1.20 mg/dL   Total Bilirubin 0.3 0.2 - 1.2 mg/dL   Alkaline  Phosphatase 71 39 - 117 U/L   AST 11 0 - 37 U/L   ALT 9 0 - 35 U/L   Total Protein 8.2 6.0 - 8.3 g/dL   Albumin 4.0 3.5 - 5.2 g/dL   Calcium 9.4 8.4 - 02.710.5 mg/dL   GFR 253.66134.99 >44.03>60.00 mL/min    Assessment and Plan:   There are no diagnoses linked to this encounter.  . Reviewed expectations re: course of current medical issues. . Discussed self-management of symptoms. . Outlined signs and symptoms indicating need for more acute intervention. . Patient verbalized understanding and all questions were answered. Marland Kitchen. Health Maintenance issues including appropriate healthy diet, exercise, and smoking avoidance were discussed with patient. . See orders for this visit as documented in the electronic medical record. . Patient received an After Visit Summary.  Helane RimaErica Mattheus Rauls, DO [place], [place] 01/14/2018  Future Appointments  Date Time Provider Department Center  01/15/2018  7:00 AM Helane RimaWallace, Geraldene Eisel, DO [facility]  01/29/2018  8:00 AM Carolan ShiverJohnston, Angela D, RD [facility]DM  02/01/2018  9:00 AM Carolan ShiverJohnston, Angela D, RD [facility]DM  02/11/2018  2:00 PM Vidal SchwalbeAnderson, Jeanne E, PsyD [facility] None  02/18/2018  3:00 PM Vidal SchwalbeAnderson, Jeanne E, PsyD [facility] None  03/16/2018  7:00 AM Helane RimaWallace, Durward Matranga, DO [facility]    *** CMA served as scribe during this visit. History, Physical, and Plan performed by medical provider. The above documentation has been reviewed and is accurate and complete. Helane RimaErica Christeen Lai, D.O.

## 2018-01-15 ENCOUNTER — Ambulatory Visit: Payer: No Typology Code available for payment source | Admitting: Family Medicine

## 2018-01-15 ENCOUNTER — Encounter: Payer: Self-pay | Admitting: Family Medicine

## 2018-01-15 ENCOUNTER — Ambulatory Visit: Payer: Self-pay | Admitting: Registered"

## 2018-01-29 ENCOUNTER — Ambulatory Visit: Payer: Self-pay | Admitting: Registered"

## 2018-02-01 ENCOUNTER — Ambulatory Visit: Payer: Self-pay | Admitting: Registered"

## 2018-02-01 ENCOUNTER — Encounter: Payer: Self-pay | Admitting: Family Medicine

## 2018-02-02 ENCOUNTER — Ambulatory Visit: Payer: Self-pay | Admitting: Registered"

## 2018-02-03 ENCOUNTER — Ambulatory Visit: Payer: Self-pay | Admitting: *Deleted

## 2018-02-09 ENCOUNTER — Encounter: Payer: Self-pay | Admitting: Obstetrics and Gynecology

## 2018-02-10 ENCOUNTER — Encounter: Payer: Self-pay | Admitting: Skilled Nursing Facility1

## 2018-02-10 ENCOUNTER — Encounter: Payer: No Typology Code available for payment source | Attending: General Surgery | Admitting: Skilled Nursing Facility1

## 2018-02-10 DIAGNOSIS — Z6841 Body Mass Index (BMI) 40.0 and over, adult: Secondary | ICD-10-CM | POA: Diagnosis not present

## 2018-02-10 DIAGNOSIS — Z713 Dietary counseling and surveillance: Secondary | ICD-10-CM | POA: Insufficient documentation

## 2018-02-10 NOTE — Progress Notes (Signed)
Pre-Op Assessment Visit:  Pre-Operative RYGB Surgery  Medical Nutrition Therapy:  Appt start time: 1:55  End time:  3:00  Patient was seen on 02/10/2018 for Pre-Operative Nutrition Assessment. [facility] on 02/10/2018.   Pt states her mother passed away in October and is still grieving. Pt states she has been on adipex since December. Pt states she took herself off lexapro. Pt states she does water aerobics 2 days a week and [facility] t[facility]. Pt states she has 2 jobs. Pt reports skipping most of her meals throughout the day and week. Pt states she is working on cutting out soda.   Do not worry about what you are eating just eat every 3-5 hours.  Pt expectation of surgery:to lose weight and feel better  Pt expectation of Dietitian: none  Start weight at [facility]: 326.5 BMI: 56.04  24 hr Dietary Recall: First Meal: protein shake Snack: popcorn Second Meal: chicken salad Snack: peanut butter crackers Third Meal: skipped---salad Snack:  Beverages: soda, flavored water, coffee with creamer and sugar   Encouraged to engage in 150 minutes of moderate physical activity including cardiovascular and weight baring weekly  Handouts given during visit include:  . Pre-Op Goals . Bariatric Surgery Protein Shakes During the appointment today the following Pre-Op Goals were reviewed with the patient: . Maintain or lose weight as instructed by your surgeon . Make healthy food choices . Begin to limit portion sizes . Limited concentrated sugars and fried foods . Keep fat/sugar in the single digits per serving on             food labels . Practice CHEWING your food  (aim for 30 chews per bite or until applesauce consistency) . Practice not drinking 15 minutes before, during, and 30 minutes after each meal/snack . Avoid all carbonated beverages  . Avoid/limit caffeinated beverages  . Avoid  all sugar-sweetened beverages . Consume 3 meals per day; eat every 3-5 hours . Make a list of non-food related activities . Aim for 64-100 ounces of FLUID daily  . Aim for at least 60-80 grams of PROTEIN daily . Look for a liquid protein source that contain ?15 g protein and ?5 g carbohydrate  (ex: shakes, drinks, shots)  -Follow diet recommendations listed below   Energy and Macronutrient Recomendations: Calories: 1500 Carbohydrate: 170 Protein: 112 Fat: 42  Demonstrated degree of understanding via:  Teach Back  Teaching Method Utilized:  Visual Auditory Hands on  Barriers to learning/adherence to lifestyle change: none identified   Patient to call the Nutrition and Diabetes Education Services to enroll in Pre-Op and Post-Op Nutrition Education when surgery date is scheduled.

## 2018-02-11 ENCOUNTER — Ambulatory Visit: Payer: No Typology Code available for payment source | Admitting: Psychiatry

## 2018-02-17 MED ORDER — MEDROXYPROGESTERONE ACETATE 10 MG PO TABS: 20.0000 mg | ORAL_TABLET | Freq: Every day | ORAL | 0 refills | Status: DC

## 2018-02-18 ENCOUNTER — Ambulatory Visit: Payer: No Typology Code available for payment source | Admitting: Psychiatry

## 2018-02-22 ENCOUNTER — Other Ambulatory Visit: Payer: Self-pay | Admitting: Physician Assistant

## 2018-02-22 ENCOUNTER — Other Ambulatory Visit: Payer: Self-pay | Admitting: Family Medicine

## 2018-02-22 NOTE — Telephone Encounter (Signed)
Please advise on refill.

## 2018-02-23 ENCOUNTER — Encounter: Payer: Self-pay | Admitting: Family Medicine

## 2018-02-23 ENCOUNTER — Ambulatory Visit (INDEPENDENT_AMBULATORY_CARE_PROVIDER_SITE_OTHER): Payer: No Typology Code available for payment source | Admitting: Psychology

## 2018-02-23 ENCOUNTER — Ambulatory Visit (INDEPENDENT_AMBULATORY_CARE_PROVIDER_SITE_OTHER): Payer: No Typology Code available for payment source | Admitting: Family Medicine

## 2018-02-23 VITALS — BP 130/88 | HR 99 | Temp 99.5°F | Ht 64.0 in | Wt 323.8 lb

## 2018-02-23 DIAGNOSIS — E282 Polycystic ovarian syndrome: Secondary | ICD-10-CM | POA: Diagnosis not present

## 2018-02-23 DIAGNOSIS — F410 Panic disorder [episodic paroxysmal anxiety] without agoraphobia: Secondary | ICD-10-CM | POA: Diagnosis not present

## 2018-02-23 DIAGNOSIS — F331 Major depressive disorder, recurrent, moderate: Secondary | ICD-10-CM

## 2018-02-23 DIAGNOSIS — E8881 Metabolic syndrome: Secondary | ICD-10-CM

## 2018-02-23 DIAGNOSIS — F341 Dysthymic disorder: Secondary | ICD-10-CM

## 2018-02-23 DIAGNOSIS — E88819 Insulin resistance, unspecified: Secondary | ICD-10-CM

## 2018-02-23 LAB — POCT GLYCOSYLATED HEMOGLOBIN (HGB A1C): Hemoglobin A1C: 5.1 % (ref 4.0–5.6)

## 2018-02-23 MED ORDER — ESCITALOPRAM OXALATE 20 MG PO TABS: 20.0000 mg | ORAL_TABLET | Freq: Every day | ORAL | 0 refills | Status: DC

## 2018-02-23 MED ORDER — ALPRAZOLAM 0.25 MG PO TABS
0.2500 mg | ORAL_TABLET | Freq: Two times a day (BID) | ORAL | 0 refills | Status: DC | PRN
Start: 2018-02-23 — End: 2018-07-23

## 2018-02-23 NOTE — Progress Notes (Signed)
Emily Frank is a 27 y.o. female is here for follow up.  History of Present Illness:   Emily Frank, CMA acting as scribe for Emily Frank.   HPI: Patient in office for follow up and medication refills. She request refill of Xanax. She has been taking with no side effects. She was on Lexapro but has discontinued on her own she did not feel it was helping. She would like to see about starting another medication.   Health Maintenance Due  Topic Date Due  . INFLUENZA VACCINE  02/11/2018   [facility] 02/10/2018 12/11/2017 07/09/2017  Decreased Interest 0 1 2  Down, Depressed, Hopeless 0 3 2  PHQ - 2 Score 0 4 4  Altered sleeping - 3 -  Tired, decreased energy - 3 3  Change in appetite - 3 3  Feeling bad or failure about yourself  - 1 1  Trouble concentrating - 3 0  Moving slowly or fidgety/restless - 0 0  Suicidal thoughts - 0 0  PHQ-9 Score - 17 -  Difficult doing work/chores - Extremely dIfficult Somewhat difficult   PMHx, SurgHx, SocialHx, FamHx, Medications, and Allergies were reviewed in the Visit Navigator and updated as appropriate.   Patient Active Problem List   Diagnosis Date Noted  . Panic attacks 02/23/2018  . B12 deficiency 12/13/2017  . Insulin resistance 12/13/2017  . Depression 10/23/2017  . PCOS (polycystic ovarian syndrome) 04/29/2017  . Morbid obesity (HCC) 04/29/2017   Social History   Tobacco Use  . Smoking status: Never Smoker  . Smokeless tobacco: Never Used  Substance Use Topics  . Alcohol use: Yes    Comment: Occasional   . Drug use: No   Current Medications and Allergies:   .  ALPRAZolam (XANAX) 0.25 MG tablet, Take 1 tablet (0.25 mg total) by mouth 2 (two) times daily as needed for anxiety., Disp: 30 tablet, Rfl: 0 .  Dulaglutide (TRULICITY) 1.5 MG/0.5ML SOPN, Inject 1.5 mg into the skin [place] weekly., Disp: 4 pen, Rfl: 2 .  metFORMIN (GLUCOPHAGE) 1000 MG tablet, Take 1 tablet (1,000 mg total) by mouth 2 (two) times  daily with a meal., Disp: 180 tablet, Rfl: 3  No Known Allergies   Review of Systems   Pertinent items are noted in the HPI. Otherwise, ROS is negative.  Vitals:   Vitals:   02/23/18 1340  BP: 130/88  Pulse: 99  Temp: 99.5 F (37.5 C)  TempSrc: Oral  SpO2: 97%  Weight: (!) 323 lb 12.8 oz (146.9 kg)  Height: 5\' 4"  (1.626 m)     Body mass index is 55.58 kg/m.  Physical Exam:   Physical Exam  Constitutional: She appears well-nourished.  HENT:  Head: Normocephalic and atraumatic.  Eyes: Pupils are equal, round, and reactive to light. EOM are normal.  Neck: Normal range of motion. Neck supple.  Cardiovascular: Normal rate, regular rhythm, normal heart sounds and intact distal pulses.  Pulmonary/Chest: Effort normal.  Abdominal: Soft.  Skin: Skin is warm.  Psychiatric: She has a normal mood and affect. Her behavior is normal.  Nursing note and vitals reviewed.  Results for orders placed or performed in visit on 02/23/18  POCT glycosylated hemoglobin (Hb A1C)  Result Value Ref Range   Hemoglobin A1C 5.1 4.0 - 5.6 %   HbA1c POC (<> result, manual entry)     HbA1c, POC (prediabetic range)     HbA1c, POC (controlled diabetic range)      Assessment and  Plan:   Emily Frank was seen today for follow-up.  Diagnoses and all orders for this visit:  Panic attacks -     ALPRAZolam (XANAX) 0.25 MG tablet; Take 1 tablet (0.25 mg total) by mouth 2 (two) times daily as needed for anxiety.  PCOS (polycystic ovarian syndrome)  Morbid obesity (HCC)  Insulin resistance -     POCT glycosylated hemoglobin (Hb A1C)  Dysthymia -     escitalopram (LEXAPRO) 20 MG tablet; Take 1 tablet (20 mg total) by mouth daily.   . Reviewed expectations re: course of current medical issues. . Discussed self-management of symptoms. . Outlined signs and symptoms indicating need for more acute intervention. . Patient verbalized understanding and all questions were answered. Marland Kitchen. Health Maintenance  issues including appropriate healthy diet, exercise, and smoking avoidance were discussed with patient. . See orders for this visit as documented in the electronic medical record. . Patient received an After Visit Summary.  CMA served as Neurosurgeonscribe during this visit. History, Physical, and Plan performed by medical provider. The above documentation has been reviewed and is accurate and complete. Emily Frank, D.O.  Emily RimaErica Craig Wisnewski, DO [place], [place] [facility] 02/23/2018

## 2018-02-24 ENCOUNTER — Encounter: Payer: No Typology Code available for payment source | Attending: General Surgery | Admitting: Skilled Nursing Facility1

## 2018-02-24 ENCOUNTER — Encounter: Payer: Self-pay | Admitting: Skilled Nursing Facility1

## 2018-02-24 DIAGNOSIS — Z713 Dietary counseling and surveillance: Secondary | ICD-10-CM | POA: Insufficient documentation

## 2018-02-24 DIAGNOSIS — E639 Nutritional deficiency, unspecified: Secondary | ICD-10-CM

## 2018-02-24 DIAGNOSIS — Z6841 Body Mass Index (BMI) 40.0 and over, adult: Secondary | ICD-10-CM | POA: Insufficient documentation

## 2018-02-24 NOTE — Progress Notes (Signed)
RYGB Assessment:  1st employee Appointment.    Pt states her mother passed away in October and is still grieving.  . Avoid alcohol . Aim for 64 fluid ounces of flavored water  . Try different protein flavors   Pt arrives having lost about 4 pounds. Pt arrives with a stress headache.  Pt states her phsyciasn has increased her lexapro and states she will take it for 4-6 weeks before she decides it is or is not working. Pt states she has not been drinking soda. Pt states she has a great support system at work her "work Ecologistfamily/work mom". Pt states she has also been working on not drinking with meals. Pt states people keep telling her she is taking the easy way out which has increased her stress. Pt states her sister has demolished her childhood home to build her own home which has set her grief off again. Pt is now eating 3 meals a day and all balanced when she used to have no consistency with her meals at all. Pt states she was not an emotional eater and never binge ate but does state when she is stressed she has no appetite so it makes eating really hard and causes nausea.   Start weight at [facility]: 326.5 Weight: 322.8 BMI: 55.41  24 hr Dietary Recall: First Meal: protein shake Snack: popcorn Second Meal: chicken salad Snack: peanut butter crackers Third Meal: crablegs and corn and small salad or chicken salad and 1 piece of bread or chicken wrap with fruit salad  Snack:  Beverages: soda, flavored water, coffee with creamer and sugar   Usual physical activity: Walking more  Diet to Follow: 1500 calories 170 g carbohydrates 112 g protein 42 g fat   Nutritional Diagnosis:  [place]-3.3 Overweight/obesity related to past poor dietary habits and physical inactivity as evidenced by patient w/ planned RYGB surgery following dietary guidelines for continued weight loss.    Intervention:  Nutrition counseling for upcoming Bariatric Surgery. Goals: -Encouraged to engage in 150 minutes of moderate  physical activity including cardiovascular and weight baring weekly  Teaching Method Utilized:  Visual Auditory Hands on   Barriers to learning/adherence to lifestyle change: none identified   Demonstrated degree of understanding via:  Teach Back   Monitoring/Evaluation:  Dietary intake, exercise,and body weight prn.

## 2018-02-26 ENCOUNTER — Ambulatory Visit (INDEPENDENT_AMBULATORY_CARE_PROVIDER_SITE_OTHER): Payer: No Typology Code available for payment source | Admitting: Physician Assistant

## 2018-02-26 ENCOUNTER — Encounter: Payer: Self-pay | Admitting: Physician Assistant

## 2018-02-26 VITALS — BP 112/82 | HR 83 | Temp 98.5°F | Ht 64.0 in | Wt 326.2 lb

## 2018-02-26 DIAGNOSIS — J029 Acute pharyngitis, unspecified: Secondary | ICD-10-CM | POA: Diagnosis not present

## 2018-02-26 LAB — POCT RAPID STREP A (OFFICE): Rapid Strep A Screen: NEGATIVE

## 2018-02-26 MED ORDER — AMOXICILLIN-POT CLAVULANATE 875-125 MG PO TABS: 1.0000 | ORAL_TABLET | Freq: Two times a day (BID) | ORAL | 0 refills | Status: DC

## 2018-02-26 MED ORDER — METHYLPREDNISOLONE ACETATE 80 MG/ML IJ SUSP
80.0000 mg | Freq: Once | INTRAMUSCULAR | Status: AC
  Administered 2018-02-26: 80 mg via INTRAMUSCULAR

## 2018-02-26 NOTE — Patient Instructions (Addendum)
It was great to see you!  You have a viral upper respiratory infection. Antibiotics are not needed for this.  Viral infections usually take 7-10 days to resolve.  The cough can last a few weeks to go away.  Use medication as prescribed: Ibuprofen, starting tomorrow  I have sent in an oral antibiotic for you to take, should your symptoms not improve with rest and the steroid injection.  Push fluids and get plenty of rest. Please return if you are not improving as expected, or if you have high fevers (>101.5) or difficulty swallowing or worsening productive cough.  Call clinic with questions.  I hope you start feeling better soon!

## 2018-02-26 NOTE — Progress Notes (Signed)
Emily Frank is a 27 y.o. female here for a new problem.   History of Present Illness:   Chief Complaint  Patient presents with  . Sore Throat    HPI   Reports that she has felt like she's been getting a URI since the beginning of the week. Endorses throat pain, low grade fever, fatigue. She is pushing fluids and has good appetite. Denies GI symptoms. 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 She has not tried anything for her symptoms. She is concerned that she may have strep. Denies recent travel. Denies difficulty breathing, CP, SOB.   Past Medical History:  Diagnosis Date  . Depression   . H/O chlamydia infection   . H/O gonorrhea   . Obesity   . Polycystic ovarian syndrome      Social History   Socioeconomic History  . Marital status: Legally Separated    Spouse name: Not on file  . Number of children: Not on file  . Years of education: Not on file  . Highest education level: Not on file  Occupational History    Employer: [employer]  Social Needs  . Financial resource strain: Not on file  . Food insecurity:    Worry: Not on file    Inability: Not on file  . Transportation needs:    Medical: Not on file    Non-medical: Not on file  Tobacco Use  . Smoking status: Never Smoker  . Smokeless tobacco: Never Used  Substance and Sexual Activity  . Alcohol use: Yes    Comment: Occasional   . Drug use: No  . Sexual activity: Not Currently    Partners: Male    Birth control/protection: None  Lifestyle  . Physical activity:    Days per week: Not on file    Minutes per session: Not on file  . Stress: Not on file  Relationships  . Social connections:    Talks on phone: Not on file    Gets together: Not on file    Attends religious service: Not on file    Active member of club or organization: Not on file    Attends meetings of clubs or organizations: Not on file    Relationship status: Not on file  .  Intimate partner violence:    Fear of current or ex partner: Not on file    Emotionally abused: Not on file    Physically abused: Not on file    Forced sexual activity: Not on file  Other Topics Concern  . Not on file  Social History Narrative  . Not on file    History reviewed. No pertinent surgical history.  Family History  Problem Relation Age of Onset  . Hypertension Mother   . Diabetes Mother   . Hepatitis C Mother   . Renal Disease Mother   . Hypertension Father   . Hepatitis C Father   . Breast cancer Maternal Grandmother   . Brain cancer Maternal Grandmother   . Bone cancer Maternal Grandmother   . Hypertension Maternal Grandmother   . Renal Disease Maternal Grandmother   . Diabetes Maternal Grandmother   . Colon cancer Maternal Grandfather   . Breast cancer Paternal Grandmother   . Hypertension Paternal Grandmother     No Known Allergies  Current Medications:   Current Outpatient Medications:  .  ALPRAZolam (XANAX) 0.25 MG tablet, Take 1 tablet (0.25 mg total) by mouth 2 (two) times daily  as needed for anxiety., Disp: 30 tablet, Rfl: 0 .  amoxicillin-clavulanate (AUGMENTIN) 875-125 MG tablet, Take 1 tablet by mouth 2 (two) times daily., Disp: 20 tablet, Rfl: 0 .  cyanocobalamin (,VITAMIN B-12,) 1000 MCG/ML injection, 1000 mcg (1 mg) injection once per week for four weeks, followed by 1000 mcg injection once per month., Disp: 1 mL, Rfl: 15 .  Dulaglutide (TRULICITY) 1.5 MG/0.5ML SOPN, Inject 1.5 mg into the skin [place] weekly., Disp: 4 pen, Rfl: 2 .  escitalopram (LEXAPRO) 20 MG tablet, Take 1 tablet (20 mg total) by mouth daily., Disp: 90 tablet, Rfl: 0 .  ibuprofen (ADVIL,MOTRIN) 800 MG tablet, Take 1 tablet (800 mg total) by mouth every 8 (eight) hours as needed for headache., Disp: 20 tablet, Rfl: 0 .  ketorolac (TORADOL) 10 MG tablet, Take 1 tablet (10 mg total) by mouth every 6 (six) hours as needed., Disp: 20 tablet, Rfl: 0 .  metFORMIN (GLUCOPHAGE) 1000 MG  tablet, Take 1 tablet (1,000 mg total) by mouth 2 (two) times daily with a meal., Disp: 180 tablet, Rfl: 3 .  promethazine (PHENERGAN) 25 MG tablet, Take 1 tablet (25 mg total) by mouth every 8 (eight) hours as needed for nausea (headache-may causes drowsiness)., Disp: 20 tablet, Rfl: 0  Current Facility-Administered Medications:  .  methylPREDNISolone acetate (DEPO-MEDROL) injection 80 mg, 80 mg, Intramuscular, Once, [place], [place], [place]   Review of Systems:   ROS  Negative unless otherwise specified per HPI.   Vitals:   Vitals:   02/26/18 1146  BP: 112/82  Pulse: 83  Temp: 98.5 F (36.9 C)  TempSrc: Oral  SpO2: 99%  Weight: (!) 326 lb 3.2 oz (148 kg)  Height: 5\' 4"  (1.626 m)     Body mass index is 55.99 kg/m.  Physical Exam:   Physical Exam  Constitutional: She appears well-developed. She is cooperative.  Non-toxic appearance. She does not have a sickly appearance. She does not appear ill. No distress.  HENT:  Head: Normocephalic and atraumatic.  Right Ear: Tympanic membrane, external ear and ear canal normal. Tympanic membrane is not erythematous, not retracted and not bulging.  Left Ear: Tympanic membrane, external ear and ear canal normal. Tympanic membrane is not erythematous, not retracted and not bulging.  Nose: Mucosal edema and rhinorrhea present. Right sinus exhibits no maxillary sinus tenderness. Left sinus exhibits no maxillary sinus tenderness and no frontal sinus tenderness.  Mouth/Throat: Uvula is midline and mucous membranes are normal. Posterior oropharyngeal erythema present. No posterior oropharyngeal edema. Tonsils are 2+ on the right. Tonsils are 2+ on the left. No tonsillar exudate.  Eyes: Conjunctivae and lids are normal.  Neck: Trachea normal.  Cardiovascular: Normal rate, regular rhythm, S1 normal, S2 normal and normal heart sounds.  Pulmonary/Chest: Effort normal and breath sounds normal. She has no decreased breath sounds. She has no wheezes. She  has no rhonchi. She has no rales.  Lymphadenopathy:    She has no cervical adenopathy.  Neurological: She is alert.  Skin: Skin is warm, dry and intact.  Psychiatric: She has a normal mood and affect. Her speech is normal and behavior is normal.  Nursing note and vitals reviewed.    Assessment and Plan:    Jadaya was seen today for sore throat.  Diagnoses and all orders for this visit:  Pharyngitis, unspecified etiology No red flags on exam.  Rapid strep negative. Suspect viral etiology. Received depo-medrol injection per orders, tolerated well. Did provide a wait and see prescription of Augmentin should symptoms  persist. Discussed taking medications as prescribed. Reviewed return precautions including worsening fever, SOB, worsening cough or other concerns. Push fluids and rest. I recommend that patient follow-up if symptoms worsen or persist despite treatment x 7-10 days, sooner if needed. -     POCT rapid strep A  Other orders -     amoxicillin-clavulanate (AUGMENTIN) 875-125 MG tablet; Take 1 tablet by mouth 2 (two) times daily.    . Reviewed expectations re: course of current medical issues. . Discussed self-management of symptoms. . Outlined signs and symptoms indicating need for more acute intervention. . Patient verbalized understanding and all questions were answered. . See orders for this visit as documented in the electronic medical record. . Patient received an After-Visit Summary.    Jarold MottoSamantha Anica Alcaraz, PA-C

## 2018-03-01 ENCOUNTER — Encounter: Payer: No Typology Code available for payment source | Admitting: Registered"

## 2018-03-01 DIAGNOSIS — Z713 Dietary counseling and surveillance: Secondary | ICD-10-CM | POA: Diagnosis not present

## 2018-03-01 DIAGNOSIS — Z6841 Body Mass Index (BMI) 40.0 and over, adult: Secondary | ICD-10-CM | POA: Diagnosis not present

## 2018-03-01 DIAGNOSIS — E669 Obesity, unspecified: Secondary | ICD-10-CM

## 2018-03-03 ENCOUNTER — Telehealth: Payer: Self-pay

## 2018-03-03 ENCOUNTER — Encounter: Payer: No Typology Code available for payment source | Admitting: Skilled Nursing Facility1

## 2018-03-03 ENCOUNTER — Encounter: Payer: Self-pay | Admitting: Skilled Nursing Facility1

## 2018-03-03 DIAGNOSIS — E639 Nutritional deficiency, unspecified: Secondary | ICD-10-CM

## 2018-03-03 NOTE — Telephone Encounter (Signed)
Pt called stating she starting taking the provera as instructed and her bleeding stopped by the 3rd day. Pt states that she has continued to take the provera but now her bleeding is back and significantly heavy. Pt would like to know what to do next. Pt denies any lightheaded or dizziness.

## 2018-03-03 NOTE — Progress Notes (Signed)
RYGB Assessment: 2nd employee Appointment.    Pt states her mother passed away in October and is still grieving.  . Identify your barriers to making changes  Pt arrives having lost about 4 pounds. Pt arrives with a stress headache.  Pt states her phsyciasn has increased her lexapro and states she will take it for 4-6 weeks before she decides it is or is not working. Pt states she has not been drinking soda. Pt states she has a great support system at work her "work Ecologistfamily/work mom". Pt states she has also been working on not drinking with meals. Pt states people keep telling her she is taking the easy way out which has increased her stress. Pt states her sister has demolished her childhood home to build her own home which has set her grief off again. Pt is now eating 3 meals a day and all balanced when she used to have no consistency with her meals at all. Pt states she was not an emotional eater and never binge ate but does state when she is stressed she has no appetite so it makes eating really hard and causes nausea.   Pt states  she has not had any alcohol but has struggle with getting in 64 fluid ounces getting in 36 fluid ounces. Pt states she has not had any soda but is drinking a sugary tea. Pt states she just needs to get her head around surgery and making changes.  Pt was on her phone throughout the appt.   Start weight at [facility]: 326.5 Weight: 322.8 BMI: 55.27  24 hr Dietary Recall: avoiding ice cream First Meal: protein shake and banana  Snack: popcorn Second Meal: chicken salad or hamburger and fry Snack: peanut butter crackers Third Meal: crablegs and corn and small salad or chicken salad and 1 piece of bread or chicken wrap with fruit salad or kale salad with [place] and egg and cheese and ranch Snack:  Beverages: soda, flavored water, coffee with creamer and sugar   Usual physical activity: Walking 4 miles a day  Diet to Follow: 1500 calories 170 g carbohydrates 112 g  protein 42 g fat   Nutritional Diagnosis:  [place]-3.3 Overweight/obesity related to past poor dietary habits and physical inactivity as evidenced by patient w/ planned RYGB surgery following dietary guidelines for continued weight loss.    Intervention:  Nutrition counseling for upcoming Bariatric Surgery. Goals: -Encouraged to engage in 150 minutes of moderate physical activity including cardiovascular and weight baring weekly  Teaching Method Utilized:  Visual Auditory Hands on   Barriers to learning/adherence to lifestyle change: none identified   Demonstrated degree of understanding via:  Teach Back   Monitoring/Evaluation:  Dietary intake, exercise,and body weight prn.

## 2018-03-03 NOTE — Progress Notes (Signed)
  Pre-Operative Nutrition Class:  Appt start time: 8264   End time:  1830.  Patient was seen on 03/03/2018 for Pre-Operative Bariatric Surgery Education at [facility].   Surgery date: 03/29/2018 Surgery type: RYGB Start weight at [facility]: 326.5 Weight today: 322.9   Samples given per MNT protocol. Patient educated on appropriate usage: Bariatric Advantage Multivitamin Lot # [id number] Exp: 04/2019  Bariatric Advantage Calcium Citrate Lot # [id number] Exp: 12/03/2018  Bariatric Advantage Calcium Citrate Lot # [id number] Exp: 11/12/2018  Premier Protein Powder Lot # [id number] Exp: 10/20/2018   The following the learning objectives were met by the patient during this course:  Identify Pre-Op Dietary Goals and will begin 2 weeks pre-operatively  Identify appropriate sources of fluids and proteins   State protein recommendations and appropriate sources pre and post-operatively  Identify Post-Operative Dietary Goals and will follow for 2 weeks post-operatively  Identify appropriate multivitamin and calcium sources  Describe the need for physical activity post-operatively and will follow MD recommendations  State when to call healthcare provider regarding medication questions or post-operative complications  Handouts given during class include:  Pre-Op Bariatric Surgery Diet Handout  Protein Shake Handout  Post-Op Bariatric Surgery Nutrition Handout  BELT Program Information Flyer  Support Group Information Flyer  [facility] Bariatric Supplements Price List  Follow-Up Plan: Patient will follow-up at [facility] 2 weeks post operatively for diet advancement per MD.

## 2018-03-04 ENCOUNTER — Ambulatory Visit (INDEPENDENT_AMBULATORY_CARE_PROVIDER_SITE_OTHER): Payer: No Typology Code available for payment source | Admitting: Psychiatry

## 2018-03-04 DIAGNOSIS — F4323 Adjustment disorder with mixed anxiety and depressed mood: Secondary | ICD-10-CM

## 2018-03-04 DIAGNOSIS — F509 Eating disorder, unspecified: Secondary | ICD-10-CM | POA: Diagnosis not present

## 2018-03-08 ENCOUNTER — Ambulatory Visit: Payer: Self-pay

## 2018-03-09 ENCOUNTER — Ambulatory Visit (INDEPENDENT_AMBULATORY_CARE_PROVIDER_SITE_OTHER): Payer: No Typology Code available for payment source | Admitting: Psychiatry

## 2018-03-09 DIAGNOSIS — F509 Eating disorder, unspecified: Secondary | ICD-10-CM | POA: Diagnosis not present

## 2018-03-09 DIAGNOSIS — F4323 Adjustment disorder with mixed anxiety and depressed mood: Secondary | ICD-10-CM

## 2018-03-10 ENCOUNTER — Encounter: Payer: Self-pay | Admitting: Skilled Nursing Facility1

## 2018-03-10 ENCOUNTER — Encounter: Payer: No Typology Code available for payment source | Admitting: Skilled Nursing Facility1

## 2018-03-10 DIAGNOSIS — E639 Nutritional deficiency, unspecified: Secondary | ICD-10-CM

## 2018-03-10 NOTE — Progress Notes (Signed)
RYGB Assessment: 3rd employee Appointment.   Pt arrives facetimeing someone. Pt arrives having lost about 2 pounds. Pt states she got her wisdom teeth pulled. Pt state she feels positive and motivated and feels she can make changes. Pt states she is drinking about 52 ounces of plain water.   Start weight at [facility]: 326.5 Weight: 320.7 BMI: 55.05  24 hr Dietary Recall: currently on liquid diet due to teeth having been pulled First Meal: protein shake and banana  Snack: popcorn Second Meal: chicken salad or hamburger and fry Snack: peanut butter crackers Third Meal: crablegs and corn and small salad or chicken salad and 1 piece of bread or chicken wrap with fruit salad or kale salad with [place] and egg and cheese and ranch Snack:  Beverages: soda, flavored water, coffee with creamer and sugar   Usual physical activity: Walking 4 miles a day  Diet to Follow: 1500 calories 170 g carbohydrates 112 g protein 42 g fat   Nutritional Diagnosis:  [place]-3.3 Overweight/obesity related to past poor dietary habits and physical inactivity as evidenced by patient w/ planned RYGB surgery following dietary guidelines for continued weight loss.    Intervention:  Nutrition counseling for upcoming Bariatric Surgery. Goals: -Encouraged to engage in 150 minutes of moderate physical activity including cardiovascular and weight baring weekly  Teaching Method Utilized:  Visual Auditory Hands on   Barriers to learning/adherence to lifestyle change: none identified   Demonstrated degree of understanding via:  Teach Back   Monitoring/Evaluation:  Dietary intake, exercise,and body weight prn.

## 2018-03-12 ENCOUNTER — Other Ambulatory Visit: Payer: Self-pay | Admitting: Family Medicine

## 2018-03-12 DIAGNOSIS — F410 Panic disorder [episodic paroxysmal anxiety] without agoraphobia: Secondary | ICD-10-CM

## 2018-03-12 DIAGNOSIS — E8881 Metabolic syndrome: Secondary | ICD-10-CM

## 2018-03-12 DIAGNOSIS — F341 Dysthymic disorder: Secondary | ICD-10-CM

## 2018-03-12 MED ORDER — METFORMIN HCL 1000 MG PO TABS: 1000.0000 mg | ORAL_TABLET | Freq: Two times a day (BID) | ORAL | 3 refills | Status: DC

## 2018-03-12 MED ORDER — ESCITALOPRAM OXALATE 20 MG PO TABS: 20.0000 mg | ORAL_TABLET | Freq: Every day | ORAL | 0 refills | Status: DC

## 2018-03-12 NOTE — Telephone Encounter (Signed)
Please advise on refills.  

## 2018-03-12 NOTE — Telephone Encounter (Signed)
Copied from CRM [id number]. Topic: Quick Communication - Rx Refill/Question >> Mar 12, 2018 10:29 AM Gaynelle AduPoole, Shalonda wrote: Medication:escitalopram (LEXAPRO) 20 MG tablet  ALPRAZolam (XANAX) 0.25 MG tablet    metFORMIN (GLUCOPHAGE) 1000 MG tablet  Has the patient contacted their pharmacy? no  Preferred Pharmacy (with phone number or street name): [facility] [place] [address] [phone] (Phone) [phone] (Fax)  Patient stated she left these medication at her hotel out of town, she is requesting a call on her work number is [phone]. Please advise

## 2018-03-12 NOTE — Telephone Encounter (Signed)
I have notified patient that prescriptions was sent to the pharmacy. Patient verbalized understanding.

## 2018-03-12 NOTE — Telephone Encounter (Signed)
See note

## 2018-03-12 NOTE — Telephone Encounter (Signed)
I have refilled Metformin and Lexapro.  Per chart and [place] Controlled Substance Database, patient obtained refill of Xanax on 03/04/18. I am not comfortable refilling at this time.

## 2018-03-16 ENCOUNTER — Ambulatory Visit: Payer: No Typology Code available for payment source | Admitting: Family Medicine

## 2018-03-22 ENCOUNTER — Ambulatory Visit: Payer: No Typology Code available for payment source | Admitting: Psychology

## 2018-03-22 ENCOUNTER — Ambulatory Visit: Payer: Self-pay | Admitting: Psychology

## 2018-03-24 ENCOUNTER — Ambulatory Visit: Payer: No Typology Code available for payment source | Admitting: Family Medicine

## 2018-03-24 ENCOUNTER — Encounter: Payer: Self-pay | Admitting: Family Medicine

## 2018-03-24 DIAGNOSIS — Z0289 Encounter for other administrative examinations: Secondary | ICD-10-CM

## 2018-03-24 NOTE — Progress Notes (Deleted)
Emily Frank is a 27 y.o. female is here for follow up.  History of Present Illness:   {CMA SCRIBE ATTESTATION}  HPI:   There are no preventive care reminders to display for this patient. Depression screen [facility] 2/9 02/10/2018 12/11/2017 07/09/2017  Decreased Interest 0 1 2  Down, Depressed, Hopeless 0 3 2  PHQ - 2 Score 0 4 4  Altered sleeping - 3 -  Tired, decreased energy - 3 3  Change in appetite - 3 3  Feeling bad or failure about yourself  - 1 1  Trouble concentrating - 3 0  Moving slowly or fidgety/restless - 0 0  Suicidal thoughts - 0 0  PHQ-9 Score - 17 -  Difficult doing work/chores - Extremely dIfficult Somewhat difficult   PMHx, SurgHx, SocialHx, FamHx, Medications, and Allergies were reviewed in the Visit Navigator and updated as appropriate.   Patient Active Problem List   Diagnosis Date Noted  . Panic attacks 02/23/2018  . B12 deficiency 12/13/2017  . Insulin resistance 12/13/2017  . Depression 10/23/2017  . PCOS (polycystic ovarian syndrome) 04/29/2017  . Morbid obesity (HCC) 04/29/2017   Social History   Tobacco Use  . Smoking status: Never Smoker  . Smokeless tobacco: Never Used  Substance Use Topics  . Alcohol use: Yes    Comment: Occasional   . Drug use: No   Current Medications and Allergies:   Current Outpatient Medications:  .  ALPRAZolam (XANAX) 0.25 MG tablet, Take 1 tablet (0.25 mg total) by mouth 2 (two) times daily as needed for anxiety., Disp: 30 tablet, Rfl: 0 .  amoxicillin-clavulanate (AUGMENTIN) 875-125 MG tablet, Take 1 tablet by mouth 2 (two) times daily., Disp: 20 tablet, Rfl: 0 .  cyanocobalamin (,VITAMIN B-12,) 1000 MCG/ML injection, 1000 mcg (1 mg) injection once per week for four weeks, followed by 1000 mcg injection once per month., Disp: 1 mL, Rfl: 15 .  Dulaglutide (TRULICITY) 1.5 MG/0.5ML SOPN, Inject 1.5 mg into the skin [place] weekly., Disp: 4 pen, Rfl: 2 .  escitalopram (LEXAPRO) 20 MG tablet, Take 1 tablet (20 mg total)  by mouth daily., Disp: 90 tablet, Rfl: 0 .  ibuprofen (ADVIL,MOTRIN) 800 MG tablet, Take 1 tablet (800 mg total) by mouth every 8 (eight) hours as needed for headache., Disp: 20 tablet, Rfl: 0 .  ketorolac (TORADOL) 10 MG tablet, Take 1 tablet (10 mg total) by mouth every 6 (six) hours as needed., Disp: 20 tablet, Rfl: 0 .  metFORMIN (GLUCOPHAGE) 1000 MG tablet, Take 1 tablet (1,000 mg total) by mouth 2 (two) times daily with a meal., Disp: 180 tablet, Rfl: 3 .  promethazine (PHENERGAN) 25 MG tablet, Take 1 tablet (25 mg total) by mouth every 8 (eight) hours as needed for nausea (headache-may causes drowsiness)., Disp: 20 tablet, Rfl: 0  No Known Allergies Review of Systems   Pertinent items are noted in the HPI. Otherwise, ROS is negative.  Vitals:  There were no vitals filed for this visit.   There is no height or weight on file to calculate BMI.  Physical Exam:   Physical Exam  Results for orders placed or performed in visit on 02/26/18  POCT rapid strep A  Result Value Ref Range   Rapid Strep A Screen Negative Negative    Assessment and Plan:   There are no diagnoses linked to this encounter.  . Reviewed expectations re: course of current medical issues. . Discussed self-management of symptoms. . Outlined signs and symptoms indicating need  for more acute intervention. . Patient verbalized understanding and all questions were answered. Marland Kitchen Health Maintenance issues including appropriate healthy diet, exercise, and smoking avoidance were discussed with patient. . See orders for this visit as documented in the electronic medical record. . Patient received an After Visit Summary.  *** CMA served as Neurosurgeon during this visit. History, Physical, and Plan performed by medical provider. The above documentation has been reviewed and is accurate and complete. Helane Rima, D.O.  Helane Rima, DO [facility] 03/24/2018

## 2018-03-30 ENCOUNTER — Telehealth: Payer: Self-pay | Admitting: Family Medicine

## 2018-03-30 ENCOUNTER — Telehealth: Payer: No Typology Code available for payment source | Admitting: Nurse Practitioner

## 2018-03-30 DIAGNOSIS — J069 Acute upper respiratory infection, unspecified: Secondary | ICD-10-CM | POA: Diagnosis not present

## 2018-03-30 DIAGNOSIS — R112 Nausea with vomiting, unspecified: Secondary | ICD-10-CM

## 2018-03-30 DIAGNOSIS — K591 Functional diarrhea: Secondary | ICD-10-CM

## 2018-03-30 MED ORDER — ONDANSETRON HCL 4 MG PO TABS: 4.0000 mg | ORAL_TABLET | Freq: Three times a day (TID) | ORAL | 0 refills | Status: DC | PRN

## 2018-03-30 NOTE — Progress Notes (Signed)
We are sorry that you are not feeling well. Here is how we plan to help!  Based on what you have shared with me it looks like you have a Virus that is irritating your GI tract.  Vomiting is the forceful emptying of a portion of the stomach's content through the mouth.  Although nausea and vomiting can make you feel miserable, it's important to remember that these are not diseases, but rather symptoms of an underlying illness.  When we treat short term symptoms, we always caution that any symptoms that persist should be fully evaluated in a medical office.  I have prescribed a medication that will help alleviate your symptoms and allow you to stay hydrated:  Zofran 4 mg 1 tablet every 8 hours as needed for nausea and vomiting can use OTC imodium AD for diarrhea.  HOME CARE:  Drink clear liquids.  This is very important! Dehydration (the lack of fluid) can lead to a serious complication.  Start off with 1 tablespoon every 5 minutes for 8 hours.  You may begin eating bland foods after 8 hours without vomiting.  Start with saltine crackers, white bread, rice, mashed potatoes, applesauce.  After 48 hours on a bland diet, you may resume a normal diet.  Try to go to sleep.  Sleep often empties the stomach and relieves the need to vomit.  GET HELP RIGHT AWAY IF:   Your symptoms do not improve or worsen within 2 days after treatment.  You have a fever for over 3 days.  You cannot keep down fluids after trying the medication.  MAKE SURE YOU:   Understand these instructions.  Will watch your condition.  Will get help right away if you are not doing well or get worse.   Thank you for choosing an e-visit. Your e-visit answers were reviewed by a board certified advanced clinical practitioner to complete your personal care plan. Depending upon the condition, your plan could have included both over the counter or prescription medications. Please review your pharmacy choice. Be sure that the  pharmacy you have chosen is open so that you can pick up your prescription now.  If there is a problem you may message your provider in MyChart to have the prescription routed to another pharmacy. Your safety is important to us. If you have drug allergies check your prescription carefully.  For the next 24 hours, you can use MyChart to ask questions about today's visit, request a non-urgent call back, or ask for a work or school excuse from your e-visit provider. You will get an e-mail in the next two days asking about your experience. I hope that your e-visit has been valuable and will speed your recovery.

## 2018-03-30 NOTE — Telephone Encounter (Signed)
Patient had 3rd no show on 03/24/18. After speaking to Dr. Earlene PlaterWallace via in basket message, Dr. Earlene PlaterWallace advised that we can reschedule once more and to make the patient aware of the no show policy and if another no show or same day cancellation is done we will have to dismiss for [place] due to policy. I left the the patient voicemail to reschedule and made aware of policy. Awaiting patient's call back to schedule.

## 2018-03-30 NOTE — Progress Notes (Signed)
E visit for Flu like symptoms   We are sorry that you are not feeling well.  Here is how we plan to help! Based on what you have shared with me it looks like you may have flu-like symptoms that should be watched but do not seem to indicate anti-viral treatment.  Influenza or "the flu" is   an infection caused by a respiratory virus. The flu virus is highly contagious and persons who did not receive their yearly flu vaccination may "catch" the flu from close contact.  We have anti-viral medications to treat the viruses that cause this infection. They are not a "cure" and only shorten the course of the infection. These prescriptions are most effective when they are given within the first 2 days of "flu" symptoms. Antiviral medication are indicated if you have a high risk of complications from the flu. You should  also consider an antiviral medication if you are in close contact with someone who is at risk. These medications can help patients avoid complications from the flu  but have side effects that you should know. Possible side effects from Tamiflu or oseltamivir include nausea, vomiting, diarrhea, dizziness, headaches, eye redness, sleep problems or other respiratory symptoms. You should not take Tamiflu if you have an allergy to oseltamivir or any to the ingredients in Tamiflu.  Based upon your symptoms and potential risk factors you only need  t ouse OTC meds to treat symptoms.  ANYONE WHO HAS FLU SYMPTOMS SHOULD: . Stay home. The flu is highly contagious and going out or to work exposes others! . Be sure to drink plenty of fluids. Water is fine as well as fruit juices, sodas and electrolyte beverages. You may want to stay away from caffeine or alcohol. If you are nauseated, try taking small sips of liquids. How do you know if you are getting enough fluid? Your urine should be a pale yellow or almost colorless. . Get rest. . Taking a steamy shower or using a humidifier may help nasal congestion  and ease sore throat pain. Using a saline nasal spray works much the same way. . Cough drops, hard candies and sore throat lozenges may ease your cough. . Line up a caregiver. Have someone check on you regularly.   GET HELP RIGHT AWAY IF: . You cannot keep down liquids or your medications. . You become short of breath . Your fell like you are going to pass out or loose consciousness. . Your symptoms persist after you have completed your treatment plan MAKE SURE YOU   Understand these instructions.  Will watch your condition.  Will get help right away if you are not doing well or get worse.  Your e-visit answers were reviewed by a board certified advanced clinical practitioner to complete your personal care plan.  Depending on the condition, your plan could have included both over the counter or prescription medications.  If there is a problem please reply  once you have received a response from your provider.  Your safety is important to us.  If you have drug allergies check your prescription carefully.    You can use MyChart to ask questions about today's visit, request a non-urgent call back, or ask for a work or school excuse for 24 hours related to this e-Visit. If it has been greater than 24 hours you will need to follow up with your provider, or enter a new e-Visit to address those concerns.  You will get an e-mail in the next  two days asking about your experience.  I hope that your e-visit has been valuable and will speed your recovery. Thank you for using e-visits.

## 2018-03-30 NOTE — Telephone Encounter (Signed)
FYI

## 2018-04-01 ENCOUNTER — Ambulatory Visit: Payer: No Typology Code available for payment source | Admitting: Psychology

## 2018-04-07 ENCOUNTER — Ambulatory Visit: Payer: Self-pay | Admitting: Obstetrics and Gynecology

## 2018-04-09 ENCOUNTER — Encounter: Payer: Self-pay | Admitting: Family Medicine

## 2018-04-19 ENCOUNTER — Other Ambulatory Visit: Payer: Self-pay | Admitting: Obstetrics and Gynecology

## 2018-04-20 ENCOUNTER — Ambulatory Visit: Payer: Self-pay | Admitting: Psychology

## 2018-04-20 ENCOUNTER — Encounter: Payer: Self-pay | Admitting: Family Medicine

## 2018-04-20 ENCOUNTER — Ambulatory Visit: Payer: No Typology Code available for payment source | Admitting: Family Medicine

## 2018-04-20 DIAGNOSIS — Z0289 Encounter for other administrative examinations: Secondary | ICD-10-CM

## 2018-04-20 NOTE — Progress Notes (Deleted)
Emily Frank is a 27 y.o. female is here for follow up.  History of Present Illness:   {CMA SCRIBE ATTESTATION}  HPI:   There are no preventive care reminders to display for this patient. Depression screen [facility] 2/9 02/10/2018 12/11/2017 07/09/2017  Decreased Interest 0 1 2  Down, Depressed, Hopeless 0 3 2  PHQ - 2 Score 0 4 4  Altered sleeping - 3 -  Tired, decreased energy - 3 3  Change in appetite - 3 3  Feeling bad or failure about yourself  - 1 1  Trouble concentrating - 3 0  Moving slowly or fidgety/restless - 0 0  Suicidal thoughts - 0 0  PHQ-9 Score - 17 -  Difficult doing work/chores - Extremely dIfficult Somewhat difficult   PMHx, SurgHx, SocialHx, FamHx, Medications, and Allergies were reviewed in the Visit Navigator and updated as appropriate.   Patient Active Problem List   Diagnosis Date Noted  . Panic attacks 02/23/2018  . B12 deficiency 12/13/2017  . Insulin resistance 12/13/2017  . Depression 10/23/2017  . PCOS (polycystic ovarian syndrome) 04/29/2017  . Morbid obesity (HCC) 04/29/2017   Social History   Tobacco Use  . Smoking status: Never Smoker  . Smokeless tobacco: Never Used  Substance Use Topics  . Alcohol use: Yes    Comment: Occasional   . Drug use: No   Current Medications and Allergies:   Current Outpatient Medications:  .  ALPRAZolam (XANAX) 0.25 MG tablet, Take 1 tablet (0.25 mg total) by mouth 2 (two) times daily as needed for anxiety., Disp: 30 tablet, Rfl: 0 .  amoxicillin-clavulanate (AUGMENTIN) 875-125 MG tablet, Take 1 tablet by mouth 2 (two) times daily., Disp: 20 tablet, Rfl: 0 .  cyanocobalamin (,VITAMIN B-12,) 1000 MCG/ML injection, 1000 mcg (1 mg) injection once per week for four weeks, followed by 1000 mcg injection once per month., Disp: 1 mL, Rfl: 15 .  Dulaglutide (TRULICITY) 1.5 MG/0.5ML SOPN, Inject 1.5 mg into the skin [place] weekly., Disp: 4 pen, Rfl: 2 .  escitalopram (LEXAPRO) 20 MG tablet, Take 1 tablet (20 mg total)  by mouth daily., Disp: 90 tablet, Rfl: 0 .  ibuprofen (ADVIL,MOTRIN) 800 MG tablet, Take 1 tablet (800 mg total) by mouth every 8 (eight) hours as needed for headache., Disp: 20 tablet, Rfl: 0 .  ketorolac (TORADOL) 10 MG tablet, Take 1 tablet (10 mg total) by mouth every 6 (six) hours as needed., Disp: 20 tablet, Rfl: 0 .  metFORMIN (GLUCOPHAGE) 1000 MG tablet, Take 1 tablet (1,000 mg total) by mouth 2 (two) times daily with a meal., Disp: 180 tablet, Rfl: 3 .  ondansetron (ZOFRAN) 4 MG tablet, Take 1 tablet (4 mg total) by mouth every 8 (eight) hours as needed for nausea or vomiting., Disp: 20 tablet, Rfl: 0 .  promethazine (PHENERGAN) 25 MG tablet, Take 1 tablet (25 mg total) by mouth every 8 (eight) hours as needed for nausea (headache-may causes drowsiness)., Disp: 20 tablet, Rfl: 0  No Known Allergies Review of Systems   Pertinent items are noted in the HPI. Otherwise, ROS is negative.  Vitals:  There were no vitals filed for this visit.   There is no height or weight on file to calculate BMI.  Physical Exam:   Physical Exam  Results for orders placed or performed in visit on 02/26/18  POCT rapid strep A  Result Value Ref Range   Rapid Strep A Screen Negative Negative    Assessment and Plan:  There are no diagnoses linked to this encounter.  . Reviewed expectations re: course of current medical issues. . Discussed self-management of symptoms. . Outlined signs and symptoms indicating need for more acute intervention. . Patient verbalized understanding and all questions were answered. Marland Kitchen Health Maintenance issues including appropriate healthy diet, exercise, and smoking avoidance were discussed with patient. . See orders for this visit as documented in the electronic medical record. . Patient received an After Visit Summary.  *** CMA served as Neurosurgeon during this visit. History, Physical, and Plan performed by medical provider. The above documentation has been reviewed and is  accurate and complete. Helane Rima, D.O.  Helane Rima, DO [place], [facility] 04/20/2018

## 2018-04-21 ENCOUNTER — Encounter: Payer: Self-pay | Admitting: Family Medicine

## 2018-05-23 IMAGING — US US TRANSVAGINAL NON-OB
1 series · 15 of 25 positions shown · non-contrast
Comparison: None

CLINICAL DATA: Morbid obesity. Chronic menorrhagia. Clinical
suspicion for polycystic ovary syndrome.

EXAM:
TRANSABDOMINAL AND TRANSVAGINAL ULTRASOUND OF PELVIS
TECHNIQUE: Both transabdominal and transvaginal ultrasound examinations of the
pelvis were performed. Transabdominal technique was performed for
global imaging of the pelvis including uterus, ovaries, adnexal
regions, and pelvic cul-de-sac. It was necessary to proceed with
endovaginal exam following the transabdominal exam to visualize the
endometrial stripe and ovaries.

[Series 1: us transvaginal non-ob · 15 of 56 slices shown]
[im 1/56]
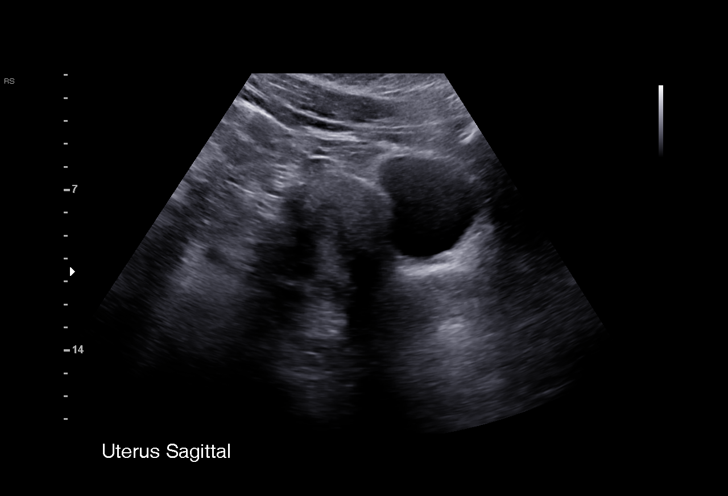
[im 5/56]
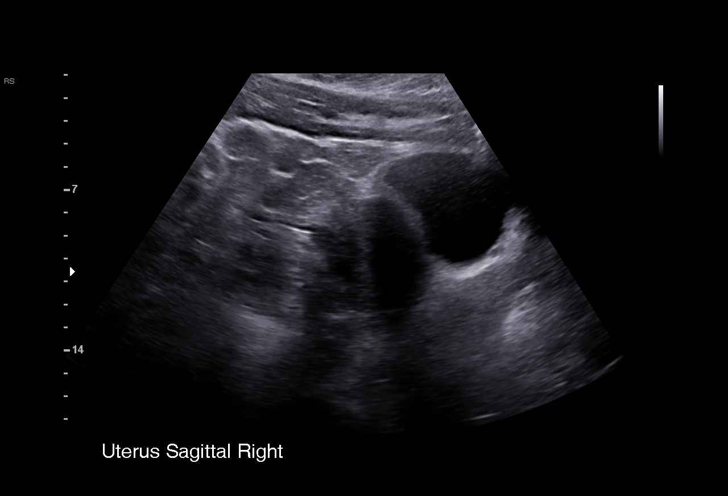
[im 10/56]
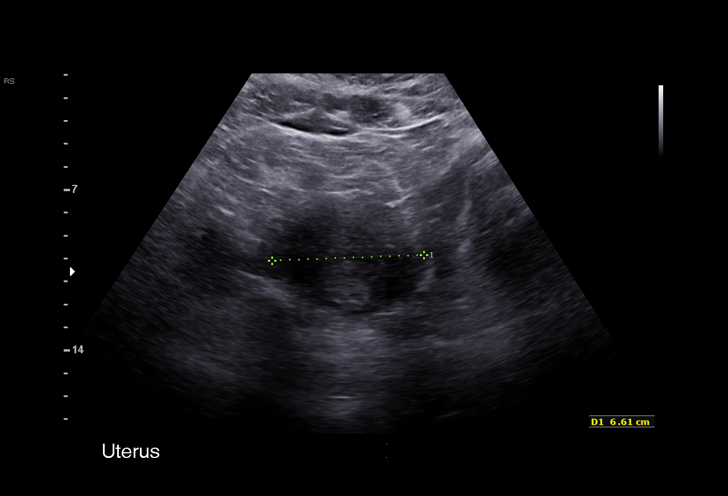
[im 12/56]
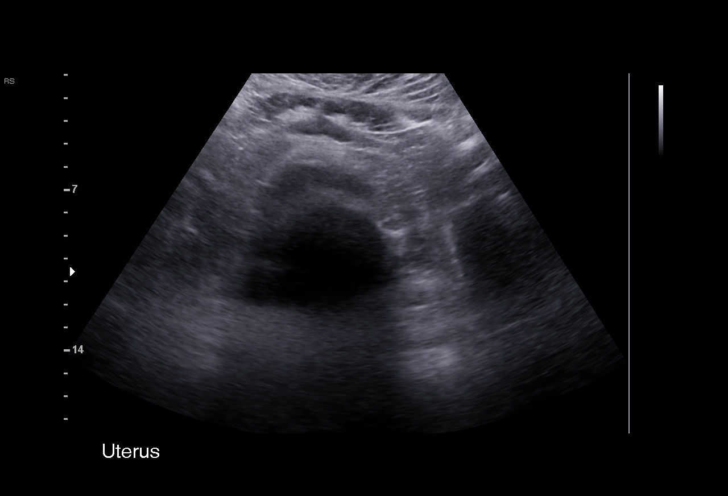
[im 17/56]
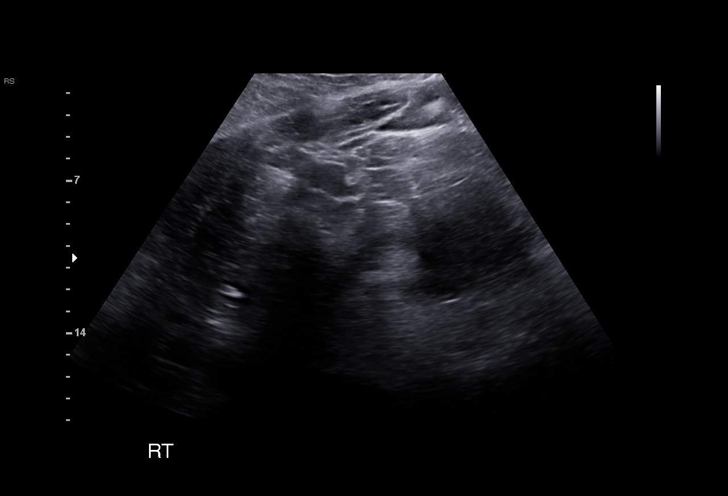
[im 21/56]
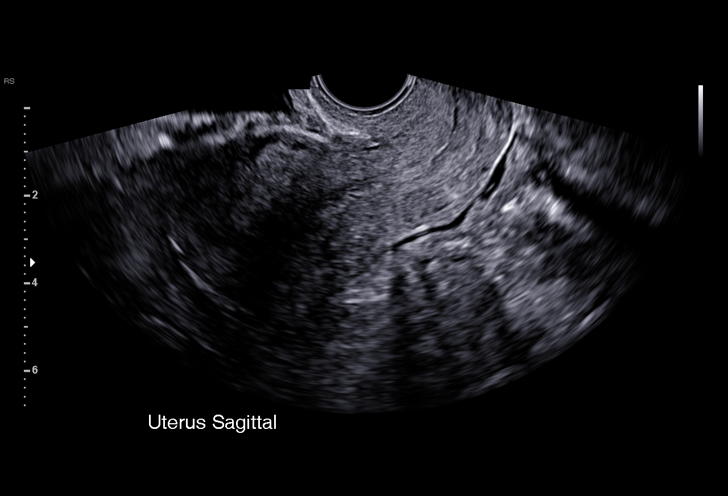
[im 23/56]
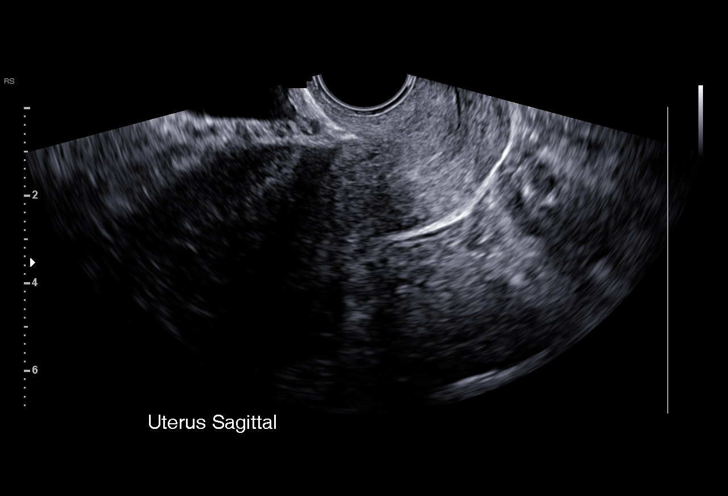
[im 28/56]
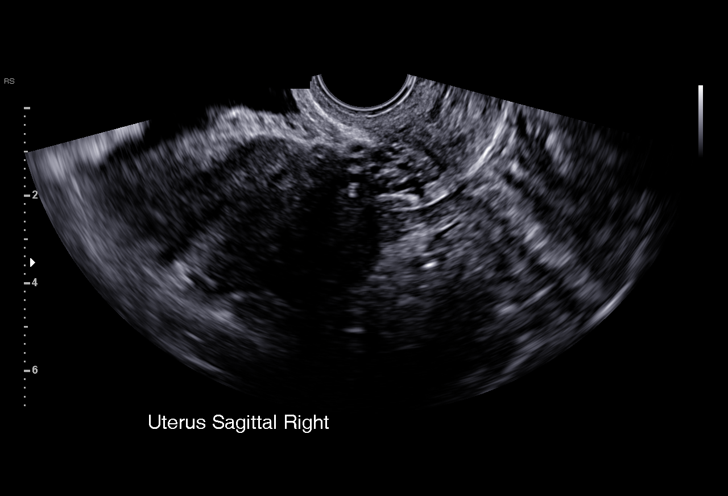
[im 33/56]
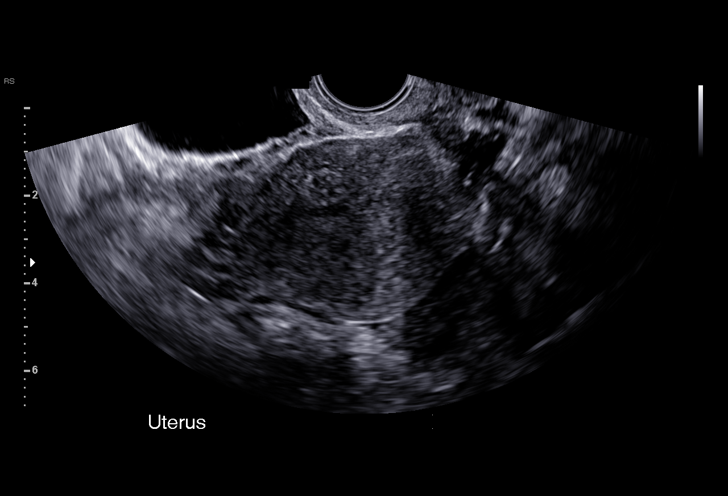
[im 35/56]
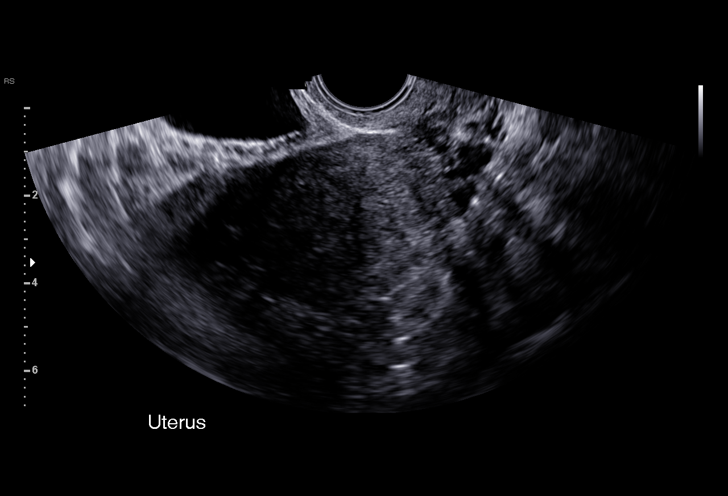
[im 39/56]
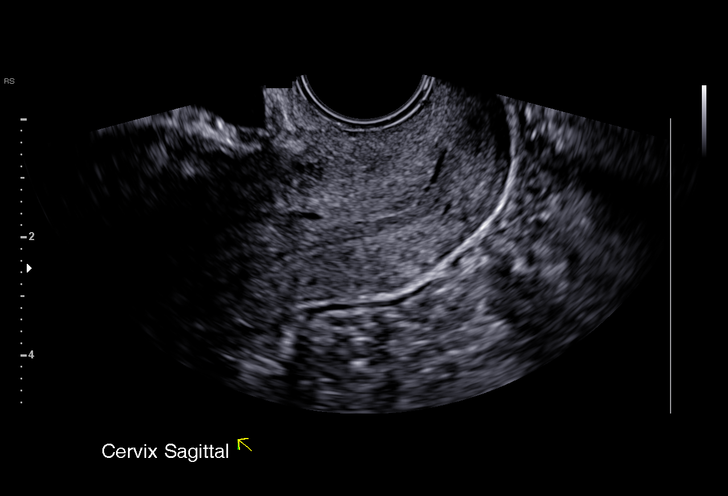
[im 44/56]
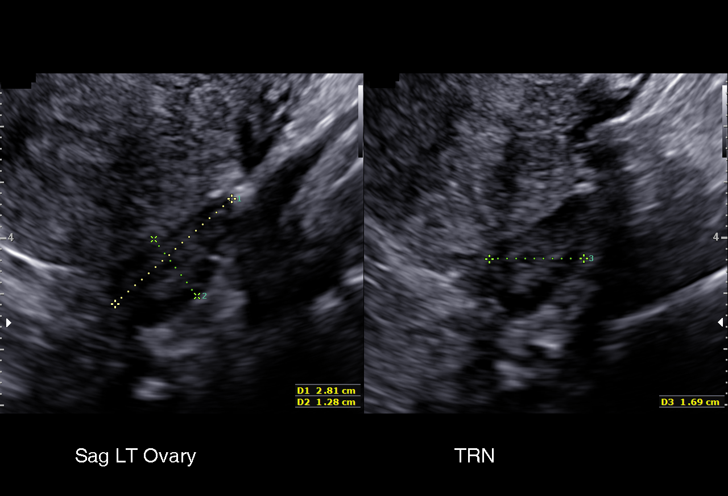
[im 46/56]
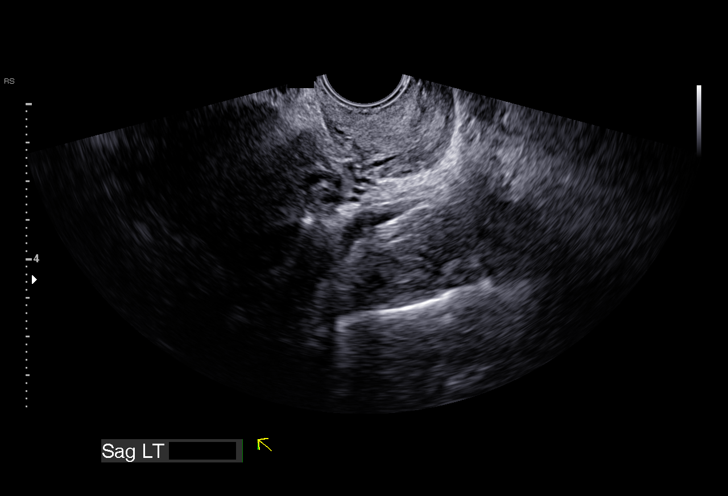
[im 51/56]
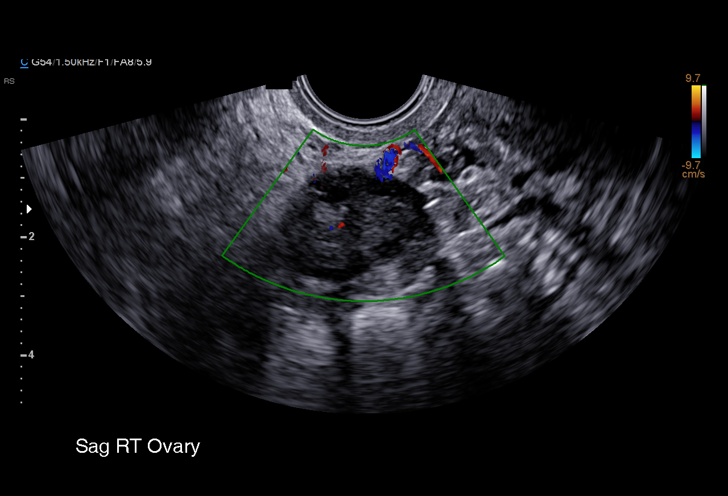
[im 56/56]
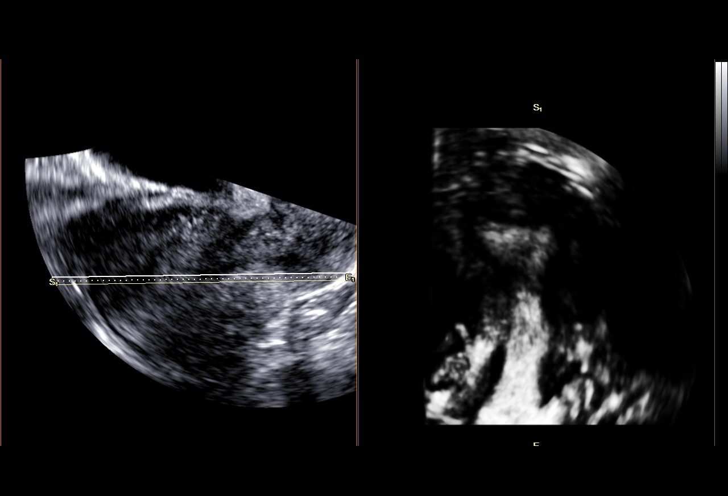

[15 of 25 positions shown; findings below may reference images not displayed]

FINDINGS: Uterus

Measurements: 8.5 x 4.2 x 5.4 cm. No fibroids or other mass
visualized.

Endometrium

Thickness: 7 mm.  No focal abnormality visualized.

Right ovary

Measurements: 2.9 x 1.9 x 2.3 cm (volume = 6.6 cm^3). No evidence
of ovarian or adnexal mass. Multiple less than 1cm follicles are
seen within the ovary, predominantly peripheral in location.

Left ovary

Measurements: 2.8 x 1.3 x 1.7 cm (volume = 3.2 cm^3). No evidence
of ovarian or adnexal mass. Multiple less than 1cm follicles are
seen within the ovary, predominantly peripheral in location.

Other findings

No abnormal free fluid.
IMPRESSION: Bilateral polycystic ovarian morphology noted. Recommend correlation
for accompanying clinical and endocrinologic manifestations of
polycystic ovary syndrome.

No evidence of pelvic mass or free fluid.

## 2018-06-25 ENCOUNTER — Ambulatory Visit: Payer: No Typology Code available for payment source | Attending: Nurse Practitioner | Admitting: Nurse Practitioner

## 2018-06-25 ENCOUNTER — Encounter: Payer: Self-pay | Admitting: Nurse Practitioner

## 2018-06-25 DIAGNOSIS — E538 Deficiency of other specified B group vitamins: Secondary | ICD-10-CM | POA: Diagnosis not present

## 2018-06-25 MED ORDER — DULAGLUTIDE 1.5 MG/0.5ML ~~LOC~~ SOAJ: SUBCUTANEOUS | 2 refills | Status: DC

## 2018-06-25 NOTE — Progress Notes (Signed)
Assessment & Plan:  [name] was seen today for new patient (initial visit).  Diagnoses and all orders for this visit:  Morbid obesity (HCC) -     Dulaglutide (TRULICITY) 1.5 MG/0.5ML SOPN; Inject 1.5 mg into the skin [place] weekly. -     VITAMIN D 25 Hydroxy (Vit-D Deficiency, Fractures) -     Amb Referral to Bariatric Surgery  B12 deficiency -     B12 and Folate Panel    Patient has been counseled on age-appropriate routine health concerns for screening and prevention. These are reviewed and up-to-date. Referrals have been placed accordingly. Immunizations are up-to-date or declined.    Subjective:   Chief Complaint  Patient presents with  . New Patient (Initial Visit)   HPI Emily Frank 27 y.o. female presents to office today to establish care. She has a history of morbid obesity with BMI 54 (Taking trulicity. Could not take phentermine due to insomnia), B12 deficiency (monthly injections), insulin resistance (taking metformin 1000 mg BID), depression, panic attacks and PCOS (declines OCPs.).  Lab Results  Component Value Date   HGBA1C 5.1 02/23/2018   Anxiety and Depression Chronic. Stopped taking lexapro 20mg  as she felt is was not effective. She continues to take xanax 0.25mg  BID prn which is being prescribed by another provider. She does receive counseling for her depression. She currently denies any suicidal ideation.     Morbid Obesity Weight is down today. She is losing weight in preparation for her bariatric surgery. Currently usingTrulicity 1.5mg  weekly. She is also exercising and making dietary modifications.     Review of Systems  Constitutional: Negative for fever, malaise/fatigue and weight loss.  HENT: Negative.  Negative for nosebleeds.   Eyes: Negative.  Negative for blurred vision, double vision and photophobia.  Respiratory: Negative.  Negative for cough and shortness of breath.   Cardiovascular: Negative.  Negative for chest pain, palpitations and leg  swelling.  Gastrointestinal: Positive for nausea. Negative for heartburn and vomiting.  Musculoskeletal: Negative.  Negative for myalgias.  Neurological: Negative.  Negative for dizziness, focal weakness, seizures and headaches.  Psychiatric/Behavioral: Positive for depression. Negative for suicidal ideas. The patient is nervous/anxious.     Past Medical History:  Diagnosis Date  . Depression   . H/O chlamydia infection   . H/O gonorrhea   . Obesity   . Polycystic ovarian syndrome     No past surgical history on file.  Family History  Problem Relation Age of Onset  . Hypertension Mother   . Diabetes Mother   . Hepatitis C Mother   . Renal Disease Mother   . Hypertension Father   . Hepatitis C Father   . Breast cancer Maternal Grandmother   . Brain cancer Maternal Grandmother   . Bone cancer Maternal Grandmother   . Hypertension Maternal Grandmother   . Renal Disease Maternal Grandmother   . Diabetes Maternal Grandmother   . Colon cancer Maternal Grandfather   . Breast cancer Paternal Grandmother   . Hypertension Paternal Grandmother     Social History Reviewed with no changes to be made today.   Outpatient Medications Prior to Visit  Medication Sig Dispense Refill  . metFORMIN (GLUCOPHAGE) 1000 MG tablet Take 1 tablet (1,000 mg total) by mouth 2 (two) times daily with a meal. 180 tablet 3  . ondansetron (ZOFRAN) 4 MG tablet Take 1 tablet (4 mg total) by mouth every 8 (eight) hours as needed for nausea or vomiting. 20 tablet 0  . ALPRAZolam Prudy Feeler)  0.25 MG tablet Take 1 tablet (0.25 mg total) by mouth 2 (two) times daily as needed for anxiety. 30 tablet 0  . cyanocobalamin (,VITAMIN B-12,) 1000 MCG/ML injection 1000 mcg (1 mg) injection once per week for four weeks, followed by 1000 mcg injection once per month. 1 mL 15  . escitalopram (LEXAPRO) 20 MG tablet Take 1 tablet (20 mg total) by mouth daily. (Patient not taking: Reported on 06/25/2018) 90 tablet 0  . ibuprofen  (ADVIL,MOTRIN) 800 MG tablet Take 1 tablet (800 mg total) by mouth every 8 (eight) hours as needed for headache. (Patient not taking: Reported on 06/25/2018) 20 tablet 0  . amoxicillin-clavulanate (AUGMENTIN) 875-125 MG tablet Take 1 tablet by mouth 2 (two) times daily. (Patient not taking: Reported on 06/25/2018) 20 tablet 0  . Dulaglutide (TRULICITY) 1.5 MG/0.5ML SOPN Inject 1.5 mg into the skin [place] weekly. 4 pen 2  . ketorolac (TORADOL) 10 MG tablet Take 1 tablet (10 mg total) by mouth every 6 (six) hours as needed. (Patient not taking: Reported on 06/25/2018) 20 tablet 0  . promethazine (PHENERGAN) 25 MG tablet Take 1 tablet (25 mg total) by mouth every 8 (eight) hours as needed for nausea (headache-may causes drowsiness). (Patient not taking: Reported on 06/25/2018) 20 tablet 0   No facility-administered medications prior to visit.     No Known Allergies     Objective:    BP 112/72   Pulse 84   Temp 98 F (36.7 C) (Oral)   Ht 5\' 4"  (1.626 m)   Wt (!) 319 lb (144.7 kg)   SpO2 100%   BMI 54.76 kg/m  Wt Readings from Last 3 Encounters:  06/25/18 (!) 319 lb (144.7 kg)  03/10/18 (!) 320 lb 11.2 oz (145.5 kg)  03/03/18 (!) 322 lb (146.1 kg)    Physical Exam Vitals signs and nursing note reviewed.  Constitutional:      Appearance: She is well-developed.  HENT:     Head: Normocephalic and atraumatic.  Neck:     Musculoskeletal: Normal range of motion.  Cardiovascular:     Rate and Rhythm: Normal rate and regular rhythm.     Heart sounds: Murmur present. No friction rub. No gallop.   Pulmonary:     Effort: Pulmonary effort is normal. No tachypnea or respiratory distress.     Breath sounds: Normal breath sounds. No decreased breath sounds, wheezing, rhonchi or rales.  Chest:     Chest wall: No tenderness.  Abdominal:     General: Bowel sounds are normal.     Palpations: Abdomen is soft.  Musculoskeletal: Normal range of motion.  Skin:    General: Skin is warm and dry.    Neurological:     Mental Status: She is alert and oriented to person, place, and time.     Coordination: Coordination normal.  Psychiatric:        Behavior: Behavior normal. Behavior is cooperative.        Thought Content: Thought content normal.        Judgment: Judgment normal.       Patient has been counseled extensively about nutrition and exercise as well as the importance of adherence with medications and regular follow-up. The patient was given clear instructions to go to ER or return to medical center if symptoms don't improve, worsen or new problems develop. The patient verbalized understanding.   Follow-up: Return if symptoms worsen or fail to improve.   Claiborne Rigg, FNP-BC [facility]  [facility] [place], [place] [phone]   06/30/2018, 10:34 PM

## 2018-06-26 LAB — B12 AND FOLATE PANEL
FOLATE: 5.1 ng/mL (ref >3.0)
VITAMIN B 12: 689 pg/mL (ref 232–1245)

## 2018-06-26 LAB — VITAMIN D 25 HYDROXY (VIT D DEFICIENCY, FRACTURES): Vit D, 25-Hydroxy: 5.4 ng/mL — ABNORMAL LOW (ref 30.0–100.0)

## 2018-06-30 ENCOUNTER — Encounter: Payer: Self-pay | Admitting: Nurse Practitioner

## 2018-06-30 MED ORDER — VITAMIN D (ERGOCALCIFEROL) 1.25 MG (50000 UNIT) PO CAPS: 50000.0000 [IU] | ORAL_CAPSULE | ORAL | 1 refills | Status: DC

## 2018-07-05 ENCOUNTER — Encounter: Payer: Self-pay | Admitting: Licensed Clinical Social Worker

## 2018-07-05 NOTE — Progress Notes (Signed)
A completed referral faxed to [facility].

## 2018-07-19 ENCOUNTER — Ambulatory Visit
Admission: EM | Admit: 2018-07-19 | Discharge: 2018-07-19 | Disposition: A | Discharge status: Home / Self Care | Payer: No Typology Code available for payment source | Attending: Family Medicine | Admitting: Family Medicine

## 2018-07-19 ENCOUNTER — Encounter: Payer: Self-pay | Admitting: Family Medicine

## 2018-07-19 DIAGNOSIS — K529 Noninfective gastroenteritis and colitis, unspecified: Secondary | ICD-10-CM | POA: Insufficient documentation

## 2018-07-19 DIAGNOSIS — R109 Unspecified abdominal pain: Secondary | ICD-10-CM | POA: Insufficient documentation

## 2018-07-19 MED ORDER — ONDANSETRON 4 MG PO TBDP: 4.0000 mg | ORAL_TABLET | Freq: Three times a day (TID) | ORAL | 0 refills | Status: DC | PRN

## 2018-07-19 NOTE — ED Triage Notes (Signed)
Pt presents to [facility] for assessment of diarrhea, nausea and emesis starting last night.  C/o LLQ abdominal pain associated with it.

## 2018-07-19 NOTE — Discharge Instructions (Addendum)
Please do your best to ensure adequate fluid intake in order to avoid dehydration. If you find that you are unable to tolerate drinking fluids regularly please proceed to the Emergency Department for evaluation.  Also, you should return to the hospital if you experience persistent fevers for greater than 1-2 more days, increasing abdominal pain that persists despite medications, persistent diarrhea, dizziness, syncope (fainting), or for any other concerns you may find worrisome.  

## 2018-07-19 NOTE — ED Notes (Signed)
Patient able to ambulate independently  

## 2018-07-19 NOTE — ED Provider Notes (Signed)
[facility]   [id number] 07/19/18 Arrival Time: 1717  ASSESSMENT & PLAN:  1. Gastroenteritis   2. Abdominal discomfort   Less than 24 hours from onset of illness. No signs of dehydration requiring IVF. Benign abdominal exam. No worries for appendicitis. Reassured and discussed.  Meds ordered this encounter  Medications  . ondansetron (ZOFRAN-ODT) 4 MG disintegrating tablet    Sig: Take 1 tablet (4 mg total) by mouth every 8 (eight) hours as needed for nausea or vomiting.    Dispense:  15 tablet    Refill:  0   Discussed typical duration of symptoms for suspected viral GI illness. Will do her best to ensure adequate fluid intake in order to avoid dehydration. Will proceed to the Emergency Department for evaluation if unable to tolerate PO fluids regularly.  Otherwise she will f/u with her PCP or here if not showing improvement over the next 48-72 hours.  Reviewed expectations re: course of current medical issues. Questions answered. Outlined signs and symptoms indicating need for more acute intervention. Patient verbalized understanding. After Visit Summary given.   SUBJECTIVE: History from: patient.  Emily Frank is a 28 y.o. female who presents with complaint of non-bloody intermittent nausea and vomiting with non-bloody diarrhea. Onset abrupt, yesterday evening. Abdominal discomfort: mild and cramping; started after n/v/d. Symptoms are stable since beginning. Aggravating factors: eating. Alleviating factors: none. Associated symptoms: fatigue. She denies arthralgias, chills, fever, headache and sweats. Appetite: decreased. PO intake: decreased. Ambulatory without assistance. Urinary symptoms: none. Sick contacts: none. Recent travel or camping: none. OTC treatment: none.  Patient's last menstrual period was 06/16/2018.  History reviewed. No pertinent surgical history.  ROS: As per HPI. All other systems negative.   OBJECTIVE:  Vitals:   07/19/18 1725    BP: 134/81  Pulse: 81  Resp: 18  Temp: 98.2 F (36.8 C)  TempSrc: Oral  SpO2: 97%    General appearance: alert; no distress but appears fatigued Oropharynx: moist Lungs: clear to auscultation bilaterally; unlabored Heart: regular rate and rhythm Abdomen: soft; non-distended; no significant abdominal tenderness; reports "cramping" feeling; bowel sounds present; no masses or organomegaly; no guarding or rebound tenderness Back: no CVA tenderness Extremities: no edema; symmetrical with no gross deformities Skin: warm; dry Neurologic: normal gait Psychological: alert and cooperative; normal mood and affect  No Known Allergies                                             Past Medical History:  Diagnosis Date  . Depression   . H/O chlamydia infection   . H/O gonorrhea   . Obesity   . Polycystic ovarian syndrome    Social History   Socioeconomic History  . Marital status: Legally Separated    Spouse name: Not on file  . Number of children: Not on file  . Years of education: Not on file  . Highest education level: Not on file  Occupational History    Employer: [employer]  Social Needs  . Financial resource strain: Not on file  . Food insecurity:    Worry: Not on file    Inability: Not on file  . Transportation needs:    Medical: Not on file    Non-medical: Not on file  Tobacco Use  . Smoking status: Never Smoker  . Smokeless tobacco: Never Used  Substance and Sexual  Activity  . Alcohol use: Yes    Comment: Occasional   . Drug use: No  . Sexual activity: Not Currently    Partners: Male    Birth control/protection: None  Lifestyle  . Physical activity:    Days per week: Not on file    Minutes per session: Not on file  . Stress: Not on file  Relationships  . Social connections:    Talks on phone: Not on file    Gets together: Not on file    Attends religious service: Not on file    Active member of club or organization: Not on  file    Attends meetings of clubs or organizations: Not on file    Relationship status: Not on file  . Intimate partner violence:    Fear of current or ex partner: Not on file    Emotionally abused: Not on file    Physically abused: Not on file    Forced sexual activity: Not on file  Other Topics Concern  . Not on file  Social History Narrative  . Not on file   Family History  Problem Relation Age of Onset  . Hypertension Mother   . Diabetes Mother   . Hepatitis C Mother   . Renal Disease Mother   . Hypertension Father   . Hepatitis C Father   . Breast cancer Maternal Grandmother   . Brain cancer Maternal Grandmother   . Bone cancer Maternal Grandmother   . Hypertension Maternal Grandmother   . Renal Disease Maternal Grandmother   . Diabetes Maternal Grandmother   . Colon cancer Maternal Grandfather   . Breast cancer Paternal Grandmother   . Hypertension Paternal Dulce Sellar, MD 07/21/18 [phone]

## 2018-07-22 DIAGNOSIS — Z008 Encounter for other general examination: Secondary | ICD-10-CM | POA: Diagnosis present

## 2018-07-22 DIAGNOSIS — F411 Generalized anxiety disorder: Secondary | ICD-10-CM | POA: Diagnosis not present

## 2018-07-22 DIAGNOSIS — Z79899 Other long term (current) drug therapy: Secondary | ICD-10-CM | POA: Insufficient documentation

## 2018-07-22 DIAGNOSIS — R45851 Suicidal ideations: Secondary | ICD-10-CM | POA: Insufficient documentation

## 2018-07-22 DIAGNOSIS — F332 Major depressive disorder, recurrent severe without psychotic features: Secondary | ICD-10-CM | POA: Diagnosis not present

## 2018-07-22 DIAGNOSIS — Z7984 Long term (current) use of oral hypoglycemic drugs: Secondary | ICD-10-CM | POA: Insufficient documentation

## 2018-07-23 ENCOUNTER — Inpatient Hospital Stay (HOSPITAL_COMMUNITY)
Admission: AD | Admit: 2018-07-23 | Payer: No Typology Code available for payment source | Source: Intra-hospital | Admitting: Psychiatry

## 2018-07-23 ENCOUNTER — Other Ambulatory Visit: Payer: Self-pay

## 2018-07-23 ENCOUNTER — Emergency Department (HOSPITAL_COMMUNITY)
Admission: EM | Admit: 2018-07-23 | Discharge: 2018-07-23 | Disposition: A | Discharge status: Home / Self Care | Payer: No Typology Code available for payment source | Attending: Emergency Medicine | Admitting: Emergency Medicine

## 2018-07-23 ENCOUNTER — Inpatient Hospital Stay (HOSPITAL_COMMUNITY)
Admission: AD | Admit: 2018-07-23 | Discharge: 2018-07-27 | DRG: 885 | Disposition: A | Discharge status: Home / Self Care | Payer: No Typology Code available for payment source | Source: Intra-hospital | Attending: Psychiatry | Admitting: Psychiatry

## 2018-07-23 ENCOUNTER — Encounter (HOSPITAL_COMMUNITY): Payer: Self-pay | Admitting: Emergency Medicine

## 2018-07-23 ENCOUNTER — Encounter (HOSPITAL_COMMUNITY): Payer: Self-pay

## 2018-07-23 DIAGNOSIS — Z915 Personal history of self-harm: Secondary | ICD-10-CM

## 2018-07-23 DIAGNOSIS — E282 Polycystic ovarian syndrome: Secondary | ICD-10-CM | POA: Diagnosis present

## 2018-07-23 DIAGNOSIS — G4700 Insomnia, unspecified: Secondary | ICD-10-CM | POA: Diagnosis present

## 2018-07-23 DIAGNOSIS — F329 Major depressive disorder, single episode, unspecified: Secondary | ICD-10-CM

## 2018-07-23 DIAGNOSIS — Z6841 Body Mass Index (BMI) 40.0 and over, adult: Secondary | ICD-10-CM

## 2018-07-23 DIAGNOSIS — Z7984 Long term (current) use of oral hypoglycemic drugs: Secondary | ICD-10-CM

## 2018-07-23 DIAGNOSIS — F419 Anxiety disorder, unspecified: Secondary | ICD-10-CM | POA: Diagnosis not present

## 2018-07-23 DIAGNOSIS — F32A Depression, unspecified: Secondary | ICD-10-CM

## 2018-07-23 DIAGNOSIS — Z841 Family history of disorders of kidney and ureter: Secondary | ICD-10-CM | POA: Diagnosis not present

## 2018-07-23 DIAGNOSIS — Z8249 Family history of ischemic heart disease and other diseases of the circulatory system: Secondary | ICD-10-CM | POA: Diagnosis not present

## 2018-07-23 DIAGNOSIS — F410 Panic disorder [episodic paroxysmal anxiety] without agoraphobia: Secondary | ICD-10-CM | POA: Diagnosis present

## 2018-07-23 DIAGNOSIS — Z79899 Other long term (current) drug therapy: Secondary | ICD-10-CM | POA: Diagnosis not present

## 2018-07-23 DIAGNOSIS — F332 Major depressive disorder, recurrent severe without psychotic features: Principal | ICD-10-CM | POA: Diagnosis present

## 2018-07-23 DIAGNOSIS — R45851 Suicidal ideations: Secondary | ICD-10-CM | POA: Diagnosis present

## 2018-07-23 DIAGNOSIS — Z818 Family history of other mental and behavioral disorders: Secondary | ICD-10-CM

## 2018-07-23 LAB — COMPREHENSIVE METABOLIC PANEL
ALT: 10 U/L (ref 0–44)
AST: 14 U/L — ABNORMAL LOW (ref 15–41)
Albumin: 3.6 g/dL (ref 3.5–5.0)
Alkaline Phosphatase: 52 U/L (ref 38–126)
Anion gap: 8 (ref 5–15)
BUN: 7 mg/dL (ref 6–20)
CO2: 25 mmol/L (ref 22–32)
CREATININE: 0.76 mg/dL (ref 0.44–1.00)
Calcium: 8.7 mg/dL — ABNORMAL LOW (ref 8.9–10.3)
Chloride: 106 mmol/L (ref 98–111)
GFR calc Af Amer: >60 mL/min (ref >60)
Glucose, Bld: 95 mg/dL (ref 70–99)
Potassium: 3.8 mmol/L (ref 3.5–5.1)
Sodium: 139 mmol/L (ref 135–145)
Total Bilirubin: 0.6 mg/dL (ref 0.3–1.2)
Total Protein: 7.4 g/dL (ref 6.5–8.1)

## 2018-07-23 LAB — CBC
HCT: 39.5 % (ref 36.0–46.0)
Hemoglobin: 11.8 g/dL — ABNORMAL LOW (ref 12.0–15.0)
MCH: 25.7 pg — ABNORMAL LOW (ref 26.0–34.0)
MCHC: 29.9 g/dL — ABNORMAL LOW (ref 30.0–36.0)
MCV: 85.9 fL (ref 80.0–100.0)
Platelets: 295 10*3/uL (ref 150–400)
RBC: 4.6 MIL/uL (ref 3.87–5.11)
RDW: 15.9 % — ABNORMAL HIGH (ref 11.5–15.5)
WBC: 10.6 10*3/uL — ABNORMAL HIGH (ref 4.0–10.5)
nRBC: 0 % (ref 0.0–0.2)

## 2018-07-23 LAB — RAPID URINE DRUG SCREEN, HOSP PERFORMED
Amphetamines: NOT DETECTED
Barbiturates: NOT DETECTED
Benzodiazepines: NOT DETECTED
Cocaine: NOT DETECTED
Opiates: NOT DETECTED
Tetrahydrocannabinol: NOT DETECTED

## 2018-07-23 LAB — I-STAT BETA HCG BLOOD, ED (MC, WL, AP ONLY): I-stat hCG, quantitative: <5 m[IU]/mL (ref <5)

## 2018-07-23 LAB — ACETAMINOPHEN LEVEL: Acetaminophen (Tylenol), Serum: <10 ug/mL — ABNORMAL LOW (ref 10–30)

## 2018-07-23 LAB — SALICYLATE LEVEL

## 2018-07-23 LAB — ETHANOL

## 2018-07-23 MED ORDER — MAGNESIUM HYDROXIDE 400 MG/5ML PO SUSP: 30.0000 mL | Freq: Every day | ORAL | Status: DC | PRN

## 2018-07-23 MED ORDER — HYDROXYZINE HCL 50 MG PO TABS: 50.0000 mg | ORAL_TABLET | Freq: Four times a day (QID) | ORAL | Status: DC | PRN

## 2018-07-23 MED ORDER — ACETAMINOPHEN 325 MG PO TABS: 650.0000 mg | ORAL_TABLET | Freq: Four times a day (QID) | ORAL | Status: DC | PRN

## 2018-07-23 MED ORDER — ARIPIPRAZOLE 2 MG PO TABS
2.0000 mg | ORAL_TABLET | Freq: Every day | ORAL | Status: DC
  Administered 2018-07-23 – 2018-07-25 (×3): 2 mg via ORAL
  Filled 2018-07-23 (×6): qty 1

## 2018-07-23 MED ORDER — CLONAZEPAM 0.5 MG PO TABS: 0.5000 mg | ORAL_TABLET | Freq: Two times a day (BID) | ORAL | Status: DC | PRN

## 2018-07-23 MED ORDER — TRAZODONE HCL 50 MG PO TABS
50.0000 mg | ORAL_TABLET | Freq: Every evening | ORAL | Status: DC | PRN
  Administered 2018-07-23: 50 mg via ORAL
  Filled 2018-07-23: qty 1

## 2018-07-23 MED ORDER — HYDROXYZINE HCL 25 MG PO TABS
25.0000 mg | ORAL_TABLET | Freq: Four times a day (QID) | ORAL | Status: DC | PRN
  Administered 2018-07-23 – 2018-07-27 (×4): 25 mg via ORAL
  Filled 2018-07-23 (×4): qty 1

## 2018-07-23 MED ORDER — METFORMIN HCL 500 MG PO TABS
1000.0000 mg | ORAL_TABLET | Freq: Two times a day (BID) | ORAL | Status: DC
  Administered 2018-07-23: 1000 mg via ORAL
  Filled 2018-07-23 (×14): qty 2

## 2018-07-23 MED ORDER — TRAZODONE HCL 100 MG PO TABS
100.0000 mg | ORAL_TABLET | Freq: Every evening | ORAL | Status: DC | PRN
  Filled 2018-07-23 (×4): qty 1

## 2018-07-23 MED ORDER — ALUM & MAG HYDROXIDE-SIMETH 200-200-20 MG/5ML PO SUSP
30.0000 mL | ORAL | Status: DC | PRN
  Administered 2018-07-24 (×2): 30 mL via ORAL
  Filled 2018-07-23 (×2): qty 30

## 2018-07-23 MED ORDER — SERTRALINE HCL 100 MG PO TABS
100.0000 mg | ORAL_TABLET | Freq: Every day | ORAL | Status: DC
  Administered 2018-07-23 – 2018-07-24 (×2): 100 mg via ORAL
  Filled 2018-07-23 (×6): qty 1

## 2018-07-23 NOTE — Tx Team (Signed)
Initial Treatment Plan 07/23/2018 6:17 AM Rich Number [id number]    PATIENT STRESSORS: Loss of mom Other: relationship problems   PATIENT STRENGTHS: Ability for insight Capable of independent living Motivation for treatment/growth Work skills   PATIENT IDENTIFIED PROBLEMS: "Depression"  "Anxiety"  "Anger"  "Suicide Thoughts"               DISCHARGE CRITERIA:  Improved stabilization in mood, thinking, and/or behavior Motivation to continue treatment in a less acute level of care Verbal commitment to aftercare and medication compliance  PRELIMINARY DISCHARGE PLAN: Return to previous living arrangement Return to previous work or school arrangements  PATIENT/FAMILY INVOLVEMENT: This treatment plan has been presented to and reviewed with the patient, Emily Frank.  The patient  Has been given the opportunity to ask questions and make suggestions.  Curly Rim, RN 07/23/2018, 6:17 AM

## 2018-07-23 NOTE — Progress Notes (Signed)
Patient ID: KHRYSTYNE HENEGAR, female   DOB: 21-Mar-1991, 28 y.o.   MRN: [id number]  Pt currently presents with a flat affect and depressed behavior. Pt reports to writer that their goal is to "not be so depressed and angry." Pt isolates herself in her room this morning. Pt reports she is willing to start medications today after speaking with MD. Requests for the MD to speak with her therapist. Pt reports okay sleep with current medication regimen.   Pt provided with medications per providers orders. Pt's labs and vitals were monitored throughout the night. Pt supported emotionally and encouraged to express concerns and questions. Pt educated on medications and encouraged to be involved in the milieu.   Pt's safety ensured with 15 minute and environmental checks. Pt currently denies SI/HI and A/V hallucinations. Pt verbally agrees to seek staff if SI/HI or A/VH occurs and to consult with staff before acting on any harmful thoughts. Will continue POC.

## 2018-07-23 NOTE — ED Triage Notes (Signed)
Pt has a hx of mental health issues and went to see her therapist yesterday and had a break down in her emergency therapy session, she was told if she wasn't better today to come to the ED Pt has tried to hurt herself in the past but hasn't done anything recent Pt states she's SI with no plan

## 2018-07-23 NOTE — ED Notes (Signed)
Bed: [facility] Expected date:  Expected time:  Means of arrival:  Comments: 

## 2018-07-23 NOTE — Tx Team (Signed)
Interdisciplinary Treatment and Diagnostic Plan Update  07/23/2018 Time of Session:   Emily Frank MRN: [id number]  Principal Diagnosis: <principal problem not specified>  Secondary Diagnoses: Active Problems:   MDD (major depressive disorder), recurrent episode, severe (HCC)   Current Medications:  Current Facility-Administered Medications  Medication Dose Route Frequency Provider Last Rate Last Dose  . acetaminophen (TYLENOL) tablet 650 mg  650 mg Oral Q6H PRN Laverle Hobby, PA-C      . alum & mag hydroxide-simeth (MAALOX/MYLANTA) 200-200-20 MG/5ML suspension 30 mL  30 mL Oral Q4H PRN Patriciaann Clan E, PA-C      . hydrOXYzine (ATARAX/VISTARIL) tablet 50 mg  50 mg Oral Q6H PRN Patriciaann Clan E, PA-C      . magnesium hydroxide (MILK OF MAGNESIA) suspension 30 mL  30 mL Oral Daily PRN Laverle Hobby, PA-C      . traZODone (DESYREL) tablet 100 mg  100 mg Oral QHS,MR X 1 Simon, Spencer E, PA-C       PTA Medications: Medications Prior to Admission  Medication Sig Dispense Refill Last Dose  . CLONAZEPAM PO Take 0.5 mg by mouth 2 (two) times daily.    Past Month at Unknown time  . Dulaglutide (TRULICITY) 1.5 KP/5.4SF SOPN Inject 1.5 mg into the skin [place] weekly. (Patient taking differently: Inject 1.5 mg into the skin [place] weekly. mondays) 4 pen 2 Past Month at Unknown time  . metFORMIN (GLUCOPHAGE) 1000 MG tablet Take 1 tablet (1,000 mg total) by mouth 2 (two) times daily with a meal. 180 tablet 3 Past Month at Unknown time    Patient Stressors: Loss of mom Other: relationship problems  Patient Strengths: Ability for insight Capable of independent living Motivation for treatment/growth Work skills  Treatment Modalities: Medication Management, Group therapy, Case management,  1 to 1 session with clinician, Psychoeducation, Recreational therapy.   Physician Treatment Plan for Primary Diagnosis: <principal problem not specified> Long Term Goal(s):     Short Term Goals:     Medication Management: Evaluate patient's response, side effects, and tolerance of medication regimen.  Therapeutic Interventions: 1 to 1 sessions, Unit Group sessions and Medication administration.  Evaluation of Outcomes: Not Met  Physician Treatment Plan for Secondary Diagnosis: Active Problems:   MDD (major depressive disorder), recurrent episode, severe ([place])  Long Term Goal(s):     Short Term Goals:       Medication Management: Evaluate patient's response, side effects, and tolerance of medication regimen.  Therapeutic Interventions: 1 to 1 sessions, Unit Group sessions and Medication administration.  Evaluation of Outcomes: Not Met   RN Treatment Plan for Primary Diagnosis: <principal problem not specified> Long Term Goal(s): Knowledge of disease and therapeutic regimen to maintain health will improve  Short Term Goals: Ability to participate in decision making will improve, Ability to verbalize feelings will improve, Ability to disclose and discuss suicidal ideas and Ability to identify and develop effective coping behaviors will improve  Medication Management: RN will administer medications as ordered by provider, will assess and evaluate patient's response and provide education to patient for prescribed medication. RN will report any adverse and/or side effects to prescribing provider.  Therapeutic Interventions: 1 on 1 counseling sessions, Psychoeducation, Medication administration, Evaluate responses to treatment, Monitor vital signs and CBGs as ordered, Perform/monitor CIWA, COWS, AIMS and Fall Risk screenings as ordered, Perform wound care treatments as ordered.  Evaluation of Outcomes: Not Met   LCSW Treatment Plan for Primary Diagnosis: <principal problem not specified> Long Term Goal(s): Safe  transition to appropriate next level of care at discharge, Engage patient in therapeutic group addressing interpersonal concerns.  Short Term Goals: Engage patient in  aftercare planning with referrals and resources  Therapeutic Interventions: Assess for all discharge needs, 1 to 1 time with Social worker, Explore available resources and support systems, Assess for adequacy in community support network, Educate family and significant other(s) on suicide prevention, Complete Psychosocial Assessment, Interpersonal group therapy.  Evaluation of Outcomes: Not Met   Progress in Treatment: Attending groups: No. Participating in groups: No. Taking medication as prescribed: Yes. Toleration medication: Yes. Family/Significant other contact made: No, will contact:  if patient consents to collateral contacts Patient understands diagnosis: Yes. Discussing patient identified problems/goals with staff: Yes. Medical problems stabilized or resolved: Yes. Denies suicidal/homicidal ideation: Yes. Issues/concerns per patient self-inventory: No. Other:   New problem(s) identified: None   New Short Term/Long Term Goal(s): medication stabilization, elimination of SI thoughts, development of comprehensive mental wellness plan.   Patient Goals:  I want help my anger and depression  Discharge Plan or Barriers: Patient is an established patient with [facility] for medication management and therapy services. CSW will continue to follow and assess for any additional referrals and discharge planning.   Reason for Continuation of Hospitalization: Anxiety Depression Medication stabilization Suicidal ideation  Estimated Length of Stay:07/28/2018  Attendees: Patient: 07/23/2018 1:48 PM  Physician: Dr. Neita Garnet, MD 07/23/2018 1:48 PM  Nursing: Clarise Cruz.Carlean Jews, RN 07/23/2018 1:48 PM  RN Care Manager: 07/23/2018 1:48 PM  Social Worker: Radonna Ricker, [place] 07/23/2018 1:48 PM  Recreational Therapist:  07/23/2018 1:48 PM  Other: Mable Paris, NP  07/23/2018 1:48 PM  Other:  07/23/2018 1:48 PM  Other: 07/23/2018 1:48 PM    Scribe for Treatment Team: Marylee Floras,  [place] 07/23/2018 1:48 PM

## 2018-07-23 NOTE — Discharge Instructions (Addendum)
[facility]

## 2018-07-23 NOTE — ED Provider Notes (Addendum)
[facility]-EMERGENCY DEPT Provider Note   CSN: [id number] Arrival date & time: 07/22/18  11-21-2356     History   Chief Complaint No chief complaint on file.   HPI Emily Frank is a 28 y.o. female.  Patient to ED with complaint of depression with suicidal thoughts. She reports she sometimes feels angry enough to hurt others. No AVH. No self harm attempt prior to arrival. History of depression since her mother passed away in 11-21-2016.   The history is provided by the patient. No language interpreter was used.    Past Medical History:  Diagnosis Date  . Depression   . H/O chlamydia infection   . H/O gonorrhea   . Obesity   . Polycystic ovarian syndrome     Patient Active Problem List   Diagnosis Date Noted  . Panic attacks 02/23/2018  . B12 deficiency 12/13/2017  . Insulin resistance 12/13/2017  . Depression 10/23/2017  . PCOS (polycystic ovarian syndrome) 04/29/2017  . Morbid obesity (HCC) 04/29/2017    History reviewed. No pertinent surgical history.   OB History    Gravida  1   Para  0   Term  0   Preterm  0   AB  1   Living  0     SAB  1   TAB  0   Ectopic  0   Multiple  0   Live Births  0            Home Medications    Prior to Admission medications   Medication Sig Start Date End Date Taking? Authorizing Provider  cholecalciferol (VITAMIN D3) 25 MCG (1000 UT) tablet Take 1,000 Units by mouth daily.   Yes [provider]  CLONAZEPAM PO Take by mouth.   Yes [provider]  Dulaglutide (TRULICITY) 1.5 MG/0.5ML SOPN Inject 1.5 mg into the skin  weekly. 06/25/18  Yes Claiborne Rigg, NP  metFORMIN (GLUCOPHAGE) 1000 MG tablet Take 1 tablet (1,000 mg total) by mouth 2 (two) times daily with a meal. 03/12/18  Yes Jarold Motto, PA  sertraline (ZOLOFT) 100 MG tablet Take 100 mg by mouth daily.   Yes [provider]  TRAZODONE HCL PO Take by mouth.   Yes [provider]  ALPRAZolam  (XANAX) 0.25 MG tablet Take 1 tablet (0.25 mg total) by mouth 2 (two) times daily as needed for anxiety. Patient not taking: Reported on 07/23/2018 02/23/18   Helane Rima, DO  cyanocobalamin (,VITAMIN B-12,) 1000 MCG/ML injection 1000 mcg (1 mg) injection once per week for four weeks, followed by 1000 mcg injection once per month. Patient not taking: Reported on 07/23/2018 07/09/17   Helane Rima, DO  escitalopram (LEXAPRO) 20 MG tablet Take 1 tablet (20 mg total) by mouth daily. Patient not taking: Reported on 06/25/2018 03/12/18   Jarold Motto, PA  ibuprofen (ADVIL,MOTRIN) 800 MG tablet Take 1 tablet (800 mg total) by mouth every 8 (eight) hours as needed for headache. Patient not taking: Reported on 06/25/2018 12/28/17   Zachery Dauer, NP  ondansetron (ZOFRAN) 4 MG tablet Take 1 tablet (4 mg total) by mouth every 8 (eight) hours as needed for nausea or vomiting. Patient not taking: Reported on 07/23/2018 03/30/18   Bennie Pierini, FNP  ondansetron (ZOFRAN-ODT) 4 MG disintegrating tablet Take 1 tablet (4 mg total) by mouth every 8 (eight) hours as needed for nausea or vomiting. 07/19/18   Mardella Layman, MD  Vitamin D, Ergocalciferol, (DRISDOL) 1.25 MG (50000 UT)  CAPS capsule Take 1 capsule (50,000 Units total) by mouth every 7 (seven) days. Patient not taking: Reported on 07/23/2018 06/30/18   Claiborne Rigg, NP    Family History Family History  Problem Relation Age of Onset  . Hypertension Mother   . Diabetes Mother   . Hepatitis C Mother   . Renal Disease Mother   . Hypertension Father   . Hepatitis C Father   . Breast cancer Maternal Grandmother   . Brain cancer Maternal Grandmother   . Bone cancer Maternal Grandmother   . Hypertension Maternal Grandmother   . Renal Disease Maternal Grandmother   . Diabetes Maternal Grandmother   . Colon cancer Maternal Grandfather   . Breast cancer Paternal Grandmother   . Hypertension Paternal Grandmother     Social History Social  History   Tobacco Use  . Smoking status: Never Smoker  . Smokeless tobacco: Never Used  Substance Use Topics  . Alcohol use: Yes    Comment: Occasional   . Drug use: No     Allergies   Patient has no known allergies.   Review of Systems Review of Systems  Constitutional: Negative for chills and fever.  HENT: Negative.   Respiratory: Negative.   Cardiovascular: Negative.   Gastrointestinal: Negative.   Musculoskeletal: Negative.   Skin: Negative.   Neurological: Negative.   Psychiatric/Behavioral: Positive for dysphoric mood and suicidal ideas. Negative for self-injury.     Physical Exam Updated Vital Signs BP 139/87 (BP Location: Left Arm)   Pulse 94   Temp 98.4 F (36.9 C) (Oral)   Resp 16   Ht 5\' 4"  (1.626 m)   Wt (!) 147 kg   SpO2 98%   BMI 55.61 kg/m   Physical Exam Vitals signs and nursing note reviewed.  Constitutional:      Appearance: She is well-developed.  HENT:     Head: Normocephalic.  Neck:     Musculoskeletal: Normal range of motion and neck supple.  Cardiovascular:     Rate and Rhythm: Normal rate and regular rhythm.  Pulmonary:     Effort: Pulmonary effort is normal.     Breath sounds: Normal breath sounds. No wheezing, rhonchi or rales.  Abdominal:     General: Bowel sounds are normal.     Palpations: Abdomen is soft.     Tenderness: There is no abdominal tenderness. There is no guarding or rebound.  Musculoskeletal: Normal range of motion.  Skin:    General: Skin is warm and dry.     Findings: No rash.  Neurological:     Mental Status: She is alert and oriented to person, place, and time.  Psychiatric:        Attention and Perception: Attention normal.        Mood and Affect: Mood is depressed.        Behavior: Behavior is slowed.        Thought Content: Thought content includes suicidal ideation. Thought content does not include suicidal plan.      ED Treatments / Results  Labs (all labs ordered are listed, but only  abnormal results are displayed) Labs Reviewed  COMPREHENSIVE METABOLIC PANEL  ETHANOL  SALICYLATE LEVEL  ACETAMINOPHEN LEVEL  CBC  RAPID URINE DRUG SCREEN, HOSP PERFORMED  I-STAT BETA HCG BLOOD, ED ([facility], [facility], [facility] ONLY)    EKG None  Radiology No results found.  Procedures Procedures (including critical care time)  Medications Ordered in ED Medications - No data to display  Initial Impression / Assessment and Plan / ED Course  I have reviewed the triage vital signs and the nursing notes.  Pertinent labs & imaging results that were available during my care of the patient were reviewed by me and considered in my medical decision making (see chart for details).     Patient to ED with depression and SI, sometimes HI when angry. No AVH. History of depression.   Will medically clear and request TTS assessment to determine disposition.  Labs reviewed. UDS absent, however, this will not change her medical clearance. She is considered medically clear for psychiatric evaluation/assessment.  Patient assessed and found to meet inpatient criteria. [facility].   Final Clinical Impressions(s) / ED Diagnoses   Final diagnoses:  None   1. Depression 2. Suicidal ideation  ED Discharge Orders    None       Elpidio AnisUpstill, Jerardo Costabile, PA-C 07/23/18 0305    Elpidio AnisUpstill, Anaria Kroner, PA-C 07/23/18 0403    Mol[place], Jonny RuizJohn, MD 07/23/18 [id number]
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
## 2018-07-23 NOTE — H&P (Addendum)
Psychiatric Admission Assessment Adult  Patient Identification: Emily Frank MRN:  [id number] Date of Evaluation:  07/23/2018 Chief Complaint: Worsening Depression Principal Diagnosis:  MDD, No Psychotic Features  Diagnosis:  Active Problems:   MDD (major depressive disorder), recurrent episode, severe (HCC)  History of Present Illness: Patient is a 28 year old female, who presented to ED voluntarily.  Had spoken with her therapist on the phone and was encouraged to come to ED. Describes worsening depression over the last several weeks. States depression became severe enough to interfere with her work and she needed to take some time off. States " I get these moments when I break down and I cry for no reason". Endorses neuro-vegetative symptoms of depression as below Denies psychotic symptoms. States she has been depressed since her mother passed away a year and half ago. She also lost her apartment recently and is now staying with a friend.   Associated Signs/Symptoms: Depression Symptoms:  depressed mood, anhedonia, insomnia, suicidal thoughts without plan, anxiety, panic attacks, loss of energy/fatigue, decreased appetite, reports she has lost 15 lbs recently (Hypo) Manic Symptoms:  None endorsed or noted  Anxiety Symptoms: reports worsening anxiety and reports frequent panic attacks, triggered by feeling overwhelmed. Psychotic Symptoms:  Denies  PTSD Symptoms: Does not endorse PTSD symptoms. Total Time spent with patient: 45 minutes  Past Psychiatric History:no prior psychiatric admissions, reports history of suicide attempts by overdosing, most recently in December 2019, by overdosing . States she did not seek treatment at the time.Denies history of self cutting or self injurious behaviors. Denies history of mania or hypomania. History of panic attacks, denies agoraphobia. Denies history of psychosis. Patient states she started experiencing depression after her mother  passed away in May 15, 2017.   Is the patient at risk to self? Yes.    Has the patient been a risk to self in the past 6 months? Yes.    Has the patient been a risk to self within the distant past? Yes.    Is the patient a risk to others? No.  Has the patient been a risk to others in the past 6 months? No.  Has the patient been a risk to others within the distant past? No.   Prior Inpatient Therapy:  Denies Prior Outpatient Therapy:  She has an established therapist and a psychiatrist .   Alcohol Screening: 1. How often do you have a drink containing alcohol?: Never 2. How many drinks containing alcohol do you have on a typical day when you are drinking?: 1 or 2 3. How often do you have six or more drinks on one occasion?: Never AUDIT-C Score: 0 4. How often during the last year have you found that you were not able to stop drinking once you had started?: Never 5. How often during the last year have you failed to do what was normally expected from you becasue of drinking?: Never 6. How often during the last year have you needed a first drink in the morning to get yourself going after a heavy drinking session?: Never 7. How often during the last year have you had a feeling of guilt of remorse after drinking?: Never 8. How often during the last year have you been unable to remember what happened the night before because you had been drinking?: Never 9. Have you or someone else been injured as a result of your drinking?: No 10. Has a relative or friend or a doctor or another health worker been concerned about  your drinking or suggested you cut down?: No Alcohol Use Disorder Identification Test Final Score (AUDIT): 0 Substance Abuse History in the last 12 months:  Denies alcohol abuse, denies drug abuse  Consequences of Substance Abuse: Denies  Previous Psychotropic Medications: Klonopin 0.5 mgrs BID, states was taking as prescribed ( taking 1 mgr QHS) . Zoloft , which was recently  increased from 50 to 100 mgrs QHS, but had not yet started taking the higher dose . States she had been on Xanax and Lexapro in the past . Psychological Evaluations: No  Past Medical History: History of PCOS.  NKDA.  Past Medical History:  Diagnosis Date  . Depression   . H/O chlamydia infection   . H/O gonorrhea   . Obesity   . Polycystic ovarian syndrome    History reviewed. No pertinent surgical history. Family History: mother passed away October 20, 2018from complications of a MVA. Father alive, states she has a distant relationship with him.Has one sister. Family History  Problem Relation Age of Onset  . Hypertension Mother   . Diabetes Mother   . Hepatitis C Mother   . Renal Disease Mother   . Hypertension Father   . Hepatitis C Father   . Breast cancer Maternal Grandmother   . Brain cancer Maternal Grandmother   . Bone cancer Maternal Grandmother   . Hypertension Maternal Grandmother   . Renal Disease Maternal Grandmother   . Diabetes Maternal Grandmother   . Colon cancer Maternal Grandfather   . Breast cancer Paternal Grandmother   . Hypertension Paternal Grandmother    Family Psychiatric  History: Maternal Aunt has history of Schizophrenia. No suicides in family. Father has history of alcohol and cocaine abuse . Tobacco Screening:  does not smoke or use tobacco products  Social History: married, separated, going through divorce, no children, currently homeless, has been staying with a friend. Employed. Social History   Substance and Sexual Activity  Alcohol Use Yes   Comment: Occasional      Social History   Substance and Sexual Activity  Drug Use No    Additional Social History:  Allergies:  No Known Allergies Lab Results:  Results for orders placed or performed during the hospital encounter of 07/23/18 (from the past 48 hour(s))  Comprehensive metabolic panel     Status: Abnormal   Collection Time: 07/23/18  2:15 AM  Result Value Ref Range   Sodium 139  135 - 145 mmol/L   Potassium 3.8 3.5 - 5.1 mmol/L   Chloride 106 98 - 111 mmol/L   CO2 25 22 - 32 mmol/L   Glucose, Bld 95 70 - 99 mg/dL   BUN 7 6 - 20 mg/dL   Creatinine, Ser 8.29 0.44 - 1.00 mg/dL   Calcium 8.7 (L) 8.9 - 10.3 mg/dL   Total Protein 7.4 6.5 - 8.1 g/dL   Albumin 3.6 3.5 - 5.0 g/dL   AST 14 (L) 15 - 41 U/L   ALT 10 0 - 44 U/L   Alkaline Phosphatase 52 38 - 126 U/L   Total Bilirubin 0.6 0.3 - 1.2 mg/dL   GFR calc non Af Amer >60 >60 mL/min   GFR calc Af Amer >60 >60 mL/min   Anion gap 8 5 - 15    Comment: Performed at [facility], [address], [place], [place] [address]  Ethanol     Status: None   Collection Time: 07/23/18  2:15 AM  Result Value Ref Range   Alcohol, Ethyl (  B) <10 <10 mg/dL    Comment: (NOTE) Lowest detectable limit for serum alcohol is 10 mg/dL. For medical purposes only. Performed at [facility], [address] [place], [place] [address]   Salicylate level     Status: None   Collection Time: 07/23/18  2:15 AM  Result Value Ref Range   Salicylate Lvl <7.0 2.8 - 30.0 mg/dL    Comment: Performed at [facility], [address] [place], [place] [address]  Acetaminophen level     Status: Abnormal   Collection Time: 07/23/18  2:15 AM  Result Value Ref Range   Acetaminophen (Tylenol), Serum <10 (L) 10 - 30 ug/mL    Comment: (NOTE) Therapeutic concentrations vary significantly. A range of 10-30 ug/mL  may be an effective concentration for many patients. However, some  are best treated at concentrations outside of this range. Acetaminophen concentrations >150 ug/mL at 4 hours after ingestion  and >50 ug/mL at 12 hours after ingestion are often associated with  toxic reactions. Performed at [facility], [address] [place], [place] [address]   cbc     Status: Abnormal   Collection Time: 07/23/18  2:15 AM  Result Value Ref Range   WBC 10.6 (H) 4.0 - 10.5 K/uL    RBC 4.60 3.87 - 5.11 MIL/uL   Hemoglobin 11.8 (L) 12.0 - 15.0 g/dL   HCT 91.439.5 78.236.0 - 95.646.0 %   MCV 85.9 80.0 - 100.0 fL   MCH 25.7 (L) 26.0 - 34.0 pg   MCHC 29.9 (L) 30.0 - 36.0 g/dL   RDW 21.315.9 (H) 08.611.5 - 57.815.5 %   Platelets 295 150 - 400 K/uL   nRBC 0.0 0.0 - 0.2 %    Comment: Performed at [facility], [address] [place], [place] [address]  I-Stat beta hCG blood, ED     Status: None   Collection Time: 07/23/18  2:20 AM  Result Value Ref Range   I-stat hCG, quantitative <5.0 <5 mIU/mL   Comment 3            Comment:   GEST. AGE      CONC.  (mIU/mL)   <=1 WEEK        5 - 50     2 WEEKS       50 - 500     3 WEEKS       100 - 10,000     4 WEEKS     1,000 - 30,000        FEMALE AND NON-PREGNANT FEMALE:     LESS THAN 5 mIU/mL   Rapid urine drug screen (hospital performed)     Status: None   Collection Time: 07/23/18  3:42 AM  Result Value Ref Range   Opiates NONE DETECTED NONE DETECTED   Cocaine NONE DETECTED NONE DETECTED   Benzodiazepines NONE DETECTED NONE DETECTED   Amphetamines NONE DETECTED NONE DETECTED   Tetrahydrocannabinol NONE DETECTED NONE DETECTED   Barbiturates NONE DETECTED NONE DETECTED    Comment: (NOTE) DRUG SCREEN FOR MEDICAL PURPOSES ONLY.  IF CONFIRMATION IS NEEDED FOR ANY PURPOSE, NOTIFY LAB WITHIN 5 DAYS. LOWEST DETECTABLE LIMITS FOR URINE DRUG SCREEN Drug Class                     Cutoff (ng/mL) Amphetamine and metabolites    1000 Barbiturate and metabolites    200 Benzodiazepine  200 Tricyclics and metabolites     300 Opiates and metabolites        300 Cocaine and metabolites        300 THC                            50 Performed at [facility], [address] [place], [place] [address]     Blood Alcohol level:  Lab Results  Component Value Date   ETH <10 07/23/2018    Metabolic Disorder Labs:  Lab Results  Component Value Date   HGBA1C 5.1 02/23/2018   Lab Results   Component Value Date   PROLACTIN 14.9 04/29/2017   Lab Results  Component Value Date   CHOL 165 05/05/2017   TRIG 78 05/05/2017   HDL 42 05/05/2017   CHOLHDL 3.9 05/05/2017   LDLCALC 107 (H) 05/05/2017    Frank Medications: Frank Facility-Administered Medications  Medication Dose Route Frequency Provider Last Rate Last Dose  . acetaminophen (TYLENOL) tablet 650 mg  650 mg Oral Q6H PRN Kerry Hough, PA-C      . alum & mag hydroxide-simeth (MAALOX/MYLANTA) 200-200-20 MG/5ML suspension 30 mL  30 mL Oral Q4H PRN Donell Sievert E, PA-C      . hydrOXYzine (ATARAX/VISTARIL) tablet 50 mg  50 mg Oral Q6H PRN Donell Sievert E, PA-C      . magnesium hydroxide (MILK OF MAGNESIA) suspension 30 mL  30 mL Oral Daily PRN Kerry Hough, PA-C      . traZODone (DESYREL) tablet 100 mg  100 mg Oral QHS,MR X 1 Simon, Spencer E, PA-C       PTA Medications: Medications Prior to Admission  Medication Sig Dispense Refill Last Dose  . CLONAZEPAM PO Take 0.5 mg by mouth 2 (two) times daily.    Past Month at Unknown time  . Dulaglutide (TRULICITY) 1.5 MG/0.5ML SOPN Inject 1.5 mg into the skin [place] weekly. (Patient taking differently: Inject 1.5 mg into the skin [place] weekly. mondays) 4 pen 2 Past Month at Unknown time  . metFORMIN (GLUCOPHAGE) 1000 MG tablet Take 1 tablet (1,000 mg total) by mouth 2 (two) times daily with a meal. 180 tablet 3 Past Month at Unknown time    Musculoskeletal: Strength & Muscle Tone: within normal limits Gait & Station: normal Patient leans: N/A  Psychiatric Specialty Exam: Physical Exam  Review of Systems  Constitutional: Negative.   HENT: Negative.   Eyes: Negative.   Respiratory: Negative.   Cardiovascular: Negative.   Gastrointestinal: Negative for blood in stool, diarrhea, nausea and vomiting.  Genitourinary: Negative.   Musculoskeletal: Negative.   Skin: Negative.   Neurological: Positive for headaches. Negative for seizures.  Endo/Heme/Allergies:  Negative.   Psychiatric/Behavioral: Positive for depression and suicidal ideas. The patient is nervous/anxious.   All other systems reviewed and are negative.   Blood pressure (!) 149/81, pulse 94, temperature 98.9 F (37.2 C), temperature source Oral, resp. rate 18, height 5' 4.57" (1.64 m), weight (!) 142 kg, SpO2 100 %.Body mass index is 52.79 kg/m.  General Appearance: Well Groomed  Eye Contact:  Fair  Speech:  Normal Rate  Volume:  Decreased  Mood:  Depressed  Affect:  Constricted and Tearful  Thought Process:  Linear and Descriptions of Associations: Intact  Orientation:  Other:  fully alert and attentive  Thought Content:  no hallucinations, no delusions, not internally preoccupied  Suicidal Thoughts:  No denies any suicidal or  self injurious ideations at this time and contracts for safety, no homicidal or violent ideations  Homicidal Thoughts:  No  Memory:  recent and remote grossly intact   Judgement:  Fair  Insight:  Fair  Psychomotor Activity:  Normal  Concentration:  Concentration: Good and Attention Span: Good  Recall:  Good  Fund of Knowledge:  Good  Language:  Good  Akathisia:  Negative  Handed:  Right  AIMS (if indicated):     Assets:  Communication Skills Desire for Improvement Resilience  ADL's:  Intact  Cognition:  WNL  Sleep:       Treatment Plan Summary: Daily contact with patient to assess and evaluate symptoms and progress in treatment, Medication management, Plan as tolerated  and medications as below  Observation Level/Precautions:  15 minute checks  Laboratory:  as needed  TSH  Psychotherapy:  Milieu, group therapy  Medications:  We discussed options, has been on Zoloft x 1 month at 50 mgrs QDAY, no side effects. Patient states Klonopin does not seem to help her insomnia or anxiety, and prefers to taper off . Add Abilify 2 mgr QDAY for antidepressant augmentation.  Medication side effects discussed  Zoloft 100 mgrs QHS Klonopin 0.5 mgrs BID PRN  for anxiety or insomnia ( taper off gradually)  Abilify 2 mgr QDAY for antidepressant augmentation She also reports she has been on Metformin 1000 mgrs BID x 2 years without side effects, for management of PCOS.  Consultations:  As needed   Discharge Concerns:  -  Estimated LOS: 5 days   Other:     Physician Treatment Plan for Primary Diagnosis:  MDD, no psychotic features Long Term Goal(s): Improvement in symptoms so as ready for discharge  Short Term Goals: Ability to identify changes in lifestyle to reduce recurrence of condition will improve and Ability to maintain clinical measurements within normal limits will improve  Physician Treatment Plan for Secondary Diagnosis: Active Problems:   MDD (major depressive disorder), recurrent episode, severe (HCC)  Long Term Goal(s): Improvement in symptoms so as ready for discharge  Short Term Goals: Ability to identify changes in lifestyle to reduce recurrence of condition will improve, Ability to verbalize feelings will improve, Ability to disclose and discuss suicidal ideas, Ability to demonstrate self-control will improve, Ability to identify and develop effective coping behaviors will improve and Ability to maintain clinical measurements within normal limits will improve  I certify that inpatient services furnished can reasonably be expected to improve the patient's condition.    Craige CottaFernando A , MD 1/10/20202:30 PM

## 2018-07-23 NOTE — ED Notes (Signed)
3 bags of pt belongings given to [facility] driver for transport to [facility]. Pt condition stable and traveling to [facility]. [facility] called for transport

## 2018-07-23 NOTE — Progress Notes (Signed)
Adult Psychoeducational Group Note  Date:  07/23/2018 Time:  9:07 PM  Group Topic/Focus:  Wrap-Up Group:   The focus of this group is to help patients review their daily goal of treatment and discuss progress on daily workbooks.  Participation Level:  Active  Participation Quality:  Appropriate  Affect:  Appropriate  Cognitive:  Alert  Insight: Appropriate  Engagement in Group:  Engaged  Modes of Intervention:  Discussion  Additional Comments:  Patient stated having an okay day. Patient's goal for today was to be less depressed.   Emani Morad L Lonie Rummell 07/23/2018, 9:07 PM

## 2018-07-23 NOTE — Progress Notes (Signed)
Admission Note:  Patient is a 28 year old female who is admitted for depression and SI thoughts with a plan to overdose. Patient voices SI thoughts on admission; however she verbally contracts for safety. Patient stressors are relationship problems, death of mom in 05/29/2017, lack of support at home. Patient is alert oriented and ambulatory. Patient currently works a job. Patient denies any substance abuse. Patient was informed of unit policies and she sign consents. Patient skin assessment and search was completed and skin was intact and free of abnormal marks. There was no contraband found in her belongings. Patient escorted to unit by staff and oriented to unit and her room. Patient without questions at this time. Food and fluids but declined.

## 2018-07-23 NOTE — Progress Notes (Signed)
D: Pt was in bed in her room upon initial approach.  Pt presents with anxious, depressed affect and mood.  She describes her day as "difficult."  Her goal is to "sleep through the whole night and be safe."  Pt denies SI/HI, denies hallucinations, denies pain.  Pt was visible in milieu intermittently tonight.  She attended evening group.  A: Introduced self to pt.  Met with pt 1:1.  Actively listened to pt and offered support and encouragement.  PRN medication administered for anxiety and sleep.  Q15 minute safety checks maintained.  R: Pt is safe on the unit.  Pt is compliant with medications.  Pt verbally contracts for safety.  Will continue to monitor and assess.
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
## 2018-07-23 NOTE — Progress Notes (Signed)
Recreation Therapy Notes  Date: 1.10.20 Time: 0930 Location: [address] Dayroom  Group Topic: Stress Management  Goal Area(s) Addresses:  Patient will identify ways to cope with stress.  Patient will identify stress management techniques. Patient will identify benefits of using stress management post d/c.  Intervention: Stress Management  Activity :  Meditation.  LRT introduced the stress management technique of meditation.  LRT played a meditation that allowed patients to focus on resiliency. Patients were to follow along as meditation played in order to engage in activity.  Education:  Stress Management, Discharge Planning.   Education Outcome: Acknowledges Education  Clinical Observations/Feedback: Pt did not attend group.    Caroll RancherMarjette Carlyann Placide, LRT/CTRS         Caroll RancherLindsay, Ludella Pranger A 07/23/2018 11:46 AM

## 2018-07-24 MED ORDER — CHOLECALCIFEROL 10 MCG (400 UNIT) PO TABS
400.0000 [IU] | ORAL_TABLET | Freq: Every day | ORAL | Status: DC
  Administered 2018-07-24 – 2018-07-27 (×4): 400 [IU] via ORAL
  Filled 2018-07-24 (×6): qty 1

## 2018-07-24 MED ORDER — SERTRALINE HCL 100 MG PO TABS
100.0000 mg | ORAL_TABLET | Freq: Every day | ORAL | Status: DC
  Administered 2018-07-25 – 2018-07-26 (×2): 100 mg via ORAL
  Filled 2018-07-24 (×4): qty 1

## 2018-07-24 MED ORDER — TRAZODONE HCL 50 MG PO TABS
50.0000 mg | ORAL_TABLET | Freq: Every evening | ORAL | Status: DC | PRN
  Administered 2018-07-24: 50 mg via ORAL
  Filled 2018-07-24: qty 1

## 2018-07-24 NOTE — Progress Notes (Addendum)
[facility] MD Progress Note  07/24/2018 1:49 PM Emily Frank  MRN:  [id number] Subjective: Patient reports some improvement although continues to feel depressed.  States "I will be smiling one minute but then the next I still feel like crying".  Denies medication side effects other than some mild sedation on Zoloft.  Of note, has endorsed diarrhea but does not feel that this is related to Zoloft.  Rather, states that diarrhea occurs when she has stopped and restarted metformin in the past, and attributes to this.  States that in the circumstances diarrhea is temporary and resolves quickly.  Today denies suicidal ideations. Objective : I have reviewed chart notes and have met with patient. 28 year old female, separated, no children, currently living with a friend, employed. Presented to hospital voluntarily reporting worsening depression, suicidal ideations ( passive) , significant neuro-vegetative symptoms of depression. Reports history of depression over the last year and half after her mother passed away.  Today reports some persistent depression although acknowledges partial improvement compared to how she felt prior to admission.  She presents with a constricted but more reactive affect today and is not currently tearful.  She also smiles at times during session.  She denies suicidal ideations at this time and contracts for safety.  Has been visible in milieu, interacting with peers, going to some groups, no disruptive or agitated behaviors on unit.  Reports mild sedation related to Zoloft.  States she has not felt the need to take Klonopin as needed since admission.  Does not present with nor endorse symptoms of benzodiazepine withdrawal and vitals are currently stable.   Principal Problem:  MDD Diagnosis: Active Problems:   MDD (major depressive disorder), recurrent episode, severe ([place])  Total Time spent with patient: 20 minutes  Past Psychiatric History:   Past Medical History:  Past  Medical History:  Diagnosis Date  . Depression   . H/O chlamydia infection   . H/O gonorrhea   . Obesity   . Polycystic ovarian syndrome    History reviewed. No pertinent surgical history. Family History:  Family History  Problem Relation Age of Onset  . Hypertension Mother   . Diabetes Mother   . Hepatitis C Mother   . Renal Disease Mother   . Hypertension Father   . Hepatitis C Father   . Breast cancer Maternal Grandmother   . Brain cancer Maternal Grandmother   . Bone cancer Maternal Grandmother   . Hypertension Maternal Grandmother   . Renal Disease Maternal Grandmother   . Diabetes Maternal Grandmother   . Colon cancer Maternal Grandfather   . Breast cancer Paternal Grandmother   . Hypertension Paternal Grandmother    Family Psychiatric  History:  Social History:  Social History   Substance and Sexual Activity  Alcohol Use Yes   Comment: Occasional      Social History   Substance and Sexual Activity  Drug Use No    Social History   Socioeconomic History  . Marital status: Legally Separated    Spouse name: Not on file  . Number of children: Not on file  . Years of education: Not on file  . Highest education level: Not on file  Occupational History    Employer: [employer]  . Financial resource strain: Not on file  . Food insecurity:    Worry: Not on file    Inability: Not on file  . Transportation needs:    Medical: Not on file  Non-medical: Not on file  Tobacco Use  . Smoking status: Never Smoker  . Smokeless tobacco: Never Used  Substance and Sexual Activity  . Alcohol use: Yes    Comment: Occasional   . Drug use: No  . Sexual activity: Not Currently    Partners: Male    Birth control/protection: None  Lifestyle  . Physical activity:    Days per week: Not on file    Minutes per session: Not on file  . Stress: Not on file  Relationships  . Social connections:    Talks on phone: Not on file     Gets together: Not on file    Attends religious service: Not on file    Active member of club or organization: Not on file    Attends meetings of clubs or organizations: Not on file    Relationship status: Not on file  Other Topics Concern  . Not on file  Social History Narrative  . Not on file   Additional Social History:   Sleep: Good  Appetite:  Good  Current Medications: Current Facility-Administered Medications  Medication Dose Route Frequency Provider Last Rate Last Dose  . acetaminophen (TYLENOL) tablet 650 mg  650 mg Oral Q6H PRN Laverle Hobby, PA-C      . alum & mag hydroxide-simeth (MAALOX/MYLANTA) 200-200-20 MG/5ML suspension 30 mL  30 mL Oral Q4H PRN Patriciaann Clan E, PA-C   30 mL at 07/24/18 1215  . ARIPiprazole (ABILIFY) tablet 2 mg  2 mg Oral Daily , Myer Peer, MD   2 mg at 07/24/18 0833  . cholecalciferol (VITAMIN D3) tablet 400 Units  400 Units Oral Daily , Myer Peer, MD      . clonazePAM (KLONOPIN) tablet 0.5 mg  0.5 mg Oral BID PRN , Myer Peer, MD      . hydrOXYzine (ATARAX/VISTARIL) tablet 25 mg  25 mg Oral Q6H PRN , Myer Peer, MD   25 mg at 07/23/18 2029  . magnesium hydroxide (MILK OF MAGNESIA) suspension 30 mL  30 mL Oral Daily PRN Patriciaann Clan E, PA-C      . metFORMIN (GLUCOPHAGE) tablet 1,000 mg  1,000 mg Oral BID WC , Myer Peer, MD   1,000 mg at 07/23/18 1715  . [START ON 07/25/2018] sertraline (ZOLOFT) tablet 100 mg  100 mg Oral QHS , Myer Peer, MD      . traZODone (DESYREL) tablet 50 mg  50 mg Oral QHS PRN,MR X 1 Lindon Romp A, NP   50 mg at 07/23/18 2105    Lab Results:  Results for orders placed or performed during the hospital encounter of 07/23/18 (from the past 48 hour(s))  Comprehensive metabolic panel     Status: Abnormal   Collection Time: 07/23/18  2:15 AM  Result Value Ref Range   Sodium 139 135 - 145 mmol/L   Potassium 3.8 3.5 - 5.1 mmol/L   Chloride 106 98 - 111 mmol/L   CO2 25 22 - 32 mmol/L    Glucose, Bld 95 70 - 99 mg/dL   BUN 7 6 - 20 mg/dL   Creatinine, Ser 0.76 0.44 - 1.00 mg/dL   Calcium 8.7 (L) 8.9 - 10.3 mg/dL   Total Protein 7.4 6.5 - 8.1 g/dL   Albumin 3.6 3.5 - 5.0 g/dL   AST 14 (L) 15 - 41 U/L   ALT 10 0 - 44 U/L   Alkaline Phosphatase 52 38 - 126 U/L   Total Bilirubin 0.6 0.3 - 1.2  mg/dL   GFR calc non Af Amer >60 >60 mL/min   GFR calc Af Amer >60 >60 mL/min   Anion gap 8 5 - 15    Comment: Performed at [facility], [place] [address], [place], [place] [address]  Ethanol     Status: None   Collection Time: 07/23/18  2:15 AM  Result Value Ref Range   Alcohol, Ethyl (B) <10 <10 mg/dL    Comment: (NOTE) Lowest detectable limit for serum alcohol is 10 mg/dL. For medical purposes only. Performed at [facility], [place] [address] [place], [place] [address]   Salicylate level     Status: None   Collection Time: 07/23/18  2:15 AM  Result Value Ref Range   Salicylate Lvl <9.8 2.8 - 30.0 mg/dL    Comment: Performed at [facility], [place] [address] [place], [place] [address]  Acetaminophen level     Status: Abnormal   Collection Time: 07/23/18  2:15 AM  Result Value Ref Range   Acetaminophen (Tylenol), Serum <10 (L) 10 - 30 ug/mL    Comment: (NOTE) Therapeutic concentrations vary significantly. A range of 10-30 ug/mL  may be an effective concentration for many patients. However, some  are best treated at concentrations outside of this range. Acetaminophen concentrations >150 ug/mL at 4 hours after ingestion  and >50 ug/mL at 12 hours after ingestion are often associated with  toxic reactions. Performed at [facility], [place] [address] [place], [place] [address]   cbc     Status: Abnormal   Collection Time: 07/23/18  2:15 AM  Result Value Ref Range   WBC 10.6 (H) 4.0 - 10.5 K/uL   RBC 4.60 3.87 - 5.11 MIL/uL   Hemoglobin 11.8 (L) 12.0 - 15.0 g/dL   HCT 39.5 36.0 - 46.0 %   MCV 85.9  80.0 - 100.0 fL   MCH 25.7 (L) 26.0 - 34.0 pg   MCHC 29.9 (L) 30.0 - 36.0 g/dL   RDW 15.9 (H) 11.5 - 15.5 %   Platelets 295 150 - 400 K/uL   nRBC 0.0 0.0 - 0.2 %    Comment: Performed at [facility], [place] [address], [place], [place] [address]  I-Stat beta hCG blood, ED     Status: None   Collection Time: 07/23/18  2:20 AM  Result Value Ref Range   I-stat hCG, quantitative <5.0 <5 mIU/mL   Comment 3            Comment:   GEST. AGE      CONC.  (mIU/mL)   <=1 WEEK        5 - 50     2 WEEKS       50 - 500     3 WEEKS       100 - 10,000     4 WEEKS     1,000 - 30,000        FEMALE AND NON-PREGNANT FEMALE:     LESS THAN 5 mIU/mL   Rapid urine drug screen (hospital performed)     Status: None   Collection Time: 07/23/18  3:42 AM  Result Value Ref Range   Opiates NONE DETECTED NONE DETECTED   Cocaine NONE DETECTED NONE DETECTED   Benzodiazepines NONE DETECTED NONE DETECTED   Amphetamines NONE DETECTED NONE DETECTED   Tetrahydrocannabinol NONE DETECTED NONE DETECTED   Barbiturates NONE DETECTED NONE DETECTED    Comment: (NOTE) [facility]  PURPOSES ONLY.  IF CONFIRMATION IS NEEDED FOR ANY PURPOSE, NOTIFY LAB WITHIN 5 DAYS. LOWEST DETECTABLE LIMITS FOR URINE DRUG SCREEN Drug Class                     Cutoff (ng/mL) Amphetamine and metabolites    1000 Barbiturate and metabolites    200 Benzodiazepine                 161 Tricyclics and metabolites     300 Opiates and metabolites        300 Cocaine and metabolites        300 THC                            50 Performed at [facility], [place] [address], [place], [place] [address]     Blood Alcohol level:  Lab Results  Component Value Date   ETH <10 54/03/8118    Metabolic Disorder Labs: Lab Results  Component Value Date   HGBA1C 5.1 02/23/2018   Lab Results  Component Value Date   PROLACTIN 14.9 04/29/2017   Lab Results  Component Value Date   CHOL 165 05/05/2017    TRIG 78 05/05/2017   HDL 42 05/05/2017   CHOLHDL 3.9 05/05/2017   LDLCALC 107 (H) 05/05/2017    Physical Findings: AIMS: Facial and Oral Movements Muscles of Facial Expression: None, normal Lips and Perioral Area: None, normal Jaw: None, normal Tongue: None, normal,Extremity Movements Upper (arms, wrists, hands, fingers): None, normal Lower (legs, knees, ankles, toes): None, normal, Trunk Movements Neck, shoulders, hips: None, normal, Overall Severity Severity of abnormal movements (highest score from questions above): None, normal Incapacitation due to abnormal movements: None, normal Patient's awareness of abnormal movements (rate only patient's report): No Awareness, Dental Status Current problems with teeth and/or dentures?: No Does patient usually wear dentures?: No  CIWA:  CIWA-Ar Total: 0 COWS:  COWS Total Score: 0  Musculoskeletal: Strength & Muscle Tone: within normal limits Gait & Station: normal Patient leans: N/A  Psychiatric Specialty Exam: Physical Exam  ROS currently denies headache, no chest pain, no shortness of breath, no vomiting, describes loose stools, without associated symptoms.  As above attributes to Metformin rather than to Zoloft. No fever, no chills  Blood pressure 116/69, pulse 100, temperature 98.7 F (37.1 C), temperature source Oral, resp. rate 16, height 5' 4.57" (1.64 m), weight (!) 142 kg, SpO2 100 %.Body mass index is 52.79 kg/m.  General Appearance: Improved grooming  Eye Contact:  Good  Speech:  Normal Rate  Volume:  Normal  Mood:  Remains depressed, acknowledges some improvement compared to admission  Affect:  Becoming more reactive, not tearful today, smiles briefly at times  Thought Process:  Linear and Descriptions of Associations: Intact  Orientation:  Full (Time, Place, and Person)  Thought Content:  No hallucinations, no delusions  Suicidal Thoughts:  No currently denies suicidal or self-injurious ideations, denies homicidal or  violent ideations, contracts for safety on unit  Homicidal Thoughts:  No  Memory:  Recent and remote grossly intact  Judgement:  Other:  Improving  Insight:  Improving  Psychomotor Activity:  Normal-visible on unit/dayroom  Concentration:  Concentration: Good and Attention Span: Good  Recall:  Good  Fund of Knowledge:  Good  Language:  Good  Akathisia:  Negative  Handed:  Right  AIMS (if indicated):     Assets:  Communication Skills Desire for Improvement Resilience  ADL's:  Intact  Cognition:  WNL  Sleep:  Number of Hours: 6.25   Assessment: 28 year old female, separated, no children, currently living with a friend, employed. Presented to hospital voluntarily reporting worsening depression, suicidal ideations ( passive) , significant neuro-vegetative symptoms of depression. Reports history of depression over the last year and half after her mother passed away.   Patient is presenting with partially improved mood although reports persistent depression.  Affect less constricted, becoming more reactive.  Denies suicidal ideations today.  Tolerating medications well although reports mild sedation on Zoloft.  Currently presents fully alert and attentive.  Reports recent onset of diarrhea but attributes to Metformin (recently restarted for PCOS) rather than to Zoloft.     Treatment Plan Summary: Daily contact with patient to assess and evaluate symptoms and progress in treatment, Medication management, Plan Inpatient treatment and Medications as below Encourage group and milieu participation to work on coping skills and symptom reduction Treatment team working on disposition planning options Continue Klonopin at 0.5 mg twice daily PRN for anxiety or insomnia Continue Trazodone 50 mg nightly PRN for insomnia Continue Abilify 2 mg daily for antidepressant augmentation/mood disorder Change Zoloft to 100 mg nightly for depression, anxiety (nightly dosing to minimize sedation) Continue  Glucophage for history of PCOS Resume vitamin D supplementation for history of hypo-vitaminosis D.  Patient states she had been taking vitamin D supplement irregularly prior to admission. Jenne Campus, MD 07/24/2018, 1:49 PM

## 2018-07-24 NOTE — Plan of Care (Signed)
D: Patient presents depressed. She complains of continued diarrhea and nausea from possible GI infection. She reports her symptoms have greatly improved, but chose to hold off on the morning dose of metformin. She reports poor sleep, despite receiving trazodone last night. She continues to decline clonazepam, as she is afraid of its addictive properties. Her appetite is fair, energy low and concentration good. She rates her depression, hopelessness and anxiety 10/10. She denies withdrawal symptoms or physical complaints. Patient denies SI/HI/AVH.  A: Patient checked q15 min, and checks reviewed. Reviewed medication changes with patient and educated on side effects. Educated patient on importance of attending group therapy sessions and educated on several coping skills. Encouarged participation in milieu through recreation therapy and attending meals with peers. Support and encouragement provided. Fluids offered. R: Patient receptive to education on medications, and is medication compliant. Patient contracts for safety on the unit. Her goal today is "being less depressed" and "sit up and talk to people."   Problem: Activity: Goal: Interest or engagement in leisure activities will improve Outcome: Not Progressing Goal: Imbalance in normal sleep/wake cycle will improve Outcome: Not Progressing   Problem: Self-Concept: Goal: Will verbalize positive feelings about self Outcome: Not Progressing Goal: Level of anxiety will decrease Outcome: Not Progressing

## 2018-07-24 NOTE — [facility] Group Notes (Signed)
LCSW Group Therapy Note  07/24/2018   10:00-11:00am   Type of Therapy and Topic:  Group Therapy: Anger Cues and Responses  Participation Level:  Active   Description of Group:   In this group, patients learned how to recognize the physical, cognitive, emotional, and behavioral responses they have to anger-provoking situations.  They identified a recent time they became angry and how they reacted.  They analyzed how their reaction was possibly beneficial and how it was possibly unhelpful.  The group discussed a variety of healthier coping skills that could help with such a situation in the future.  Deep breathing was practiced briefly.  Therapeutic Goals: 1. Patients will remember their last incident of anger and how they felt emotionally and physically, what their thoughts were at the time, and how they behaved. 2. Patients will identify how their behavior at that time worked for them, as well as how it worked against them. 3. Patients will explore possible new behaviors to use in future anger situations. 4. Patients will learn that anger itself is normal and cannot be eliminated, and that healthier reactions can assist with resolving conflict rather than worsening situations.  Summary of Patient Progress:  The patient shared that her most recent time of anger was just prior to hospital admission and said she turned off her telephone but people kept calling her and texting her anyway.  Initially she said this was because they should know she did not want to talk to them, but later she expressed her feelings thoroughly in such a way that a lot of people in the room identified with her and validated her feelings.  She said when she gets very angry she will hyperventilate and have panic attacks, and this is what happened prior to hospitalization..  Therapeutic Modalities:   Cognitive Behavioral Therapy  Lynnell Chad

## 2018-07-24 NOTE — Progress Notes (Signed)
D: Pt was in dayroom upon initial approach.  Pt presents with appropriate affect and mood.  She has been smiling and laughing frequently tonight.  She reports that today she "went to therapy, went to the gym, went to more therapy."  Her goal tonight is "to sleep."  Pt denies SI/HI, denies hallucinations, denies pain.  Pt has been visible in milieu interacting with peers and staff appropriately.  Pt attended evening group.    A: Met with pt 1:1.  Actively listened to pt and offered support and encouragement.   PRN medication administered for sleep.  Q15 minute safety checks maintained.  R: Pt is safe on the unit.  Pt is compliant with medication.  Pt verbally contracts for safety.  Will continue to monitor and assess.

## 2018-07-24 NOTE — [facility] Group Notes (Signed)
Adult Psychoeducational Group Note  Date:  07/24/2018 Time:  9:02 PM  Group Topic/Focus:  Wrap-Up Group:   The focus of this group is to help patients review their daily goal of treatment and discuss progress on daily workbooks.  Participation Level:  Active  Participation Quality:  Appropriate  Affect:  Appropriate  Cognitive:  Appropriate  Insight: Good  Engagement in Group:  Engaged  Modes of Intervention:  Discussion  Additional Comments:  Pt rated her day an 8 because she stated that she had a very good day and that she met her goal not to be depressed  Emily Frank A 07/24/2018, 9:02 PM
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
## 2018-07-25 MED ORDER — TRAZODONE HCL 50 MG PO TABS
25.0000 mg | ORAL_TABLET | Freq: Every evening | ORAL | Status: DC | PRN
  Administered 2018-07-25: 25 mg via ORAL
  Filled 2018-07-25: qty 1

## 2018-07-25 MED ORDER — ARIPIPRAZOLE 2 MG PO TABS
2.0000 mg | ORAL_TABLET | Freq: Every day | ORAL | Status: DC
  Administered 2018-07-26: 2 mg via ORAL
  Filled 2018-07-25 (×3): qty 1

## 2018-07-25 NOTE — Plan of Care (Signed)
D: Pt presents with a flat affect and a depressed mood. Pt rated on her self inventory sheet: depression 6/10, anxiety 0 and hopelessness 6/10. Pt denies SI/HI. Pt expressed feeling like the world would be better off without her. Pt verbally contracts for safety and denies any self harm thoughts. Pt reported difficulty sleeping last night. Pt requesting to have sleep med adjusted.  A: Medications reviewed with pt. Medications administered as ordered per MD. Verbal support provided. Pt encouraged to attend groups. 15 minute checks performed for safety.   R: Pt compliant with tx plan. Pt stated goal "attend groups".    Care-Plan Problem: Health Behavior/Discharge Planning: Goal: Compliance with therapeutic regimen will improve Outcome: Progressing   Problem: Self-Concept: Goal: Level of anxiety will decrease Outcome: Progressing

## 2018-07-25 NOTE — [facility] Group Notes (Signed)
[facility] LCSW Group Therapy Note  07/25/2018   10:00-11:00AM  Type of Therapy and Topic:  Group Therapy:  Unhealthy versus Healthy Supports, Which Am I?  Participation Level:  Active   Description of Group:  Patients in this group were introduced to the concept that additional supports including self-support are an essential part of recovery.  Initially a discussion was held about the differences between healthy versus unhealthy supports.  Patients were asked to share what unhealthy supports in their lives need to be addressed, as well as what additional healthy supports could be added for greater help in reaching their goals.   A song entitled "My Own Hero" was played and a group discussion ensued in which patients stated they could relate to the song and it inspired them to realize they have be willing to help themselves in order to succeed, because other people cannot achieve sobriety or stability for them.  We discussed adding a variety of healthy supports to address the various needs in patient lives, including becoming more self-supportive.  Therapeutic Goals: 1)  Highlight the differences between healthy and unhealthy supports 2)  Suggest the importance of being a part of one's own support system 2)  Discuss reasons people in one's life may eventually be unable to be continually supportive  3)  Identify the patient's current support system and   4)  elicit commitments to add healthy supports and to become more conscious of being self-supportive   Summary of Patient Progress:  The patient expressed that the unhealthy support which needs to be addressed includes 2 friends who have poor boundaries and for whom she does things but they will not reciprocate.  Healthy supports which could be added for increased stability and happiness include "I don't know."  She agreed fully with the song that played and said she is a "horrible" support for herself.  But when it was suggested that one of the things she  needs to add is self-support, she would not agree. The group talked at length about how she could go about doing this, even though it will be difficult.  She was reluctant to even consider that it would be possible for her to change the way she has always sacrificed herself to care for others.  Therapeutic Modalities:   Motivational Interviewing Activity  Lynnell Chad

## 2018-07-25 NOTE — Progress Notes (Addendum)
[facility] MD Progress Note  07/25/2018 10:48 AM Emily Frank  MRN:  [id number] Subjective: Patient reports some improvement compared to admission but describes persistent depression and today she feels dejected as she did not have any visitors yesterday.  States that family members had told her they would visit but they did not come.  Denies suicidal ideations.  Does not endorse medication side effects.  Today does not report diarrhea.   Objective : I have reviewed chart notes and have met with patient. 28 year old female, separated, no children, currently living with a friend, employed. Presented to hospital voluntarily reporting worsening depression, suicidal ideations ( passive) , significant neuro-vegetative symptoms of depression. Reports history of depression over the last year and half after her mother passed away.  Patient reports some improvement compared to admission.  She does however continue to present with a vaguely constricted and subdued affect.  Ruminates about issues such as not having visitors yesterday evening.  Denies, however, any suicidal ideations and contracts for safety on unit at this time.  Affect does improve partially during session and smiles at times appropriately.  Thus far she is tolerating current medication regimen well.  She is on Abilify and Zoloft.  She states however that she prefers for medications to be given at at bedtime rather than that a.m. due to subjective feeling of daytime/morning sedation on meds.   Principal Problem:  MDD Diagnosis: Active Problems:   MDD (major depressive disorder), recurrent episode, severe ([place])  Total Time spent with patient: 20 minutes  Past Psychiatric History:   Past Medical History:  Past Medical History:  Diagnosis Date  . Depression   . H/O chlamydia infection   . H/O gonorrhea   . Obesity   . Polycystic ovarian syndrome    History reviewed. No pertinent surgical history. Family History:  Family History   Problem Relation Age of Onset  . Hypertension Mother   . Diabetes Mother   . Hepatitis C Mother   . Renal Disease Mother   . Hypertension Father   . Hepatitis C Father   . Breast cancer Maternal Grandmother   . Brain cancer Maternal Grandmother   . Bone cancer Maternal Grandmother   . Hypertension Maternal Grandmother   . Renal Disease Maternal Grandmother   . Diabetes Maternal Grandmother   . Colon cancer Maternal Grandfather   . Breast cancer Paternal Grandmother   . Hypertension Paternal Grandmother    Family Psychiatric  History:  Social History:  Social History   Substance and Sexual Activity  Alcohol Use Yes   Comment: Occasional      Social History   Substance and Sexual Activity  Drug Use No    Social History   Socioeconomic History  . Marital status: Legally Separated    Spouse name: Not on file  . Number of children: Not on file  . Years of education: Not on file  . Highest education level: Not on file  Occupational History    Employer: [employer]  . Financial resource strain: Not on file  . Food insecurity:    Worry: Not on file    Inability: Not on file  . Transportation needs:    Medical: Not on file    Non-medical: Not on file  Tobacco Use  . Smoking status: Never Smoker  . Smokeless tobacco: Never Used  Substance and Sexual Activity  . Alcohol use: Yes    Comment: Occasional   .  Drug use: No  . Sexual activity: Not Currently    Partners: Male    Birth control/protection: None  Lifestyle  . Physical activity:    Days per week: Not on file    Minutes per session: Not on file  . Stress: Not on file  Relationships  . Social connections:    Talks on phone: Not on file    Gets together: Not on file    Attends religious service: Not on file    Active member of club or organization: Not on file    Attends meetings of clubs or organizations: Not on file    Relationship status: Not on file  Other  Topics Concern  . Not on file  Social History Narrative  . Not on file   Additional Social History:   Sleep: Good  Appetite:  Good  Current Medications: Current Facility-Administered Medications  Medication Dose Route Frequency Provider Last Rate Last Dose  . acetaminophen (TYLENOL) tablet 650 mg  650 mg Oral Q6H PRN Laverle Hobby, PA-C      . alum & mag hydroxide-simeth (MAALOX/MYLANTA) 200-200-20 MG/5ML suspension 30 mL  30 mL Oral Q4H PRN Patriciaann Clan E, PA-C   30 mL at 07/24/18 1215  . ARIPiprazole (ABILIFY) tablet 2 mg  2 mg Oral Daily Parthena Fergeson, Myer Peer, MD   2 mg at 07/25/18 0814  . cholecalciferol (VITAMIN D3) tablet 400 Units  400 Units Oral Daily Kyiah Canepa, Myer Peer, MD   400 Units at 07/25/18 [phone]  . clonazePAM (KLONOPIN) tablet 0.5 mg  0.5 mg Oral BID PRN Kyzer Blowe, Myer Peer, MD      . hydrOXYzine (ATARAX/VISTARIL) tablet 25 mg  25 mg Oral Q6H PRN Jacen Carlini, Myer Peer, MD   25 mg at 07/24/18 1833  . magnesium hydroxide (MILK OF MAGNESIA) suspension 30 mL  30 mL Oral Daily PRN Patriciaann Clan E, PA-C      . metFORMIN (GLUCOPHAGE) tablet 1,000 mg  1,000 mg Oral BID WC Najir Roop, Myer Peer, MD   1,000 mg at 07/23/18 1715  . sertraline (ZOLOFT) tablet 100 mg  100 mg Oral QHS Yordin Rhoda A, MD      . traZODone (DESYREL) tablet 50 mg  50 mg Oral QHS PRN Krysti Hickling, Myer Peer, MD   50 mg at 07/24/18 2105    Lab Results:  No results found for this or any previous visit (from the past 48 hour(s)).  Blood Alcohol level:  Lab Results  Component Value Date   ETH <10 40/34/7425    Metabolic Disorder Labs: Lab Results  Component Value Date   HGBA1C 5.1 02/23/2018   Lab Results  Component Value Date   PROLACTIN 14.9 04/29/2017   Lab Results  Component Value Date   CHOL 165 05/05/2017   TRIG 78 05/05/2017   HDL 42 05/05/2017   CHOLHDL 3.9 05/05/2017   LDLCALC 107 (H) 05/05/2017    Physical Findings: AIMS: Facial and Oral Movements Muscles of Facial Expression: None,  normal Lips and Perioral Area: None, normal Jaw: None, normal Tongue: None, normal,Extremity Movements Upper (arms, wrists, hands, fingers): None, normal Lower (legs, knees, ankles, toes): None, normal, Trunk Movements Neck, shoulders, hips: None, normal, Overall Severity Severity of abnormal movements (highest score from questions above): None, normal Incapacitation due to abnormal movements: None, normal Patient's awareness of abnormal movements (rate only patient's report): No Awareness, Dental Status Current problems with teeth and/or dentures?: No Does patient usually wear dentures?: No  CIWA:  CIWA-Ar Total: 0 COWS:  COWS Total Score: 0  Musculoskeletal: Strength & Muscle Tone: within normal limits Gait & Station: normal Patient leans: N/A  Psychiatric Specialty Exam: Physical Exam  ROS no chest pain, no shortness of breath, no vomiting, today does not endorse diarrhea  Blood pressure (!) 109/54, pulse 68, temperature 98.4 F (36.9 C), temperature source Oral, resp. rate 18, height 5' 4.57" (1.64 m), weight (!) 142 kg, SpO2 100 %.Body mass index is 52.79 kg/m.  General Appearance: Fairly groomed  Eye Contact:  Good  Speech:  Normal Rate  Volume:  Normal  Mood:  Some residual depression, describes some improvement compared to admission  Affect:  Tends to be constricted, sad, does improve partially during session  Thought Process:  Linear and Descriptions of Associations: Intact  Orientation:  Full (Time, Place, and Person)  Thought Content:  No hallucinations, no delusions  Suicidal Thoughts:  No currently denies suicidal or self-injurious ideations, denies homicidal or violent ideations, contracts for safety on unit  Homicidal Thoughts:  No  Memory:  Recent and remote grossly intact  Judgement:  Other:  Improving  Insight:  Improving  Psychomotor Activity:  Decreased  Concentration:  Concentration: Good and Attention Span: Good  Recall:  Good  Fund of Knowledge:  Good   Language:  Good  Akathisia:  Negative  Handed:  Right  AIMS (if indicated):     Assets:  Communication Skills Desire for Improvement Resilience  ADL's:  Intact  Cognition:  WNL  Sleep:  Number of Hours: 6.75   Assessment: 28 year old female, separated, no children, currently living with a friend, employed. Presented to hospital voluntarily reporting worsening depression, suicidal ideations ( passive) , significant neuro-vegetative symptoms of depression. Reports history of depression over the last year and half after her mother passed away.   Currently patient remains depressed with a relatively constricted affect, although it has become more reactive since admission.  Denies suicidal ideations.  Today ruminative and saddened by not having any family visitors yesterday evening.  She responds well to support, encouragement. She is tolerating medications well other than describing some morning/a.m. sedation.    Treatment Plan Summary: Daily contact with patient to assess and evaluate symptoms and progress in treatment, Medication management, Plan Inpatient treatment and Medications as below  Treatment plan reviewed as below today 1/12 Encourage group and milieu participation to work on coping skills and symptom reduction Treatment team working on disposition planning options Continue Klonopin at 0.5 mg twice daily PRN for anxiety or insomnia Decrease Trazodone to 25  mg nightly PRN for insomnia, in order to minimize a.m. sedation Change Abilify to  2 mg nightly in order to minimize a.m. sedation-as antidepressant augmentation Change Zoloft to 100 mg nightly for depression, anxiety (nightly dosing to minimize sedation) Continue Glucophage for history of PCOS Resume vitamin D supplementation for history of hypo-vitaminosis D.   Check TSH.  Jenne Campus, MD 07/25/2018, 10:48 AM   Patient ID: Emily Frank, female   DOB: 05-Nov-1990, 28 y.o.   MRN: [id number]

## 2018-07-25 NOTE — Plan of Care (Signed)
  Problem: Activity: Goal: Interest or engagement in leisure activities will improve Outcome: Progressing   Problem: Coping: Goal: Will verbalize feelings Outcome: Progressing   Problem: Health Behavior/Discharge Planning: Goal: Compliance with therapeutic regimen will improve Outcome: Progressing   Problem: Safety: Goal: Ability to disclose and discuss suicidal ideas will improve Outcome: Progressing

## 2018-07-26 LAB — TSH: TSH: 1.564 u[IU]/mL (ref 0.350–4.500)

## 2018-07-26 MED ORDER — TRAZODONE HCL 50 MG PO TABS
50.0000 mg | ORAL_TABLET | Freq: Every evening | ORAL | Status: DC | PRN
  Administered 2018-07-26: 50 mg via ORAL

## 2018-07-26 NOTE — Progress Notes (Signed)
D: Pt denies SI/HI/AV hallucinations. Pt is pleasant and cooperative. Pt goal for today was to work on her anger and not cry. A: Pt was offered support and encouragement. Pt was given scheduled medications. Pt was encourage to attend groups. Q 15 minute checks were done for safety.  R:Pt attends groups and interacts well with peers and staff. Pt is taking medication. Pt has no complaints.Pt receptive to treatment and safety maintained on unit.

## 2018-07-26 NOTE — [facility] Suicide Risk Assessment (Signed)
[facility] INPATIENT:  Family/Significant Other Suicide Prevention Education  Suicide Prevention Education:  Education Completed; Emily Frank, friend [phone])  has been identified by the patient as the family member/significant other with whom the patient will be residing, and identified as the person(s) who will aid the patient in the event of a mental health crisis (suicidal ideations/suicide attempt).  With written consent from the patient, the family member/significant other has been provided the following suicide prevention education, prior to the and/or following the discharge of the patient.  The suicide prevention education provided includes the following:  Suicide risk factors  Suicide prevention and interventions  National Suicide Hotline telephone number  [facility] assessment telephone number  [facility] Assistance 911  [facility] and/or [facility]idential Mobile Crisis [facility] telephone number  Request made of family/significant other to:  Remove weapons (e.g., guns, rifles, knives), all items previously/currently identified as safety concern.    Remove drugs/medications (over-the-counter, prescriptions, illicit drugs), all items previously/currently identified as a safety concern.  The family member/significant other verbalizes understanding of the suicide prevention education information provided.  The family member/significant other agrees to remove the items of safety concern listed above.  Emily Frank 07/26/2018, 3:50 PM

## 2018-07-26 NOTE — Progress Notes (Signed)
Nursing Progress Note: 7p-7a D: Pt currently presents with a worrying/animated/pleasant/childlike affect and behavior. Pt states "I need to be swabbed for the flu. I definitely have it. And maybe get swabbed for strep too." Interacting appropriately with the milieu. Pt reports good sleep during the previous night with current medication regimen. Pt did attend wrap-up group.  A: Pt seen laughing and joking in the hall. Pt appears asymptomatic of strep/flu. Pt provided with medications per providers orders. Pt's labs and vitals were monitored throughout the night. Pt supported emotionally and encouraged to express concerns and questions. Pt educated on medications.  R: Pt's safety ensured with 15 minute and environmental checks. Pt currently denies SI, HI, and AVH. Pt verbally contracts to seek staff if SI,HI, or AVH occurs and to consult with staff before acting on any harmful thoughts. Will continue to monitor.

## 2018-07-26 NOTE — Progress Notes (Signed)
[facility] MD Progress Note  07/26/2018 3:28 PM Emily Frank  MRN:  [id number] Subjective: Patient reports persistent depression.  Does feel somewhat better than on admission, but still endorses subjective sense of sadness.  Ruminates about limited family support.  Does not endorse medication side effects, but states she did not sleep as well with lower Trazodone dosing  and requests increased dose. Denies suicidal ideations.   Objective : I have reviewed chart notes and have met with patient. 28 year old female, separated, no children, currently living with a friend, employed. Presented to hospital voluntarily reporting worsening depression, suicidal ideations ( passive) , significant neuro-vegetative symptoms of depression. Reports history of depression over the last year and half after her mother passed away.  She reports some improvement compared to admission but describes persistent depression and continues to present with a constricted, sad affect.  Briefly tearful during session and ruminative about limited family support today.   She has been visible in dayroom, behavior calm and in good control. She denies suicidal ideations, contracts for safety on unit. She had describes some daytime sedation on medication regimen, due to which meds were changed to nightly dosing and Trazodone dose was decreased.  Today reports she did not sleep as well last night. TSH 1.564  Principal Problem:  MDD Diagnosis: Active Problems:   MDD (major depressive disorder), recurrent episode, severe ([place])  Total Time spent with patient: 20 minutes  Past Psychiatric History:   Past Medical History:  Past Medical History:  Diagnosis Date  . Depression   . H/O chlamydia infection   . H/O gonorrhea   . Obesity   . Polycystic ovarian syndrome    History reviewed. No pertinent surgical history. Family History:  Family History  Problem Relation Age of Onset  . Hypertension Mother   . Diabetes Mother   .  Hepatitis C Mother   . Renal Disease Mother   . Hypertension Father   . Hepatitis C Father   . Breast cancer Maternal Grandmother   . Brain cancer Maternal Grandmother   . Bone cancer Maternal Grandmother   . Hypertension Maternal Grandmother   . Renal Disease Maternal Grandmother   . Diabetes Maternal Grandmother   . Colon cancer Maternal Grandfather   . Breast cancer Paternal Grandmother   . Hypertension Paternal Grandmother    Family Psychiatric  History:  Social History:  Social History   Substance and Sexual Activity  Alcohol Use Yes   Comment: Occasional      Social History   Substance and Sexual Activity  Drug Use No    Social History   Socioeconomic History  . Marital status: Legally Separated    Spouse name: Not on file  . Number of children: Not on file  . Years of education: Not on file  . Highest education level: Not on file  Occupational History    Employer: [employer]  . Financial resource strain: Not on file  . Food insecurity:    Worry: Not on file    Inability: Not on file  . Transportation needs:    Medical: Not on file    Non-medical: Not on file  Tobacco Use  . Smoking status: Never Smoker  . Smokeless tobacco: Never Used  Substance and Sexual Activity  . Alcohol use: Yes    Comment: Occasional   . Drug use: No  . Sexual activity: Not Currently    Partners: Male    Birth control/protection:  None  Lifestyle  . Physical activity:    Days per week: Not on file    Minutes per session: Not on file  . Stress: Not on file  Relationships  . Social connections:    Talks on phone: Not on file    Gets together: Not on file    Attends religious service: Not on file    Active member of club or organization: Not on file    Attends meetings of clubs or organizations: Not on file    Relationship status: Not on file  Other Topics Concern  . Not on file  Social History Narrative  . Not on file    Additional Social History:   Sleep: Fair  Appetite:  Good  Current Medications: Current Facility-Administered Medications  Medication Dose Route Frequency Provider Last Rate Last Dose  . acetaminophen (TYLENOL) tablet 650 mg  650 mg Oral Q6H PRN Laverle Hobby, PA-C      . alum & mag hydroxide-simeth (MAALOX/MYLANTA) 200-200-20 MG/5ML suspension 30 mL  30 mL Oral Q4H PRN Patriciaann Clan E, PA-C   30 mL at 07/24/18 1215  . ARIPiprazole (ABILIFY) tablet 2 mg  2 mg Oral QHS Nigel Wessman A, MD      . cholecalciferol (VITAMIN D3) tablet 400 Units  400 Units Oral Daily Deaunna Olarte, Myer Peer, MD   400 Units at 07/26/18 0818  . hydrOXYzine (ATARAX/VISTARIL) tablet 25 mg  25 mg Oral Q6H PRN Kasiyah Platter, Myer Peer, MD   25 mg at 07/26/18 0250  . magnesium hydroxide (MILK OF MAGNESIA) suspension 30 mL  30 mL Oral Daily PRN Patriciaann Clan E, PA-C      . metFORMIN (GLUCOPHAGE) tablet 1,000 mg  1,000 mg Oral BID WC Maekayla Giorgio, Myer Peer, MD   1,000 mg at 07/23/18 1715  . sertraline (ZOLOFT) tablet 100 mg  100 mg Oral QHS Gerritt Galentine, Myer Peer, MD   100 mg at 07/25/18 2102  . traZODone (DESYREL) tablet 50 mg  50 mg Oral QHS PRN Val Schiavo, Myer Peer, MD        Lab Results:  Results for orders placed or performed during the hospital encounter of 07/23/18 (from the past 48 hour(s))  TSH     Status: None   Collection Time: 07/26/18  6:54 AM  Result Value Ref Range   TSH 1.564 0.350 - 4.500 uIU/mL    Comment: Performed by a 3rd Generation assay with a functional sensitivity of <=0.01 uIU/mL. Performed at [facility], [place] [address], [place], [place] [address]     Blood Alcohol level:  Lab Results  Component Value Date   ETH <10 64/40/3474    Metabolic Disorder Labs: Lab Results  Component Value Date   HGBA1C 5.1 02/23/2018   Lab Results  Component Value Date   PROLACTIN 14.9 04/29/2017   Lab Results  Component Value Date   CHOL 165 05/05/2017   TRIG 78 05/05/2017   HDL 42  05/05/2017   CHOLHDL 3.9 05/05/2017   LDLCALC 107 (H) 05/05/2017    Physical Findings: AIMS: Facial and Oral Movements Muscles of Facial Expression: None, normal Lips and Perioral Area: None, normal Jaw: None, normal Tongue: None, normal,Extremity Movements Upper (arms, wrists, hands, fingers): None, normal Lower (legs, knees, ankles, toes): None, normal, Trunk Movements Neck, shoulders, hips: None, normal, Overall Severity Severity of abnormal movements (highest score from questions above): None, normal Incapacitation due to abnormal movements: None, normal Patient's awareness of abnormal movements (rate only patient's report): No Awareness,  Dental Status Current problems with teeth and/or dentures?: No Does patient usually wear dentures?: No  CIWA:  CIWA-Ar Total: 0 COWS:  COWS Total Score: 0  Musculoskeletal: Strength & Muscle Tone: within normal limits Gait & Station: normal Patient leans: N/A  Psychiatric Specialty Exam: Physical Exam  ROS no chest pain, no shortness of breath, no vomiting, today does not endorse diarrhea  Blood pressure 116/71, pulse 89, temperature 98.6 F (37 C), temperature source Oral, resp. rate 18, height 5' 4.57" (1.64 m), weight (!) 142 kg, SpO2 100 %.Body mass index is 52.79 kg/m.  General Appearance: Fairly groomed  Eye Contact:  Good  Speech:  Normal Rate  Volume:  Normal  Mood:  Remains depressed  Affect:  Constricted, briefly tearful, does smile at times appropriately  Thought Process:  Linear and Descriptions of Associations: Intact  Orientation:  Full (Time, Place, and Person)  Thought Content:  No hallucinations, no delusions  Suicidal Thoughts:  No currently denies suicidal or self-injurious ideations, denies homicidal or violent ideations, contracts for safety on unit  Homicidal Thoughts:  No  Memory:  Recent and remote grossly intact  Judgement:  Other:  Improving  Insight:  Improving  Psychomotor Activity:  More visible on unit   Concentration:  Concentration: Good and Attention Span: Good  Recall:  Good  Fund of Knowledge:  Good  Language:  Good  Akathisia:  Negative  Handed:  Right  AIMS (if indicated):     Assets:  Communication Skills Desire for Improvement Resilience  ADL's:  Intact  Cognition:  WNL  Sleep:  Number of Hours: 5.25   Assessment: 28 year old female, separated, no children, currently living with a friend, employed. Presented to hospital voluntarily reporting worsening depression, suicidal ideations ( passive) , significant neuro-vegetative symptoms of depression. Reports history of depression over the last year and half after her mother passed away.   Patient remains depressed, with a constricted/ sad affect.  She denies suicidal ideations.  Currently on Zoloft and Abilify.  No side effects thus far other than mild sedation.    Treatment Plan Summary: Daily contact with patient to assess and evaluate symptoms and progress in treatment, Medication management, Plan Inpatient treatment and Medications as below  Treatment plan reviewed as below today 1/12 Encourage group and milieu participation to work on coping skills and symptom reduction Treatment team working on disposition planning options Continue Klonopin at 0.5 mg twice daily PRN for anxiety or insomnia Increase Trazodone to 50  mg nightly PRN for insomnia, in order to minimize a.m. sedation Continue Abilify  2 mg nightly in order to minimize a.m. sedation-as antidepressant augmentation Continue Zoloft  100 mg nightly for depression, anxiety (nightly dosing to minimize sedation) Continue Glucophage for history of PCOS   Jenne Campus, MD 07/26/2018, 3:28 PM   Patient ID: Lawrence Marseilles, female   DOB: August 19, 1990, 28 y.o.   MRN: [id number]

## 2018-07-26 NOTE — Progress Notes (Signed)
Recreation Therapy Notes  Date: 1.13.20 Time: 0930 Location: [address] Dayroom  Group Topic: Stress Management  Goal Area(s) Addresses:  Patient will identify stress management techniques. Patient will identify benefits of using stress management techniques post d/c.  Intervention: Stress Management  Activity :  Meditation.  LRT introduced the stress management technique of meditation.  LRT played a meditation on being resilient in the face of adversity.  Patients were to follow along as meditation played to engage in activity.  Education:  Stress Management, Discharge Planning.   Education Outcome: Acknowledges Education  Clinical Observations/Feedback: Pt did not attend group.    Caroll Rancher, LRT/CTRS         Caroll Rancher A 07/26/2018 11:58 AM

## 2018-07-26 NOTE — BHH Group Notes (Signed)
Adult Psychoeducational Group Note  Date:  07/26/2018 Time:  9:45 PM  Group Topic/Focus:  Wrap-Up Group:   The focus of this group is to help patients review their daily goal of treatment and discuss progress on daily workbooks.  Participation Level:  Active  Participation Quality:  Appropriate and Attentive  Affect:  Appropriate  Cognitive:  Alert and Appropriate  Insight: Appropriate and Good  Engagement in Group:  Engaged  Modes of Intervention:  Discussion and Education  Additional Comments:  Pt did not attend wrap up group this evening. Pt was in bed not feeling well.   Chrisandra Netters 07/26/2018, 9:45 PM

## 2018-07-26 NOTE — [facility] Group Notes (Signed)
LCSW Group Therapy Note 07/26/2018 11:05 AM  Type of Therapy and Topic: Group Therapy: Overcoming Obstacles  Participation Level: Active  Description of Group:  In this group patients will be encouraged to explore what they see as obstacles to their own wellness and recovery. They will be guided to discuss their thoughts, feelings, and behaviors related to these obstacles. The group will process together ways to cope with barriers, with attention given to specific choices patients can make. Each patient will be challenged to identify changes they are motivated to make in order to overcome their obstacles. This group will be process-oriented, with patients participating in exploration of their own experiences as well as giving and receiving support and challenge from other group members.  Therapeutic Goals: 1. Patient will identify personal and current obstacles as they relate to admission. 2. Patient will identify barriers that currently interfere with their wellness or overcoming obstacles.  3. Patient will identify feelings, thought process and behaviors related to these barriers. 4. Patient will identify two changes they are willing to make to overcome these obstacles:   Summary of Patient Progress  Emily Frank was engaged and participated throughout the group session. Emily Frank reports that her main obstacle is guilt. Emily Frank reports that she feels guilty for not being with her mother "when she needed me the most". Emily Frank reports that her mother passed the week she was on a break from her care taking duties.    Therapeutic Modalities:  Cognitive Behavioral Therapy Solution Focused Therapy Motivational Interviewing Relapse Prevention Therapy   Alcario Drought Clinical Social Worker

## 2018-07-26 NOTE — Plan of Care (Signed)
Progress note  D: pt found in bed; compliant with medication administration. Pt states she slept poorly. Pt rates her depression/hopelessness/anxiety a 5/5/5 out of 10 respectively. Pt denies any physical symptoms or pain, rating her pain a 0/10. Pt states her goal for today is to be ok with her feelings. Pt will achieve this by going to group. Pt denies any si/hi/ah/vh and verbally agrees to approach staff if these become apparent or before harming herself or others while at [facility]. A: pt provided support and encouragement. Pt given medication per protocol and standing orders. Q81m safety checks implemented and continued.  R: pt safe on the unit. Will continue to monitor.   Pt progressing in the following metrics  Problem: Education: Goal: Utilization of techniques to improve thought processes will improve Outcome: Progressing Goal: Knowledge of the prescribed therapeutic regimen will improve Outcome: Progressing   Problem: Coping: Goal: Coping ability will improve Outcome: Progressing   Problem: Health Behavior/Discharge Planning: Goal: Ability to make decisions will improve Outcome: Progressing

## 2018-07-27 MED ORDER — ARIPIPRAZOLE 2 MG PO TABS: 2.0000 mg | ORAL_TABLET | Freq: Every day | ORAL | 0 refills | Status: DC

## 2018-07-27 MED ORDER — METFORMIN HCL 1000 MG PO TABS: 1000.0000 mg | ORAL_TABLET | Freq: Two times a day (BID) | ORAL | 0 refills | Status: DC

## 2018-07-27 MED ORDER — TRAZODONE HCL 50 MG PO TABS: 50.0000 mg | ORAL_TABLET | Freq: Every evening | ORAL | 0 refills | Status: DC | PRN

## 2018-07-27 MED ORDER — SERTRALINE HCL 100 MG PO TABS: 100.0000 mg | ORAL_TABLET | Freq: Every day | ORAL | 0 refills | Status: DC

## 2018-07-27 MED ORDER — CHOLECALCIFEROL 10 MCG (400 UNIT) PO TABS: 400.0000 [IU] | ORAL_TABLET | Freq: Every day | ORAL | 0 refills | Status: DC

## 2018-07-27 MED ORDER — HYDROXYZINE HCL 25 MG PO TABS: 25.0000 mg | ORAL_TABLET | Freq: Four times a day (QID) | ORAL | 0 refills | Status: DC | PRN

## 2018-07-27 NOTE — B[facility] Suicide Risk Assessment (Signed)
[facility] Discharge Suicide Risk Assessment   Principal Problem: <principal problem not specified> Discharge Diagnoses: Active Problems:   MDD (major depressive disorder), recurrent episode, severe ([facility])   Total Time spent with patient: 30 minutes  Musculoskeletal: Strength & Muscle Tone: within normal limits Gait & Station: normal Patient leans: N/A  Psychiatric Specialty Exam: Review of Systems  All other systems reviewed and are negative.   Blood pressure 125/81, pulse 86, temperature 98.5 F (36.9 C), temperature source Oral, resp. rate 16, height 5' 4.57" (1.64 m), weight (!) 142 kg, SpO2 100 %.Body mass index is 52.79 kg/m.  General Appearance: Casual  Eye Contact::  Fair  Speech:  Normal Rate409  Volume:  Normal  Mood:  Euthymic  Affect:  Congruent  Thought Process:  Coherent and Descriptions of Associations: Intact  Orientation:  Full (Time, Place, and Person)  Thought Content:  Logical  Suicidal Thoughts:  No  Homicidal Thoughts:  No  Memory:  Immediate;   Fair Recent;   Fair Remote;   Fair  Judgement:  Intact  Insight:  Fair  Psychomotor Activity:  Normal  Concentration:  Fair  Recall:  [employer] of Knowledge:Fair  Language: Good  Akathisia:  Negative  Handed:  Right  AIMS (if indicated):     Assets:  Communication Skills Desire for Improvement Financial Resources/Insurance Housing Leisure Time Resilience Social Support  Sleep:  Number of Hours: 3.75  Cognition: WNL  ADL's:  Intact   Mental Status Per Nursing Assessment::   On Admission:  Suicidal ideation indicated by patient, Self-harm thoughts, Suicide plan, Intention to act on suicide plan  Demographic Factors:  NA  Loss Factors: Loss of significant relationship  Historical Factors: Impulsivity  Risk Reduction Factors:   Sense of responsibility to family, Employed, Positive social support and Positive therapeutic relationship  Continued Clinical Symptoms:  Depression:    Impulsivity  Cognitive Features That Contribute To Risk:  None    Suicide Risk:  Minimal: No identifiable suicidal ideation.  Patients presenting with no risk factors but with morbid ruminations; may be classified as minimal risk based on the severity of the depressive symptoms  Follow-up Information    [facility] on 07[facility]/2018.   Why:  Please attend your therapy appointment with [place] on Wednesday, 1/15 at 1:15p.  Your medication management with Toni Amend is Wednesday, 2/5 at 10:15a.  Contact information: [address] [place] [address] [phone]           Plan Of Care/Follow-up recommendations:  Activity:  ad lib  Antonieta Pert, MD 07/27/2018, 8:56 AM

## 2018-07-27 NOTE — Progress Notes (Signed)
  [facility] Adult Case Management Discharge Plan :  Will you be returning to the same living situation after discharge:  No. Patient reports that she is discharging home with her sister.  At discharge, do you have transportation home?: Yes,  patient reports her friend is picking her up at discharge  Do you have the ability to pay for your medications: Yes,  [employer]   Release of information consent forms completed and in the chart;  Patient's signature needed at discharge.  Patient to Follow up at: Follow-up Information    [facility] Go on 07/28/2018.   Why:  Please attend your therapy appointment with [place] on Wednesday, 1/15 at 1:15p.  Your medication management with Toni Amend is Wednesday, 2/5 at 10:15a.  Contact information: [address] [place] [address] [phone]           Next level of care provider has access to [facility] Link:yes  Safety Planning and Suicide Prevention discussed: Yes,  with the patient's friend     Has patient been referred to the Quitline?: N/A patient is not a smoker  Patient has been referred for addiction treatment: N/A  Maeola Sarah, LCSWA 07/27/2018, 10:27 AM

## 2018-07-27 NOTE — Progress Notes (Signed)
Discharge Note:  Patient discharged home with family member.  Patient denied SI and HI.  Denied A/V hallucinations.  Suicide prevention information given and discussed with patient who stated she understood and had no questions.  My[employer] App also given to patient.  Survey given to patient.  Patient stated she received all her belongings, clothing, misc items, toiletries, etc.  Patient stated she appreciated all assistance received from [facility] staff.  All required discharge information given to patient at discharge.

## 2018-07-27 NOTE — Progress Notes (Signed)
D:  Patient's self inventory sheet, patient sleeps good, sleep medication helpful.  Good appetite, normal energy level, good concentration.  Rated depression 2, denied hopeless, anxiety 3.  Denied withdrawals.  Denied SI.  Denied physical problems.  Denied physical pain.  Goal is discharge.  Plans to attend group.  No discharge plans. A:  Medications administered per MD orders.  Emotional support and encouragement given patient. R:  Denied SI and HI, contracts for safety.  Denied A/V hallucinations.  Safety maintained with 15 minute checks.

## 2018-07-27 NOTE — Discharge Summary (Signed)
Physician Discharge Summary Note  Patient:  Emily Frank is an 28 y.o., female MRN:  [id number] DOB:  04/07/1991 Patient phone:  [phone] (home)  Patient address:   [address] [place] [id number],  Total Time spent with patient: 15 minutes  Date of Admission:  07/23/2018 Date of Discharge: 07/27/2018  Reason for Admission:  Depression with suicidal ideation  Principal Problem: MDD (major depressive disorder), recurrent episode, severe (HCC) Discharge Diagnoses: Principal Problem:   MDD (major depressive disorder), recurrent episode, severe (HCC)   Past Psychiatric History: Per admission H&P: no prior psychiatric admissions, reports history of suicide attempts by overdosing, most recently in December 2019, by overdosing . States she did not seek treatment at the time.Denies history of self cutting or self injurious behaviors. Denies history of mania or hypomania. History of panic attacks, denies agoraphobia. Denies history of psychosis. Patient states she started experiencing depression after her mother passed away in October of 2018.  Past Medical History:  Past Medical History:  Diagnosis Date  . Depression   . H/O chlamydia infection   . H/O gonorrhea   . Obesity   . Polycystic ovarian syndrome    History reviewed. No pertinent surgical history. Family History:  Family History  Problem Relation Age of Onset  . Hypertension Mother   . Diabetes Mother   . Hepatitis C Mother   . Renal Disease Mother   . Hypertension Father   . Hepatitis C Father   . Breast cancer Maternal Grandmother   . Brain cancer Maternal Grandmother   . Bone cancer Maternal Grandmother   . Hypertension Maternal Grandmother   . Renal Disease Maternal Grandmother   . Diabetes Maternal Grandmother   . Colon cancer Maternal Grandfather   . Breast cancer Paternal Grandmother   . Hypertension Paternal Grandmother    Family Psychiatric  History: Per admission H&P: Maternal Aunt has history of  Schizophrenia. No suicides in family. Father has history of alcohol and cocaine abuse . Social History:  Social History   Substance and Sexual Activity  Alcohol Use Yes   Comment: Occasional      Social History   Substance and Sexual Activity  Drug Use No    Social History   Socioeconomic History  . Marital status: Legally Separated    Spouse name: Not on file  . Number of children: Not on file  . Years of education: Not on file  . Highest education level: Not on file  Occupational History    Employer: [employer]  Social Needs  . Financial resource strain: Not on file  . Food insecurity:    Worry: Not on file    Inability: Not on file  . Transportation needs:    Medical: Not on file    Non-medical: Not on file  Tobacco Use  . Smoking status: Never Smoker  . Smokeless tobacco: Never Used  Substance and Sexual Activity  . Alcohol use: Yes    Comment: Occasional   . Drug use: No  . Sexual activity: Not Currently    Partners: Male    Birth control/protection: None  Lifestyle  . Physical activity:    Days per week: Not on file    Minutes per session: Not on file  . Stress: Not on file  Relationships  . Social connections:    Talks on phone: Not on file    Gets together: Not on file    Attends religious service: Not on file  Active member of club or organization: Not on file    Attends meetings of clubs or organizations: Not on file    Relationship status: Not on file  Other Topics Concern  . Not on file  Social History Narrative  . Not on file    Hospital Course:  Per admission H&P 07/23/2018:  Patient is a 28 year old female, who presented to ED voluntarily. Had spoken with her therapist on the phone and was encouraged to come to ED. Describes worsening depression over the last several weeks. States depression became severe enough to interfere with her work and she needed to take some time off. States " I get these moments when I  break down and I cry for no reason". Endorses neuro-vegetative symptoms of depression as below. Denies psychotic symptoms. States she has been depressed since her mother passed away a year and half ago. She also lost her apartment recently and is now staying with a friend.   Ms. Joanell Rising was admitted for depression with suicidal ideation. She remained on the [facility] unit for 4 days. Zoloft was continued, and Abilify was added. She was tapered off Klonopin without withdrawal symptoms. PRN trazodone and PRN Vistaril were started. Vitamin D supplementation was restarted (vit D 5.4 on 06/25/18). Medications were well-tolerated with no adverse effects reported. The patient stabilized with medication and therapy. She was discharged on the medications listed below. She has shown improvement with improved mood, affect, sleep, appetite, and interaction. She denies any SI/HI/AVH and contracts for safety. Patient agrees to follow up at [facility]. She is provided with prescriptions for medications upon discharge.  Physical Findings: AIMS: Facial and Oral Movements Muscles of Facial Expression: None, normal Lips and Perioral Area: None, normal Jaw: None, normal Tongue: None, normal,Extremity Movements Upper (arms, wrists, hands, fingers): None, normal Lower (legs, knees, ankles, toes): None, normal, Trunk Movements Neck, shoulders, hips: None, normal, Overall Severity Severity of abnormal movements (highest score from questions above): None, normal Incapacitation due to abnormal movements: None, normal Patient's awareness of abnormal movements (rate only patient's report): No Awareness, Dental Status Current problems with teeth and/or dentures?: No Does patient usually wear dentures?: No  CIWA:  CIWA-Ar Total: 1 COWS:  COWS Total Score: 2  Musculoskeletal: Strength & Muscle Tone: within normal limits Gait & Station: normal Patient leans: N/A  Psychiatric Specialty Exam: Physical Exam   Nursing note and vitals reviewed. Constitutional: She is oriented to person, place, and time. She appears well-developed and well-nourished.  Cardiovascular: Normal rate.  Respiratory: Effort normal.  Neurological: She is alert and oriented to person, place, and time.    Review of Systems  Constitutional: Negative.   Respiratory: Negative.   Cardiovascular: Negative.   Psychiatric/Behavioral: Positive for depression (stable on medication). Negative for hallucinations, memory loss, substance abuse and suicidal ideas.    Blood pressure 125/81, pulse 86, temperature 98.5 F (36.9 C), temperature source Oral, resp. rate 16, height 5' 4.57" (1.64 m), weight (!) 142 kg, SpO2 100 %.Body mass index is 52.79 kg/m.  See MD's discharge SRA        Has this patient used any form of tobacco in the last 30 days? (Cigarettes, Smokeless Tobacco, Cigars, and/or Pipes)  No  Blood Alcohol level:  Lab Results  Component Value Date   ETH <10 07/23/2018    Metabolic Disorder Labs:  Lab Results  Component Value Date   HGBA1C 5.1 02/23/2018   Lab Results  Component Value Date   PROLACTIN 14.9 04/29/2017  Lab Results  Component Value Date   CHOL 165 05/05/2017   TRIG 78 05/05/2017   HDL 42 05/05/2017   CHOLHDL 3.9 05/05/2017   LDLCALC 107 (H) 05/05/2017    See Psychiatric Specialty Exam and Suicide Risk Assessment completed by Attending Physician prior to discharge.  Discharge destination:  Home  Is patient on multiple antipsychotic therapies at discharge:  No   Has Patient had three or more failed trials of antipsychotic monotherapy by history:  No  Recommended Plan for Multiple Antipsychotic Therapies: NA  Discharge Instructions    Discharge instructions   Complete by:  As directed    Patient is instructed to take all prescribed medications as recommended. Report any side effects or adverse reactions to your outpatient psychiatrist. Patient is instructed to abstain from  alcohol and illegal drugs while on prescription medications. In the event of worsening symptoms, patient is instructed to call the crisis hotline, 911, or go to the nearest emergency department for evaluation and treatment.     Allergies as of 07/27/2018   No Known Allergies     Medication List    STOP taking these medications   CLONAZEPAM PO     TAKE these medications     Indication  ARIPiprazole 2 MG tablet Commonly known as:  ABILIFY Take 1 tablet (2 mg total) by mouth at bedtime. For mood  Indication:  Mood   cholecalciferol 10 MCG (400 UNIT) Tabs tablet Commonly known as:  VITAMIN D3 Take 1 tablet (400 Units total) by mouth daily. Start taking on:  July 28, 2018  Indication:  Supplementation   Dulaglutide 1.5 MG/0.5ML Sopn Commonly known as:  TRULICITY Inject 1.5 mg into the skin [place] weekly. What changed:  additional instructions  Indication:  Type 2 Diabetes   hydrOXYzine 25 MG tablet Commonly known as:  ATARAX/VISTARIL Take 1 tablet (25 mg total) by mouth every 6 (six) hours as needed for anxiety.  Indication:  Feeling Anxious   metFORMIN 1000 MG tablet Commonly known as:  GLUCOPHAGE Take 1 tablet (1,000 mg total) by mouth 2 (two) times daily with a meal. For blood sugar What changed:  additional instructions  Indication:  Type 2 Diabetes   sertraline 100 MG tablet Commonly known as:  ZOLOFT Take 1 tablet (100 mg total) by mouth at bedtime. For mood  Indication:  Mood   traZODone 50 MG tablet Commonly known as:  DESYREL Take 1 tablet (50 mg total) by mouth at bedtime as needed for sleep.  Indication:  Trouble Sleeping      Follow-up Information    [facility] on 07/28/2018.   Why:  Please attend your therapy appointment with [place] on Wednesday, 1/15 at 1:15p.  Your medication management with Toni AmendCourtney is Wednesday, 2/5 at 10:15a.  Contact information: [address] [place] [id number] [phone]            Follow-up recommendations: Activity as tolerated. Diet as recommended by primary care physician. Keep all scheduled follow-up appointments as recommended.   Comments:   Patient is instructed to take all prescribed medications as recommended. Report any side effects or adverse reactions to your outpatient psychiatrist. Patient is instructed to abstain from alcohol and illegal drugs while on prescription medications. In the event of worsening symptoms, patient is instructed to call the crisis hotline, 911, or go to the nearest emergency department for evaluation and treatment.  Signed: Aldean BakerJanet E Hristopher Missildine, NP 07/27/2018, 1:29 PM

## 2018-08-19 ENCOUNTER — Ambulatory Visit: Payer: No Typology Code available for payment source | Admitting: Obstetrics and Gynecology

## 2018-09-03 ENCOUNTER — Emergency Department (HOSPITAL_COMMUNITY)
Admission: EM | Admit: 2018-09-03 | Discharge: 2018-09-04 | Disposition: A | Discharge status: Home / Self Care | Payer: No Typology Code available for payment source | Attending: Emergency Medicine | Admitting: Emergency Medicine

## 2018-09-03 ENCOUNTER — Encounter (HOSPITAL_COMMUNITY): Payer: Self-pay | Admitting: Emergency Medicine

## 2018-09-03 ENCOUNTER — Other Ambulatory Visit: Payer: Self-pay

## 2018-09-03 DIAGNOSIS — L239 Allergic contact dermatitis, unspecified cause: Secondary | ICD-10-CM | POA: Diagnosis not present

## 2018-09-03 DIAGNOSIS — R21 Rash and other nonspecific skin eruption: Secondary | ICD-10-CM | POA: Diagnosis present

## 2018-09-03 MED ORDER — FAMOTIDINE 20 MG PO TABS
20.0000 mg | ORAL_TABLET | Freq: Once | ORAL | Status: AC
  Administered 2018-09-04: 20 mg via ORAL
  Filled 2018-09-03: qty 1

## 2018-09-03 MED ORDER — PREDNISONE 20 MG PO TABS
60.0000 mg | ORAL_TABLET | Freq: Once | ORAL | Status: AC
  Administered 2018-09-04: 60 mg via ORAL
  Filled 2018-09-03: qty 3

## 2018-09-03 NOTE — ED Triage Notes (Addendum)
Patient c/o itching and burning rash to chest and "burning in throat" since eating at [place] x1 hour ago. Denies difficulty breathing and swelling. Denies known allergies.

## 2018-09-04 ENCOUNTER — Encounter (HOSPITAL_COMMUNITY): Payer: Self-pay | Admitting: Emergency Medicine

## 2018-09-04 MED ORDER — FAMOTIDINE 20 MG PO TABS: 20.0000 mg | ORAL_TABLET | Freq: Two times a day (BID) | ORAL | 0 refills | Status: DC

## 2018-09-04 MED ORDER — ACETAMINOPHEN 500 MG PO TABS
1000.0000 mg | ORAL_TABLET | Freq: Once | ORAL | Status: AC
  Administered 2018-09-04: 1000 mg via ORAL
  Filled 2018-09-04: qty 2

## 2018-09-04 MED ORDER — PREDNISONE 20 MG PO TABS: ORAL_TABLET | ORAL | 0 refills | Status: DC

## 2018-09-04 NOTE — ED Provider Notes (Signed)
[facility]   CSN: [id number] Arrival date & time: 09/03/18  2219    History   Chief Complaint Chief Complaint  Patient presents with  . Allergic Reaction    HPI Emily Frank is a 28 y.o. female.     The history is provided by the patient.  Allergic Reaction  Presenting symptoms: itching and rash   Presenting symptoms: no difficulty breathing, no difficulty swallowing, no swelling and no wheezing   Severity:  Mild Prior allergic episodes:  No prior episodes Context comment:  Ate at [facility] but cannot say if changes in soap or detergent,  rash is in a collar like distribution at the clavicles and around sides of neck Relieved by:  Nothing Worsened by:  Nothing Ineffective treatments:  Antihistamines States she ate at [facility] and has never had an issue before.  Is unsure of detergent, soap or other exposure.    Past Medical History:  Diagnosis Date  . Depression   . H/O chlamydia infection   . H/O gonorrhea   . Obesity   . Polycystic ovarian syndrome     Patient Active Problem List   Diagnosis Date Noted  . MDD (major depressive disorder), recurrent episode, severe (HCC) 07/23/2018  . Panic attacks 02/23/2018  . B12 deficiency 12/13/2017  . Insulin resistance 12/13/2017  . Depression 10/23/2017  . PCOS (polycystic ovarian syndrome) 04/29/2017  . Morbid obesity (HCC) 04/29/2017    History reviewed. No pertinent surgical history.   OB History    Gravida  1   Para  0   Term  0   Preterm  0   AB  1   Living  0     SAB  1   TAB  0   Ectopic  0   Multiple  0   Live Births  0            Home Medications    Prior to Admission medications   Medication Sig Start Date End Date Taking? Authorizing Provider  ARIPiprazole (ABILIFY) 2 MG tablet Take 1 tablet (2 mg total) by mouth at bedtime. For mood Patient taking differently: Take 2 mg by mouth at bedtime as needed. For mood 07/27/18  Yes  Aldean Baker, NP  BLACK ELDERBERRY PO Take 2 tablets by mouth daily.   Yes [provider]  clonazePAM (KLONOPIN) 0.5 MG tablet Take 0.5 mg by mouth 3 (three) times daily as needed for anxiety.  06/07/18  Yes [provider]  Dulaglutide (TRULICITY) 1.5 MG/0.5ML SOPN Inject 1.5 mg into the skin [place] weekly. Patient taking differently: Inject 1.5 mg into the skin [place] weekly. mondays 06/25/18  Yes Claiborne Rigg, NP  metFORMIN (GLUCOPHAGE) 1000 MG tablet Take 1 tablet (1,000 mg total) by mouth 2 (two) times daily with a meal. For blood sugar Patient taking differently: Take 1,000 mg by mouth daily with breakfast. For blood sugar 07/27/18  Yes Aldean Baker, NP  Probiotic Product (PROBIOTIC-10) CHEW Chew 2 tablets by mouth daily.   Yes [provider]  sertraline (ZOLOFT) 100 MG tablet Take 1 tablet (100 mg total) by mouth at bedtime. For mood 07/27/18  Yes Aldean Baker, NP  traZODone (DESYREL) 50 MG tablet Take 1 tablet (50 mg total) by mouth at bedtime as needed for sleep. Patient taking differently: Take 100 mg by mouth at bedtime as needed for sleep.  07/27/18  Yes Aldean Baker, NP  cholecalciferol (VITAMIN D3) 10  MCG (400 UNIT) TABS tablet Take 1 tablet (400 Units total) by mouth daily. Patient not taking: Reported on 09/03/2018 07/28/18   Aldean Baker, NP  hydrOXYzine (ATARAX/VISTARIL) 25 MG tablet Take 1 tablet (25 mg total) by mouth every 6 (six) hours as needed for anxiety. Patient not taking: Reported on 09/03/2018 07/27/18   Aldean Baker, NP    Family History Family History  Problem Relation Age of Onset  . Hypertension Mother   . Diabetes Mother   . Hepatitis C Mother   . Renal Disease Mother   . Hypertension Father   . Hepatitis C Father   . Breast cancer Maternal Grandmother   . Brain cancer Maternal Grandmother   . Bone cancer Maternal Grandmother   . Hypertension Maternal Grandmother   . Renal Disease Maternal Grandmother   . Diabetes Maternal  Grandmother   . Colon cancer Maternal Grandfather   . Breast cancer Paternal Grandmother   . Hypertension Paternal Grandmother     Social History Social History   Tobacco Use  . Smoking status: Never Smoker  . Smokeless tobacco: Never Used  Substance Use Topics  . Alcohol use: Yes    Comment: Occasional   . Drug use: No     Allergies   Patient has no known allergies.   Review of Systems Review of Systems  Constitutional: Negative for fever.  HENT: Negative for drooling, facial swelling and trouble swallowing.   Respiratory: Negative for wheezing.   Skin: Positive for itching and rash.  All other systems reviewed and are negative.    Physical Exam Updated Vital Signs BP (!) 100/33   Pulse 75   Temp 98.4 F (36.9 C) (Oral)   Resp 18   LMP 08/30/2018   SpO2 100%   Physical Exam Vitals signs and nursing note reviewed.  Constitutional:      Appearance: She is obese. She is not ill-appearing.  HENT:     Head: Normocephalic and atraumatic.     Comments: No swelling of the lips, tongue or uvula    Nose: Nose normal.     Mouth/Throat:     Mouth: Mucous membranes are moist.     Pharynx: Oropharynx is clear.  Eyes:     Conjunctiva/sclera: Conjunctivae normal.     Pupils: Pupils are equal, round, and reactive to light.  Neck:     Musculoskeletal: Normal range of motion and neck supple.     Comments: Confluent maculopapular eruption surrounding the base of the neck consistent with contact dermatitis Cardiovascular:     Rate and Rhythm: Normal rate and regular rhythm.     Pulses: Normal pulses.     Heart sounds: Normal heart sounds.  Pulmonary:     Effort: Pulmonary effort is normal.     Breath sounds: Normal breath sounds. No stridor. No wheezing.  Abdominal:     General: Bowel sounds are normal.     Palpations: Abdomen is soft.     Tenderness: There is no abdominal tenderness.  Musculoskeletal: Normal range of motion.  Skin:    General: Skin is warm and  dry.     Capillary Refill: Capillary refill takes less than 2 seconds.  Neurological:     General: No focal deficit present.     Mental Status: She is alert and oriented to person, place, and time.  Psychiatric:        Mood and Affect: Mood normal.        Behavior: Behavior normal.  ED Treatments / Results  Labs (all labs ordered are listed, but only abnormal results are displayed) Labs Reviewed - No data to display  EKG None  Radiology No results found.  Procedures Procedures (including critical care time)  Medications Ordered in ED Medications  acetaminophen (TYLENOL) tablet 1,000 mg (has no administration in time range)  predniSONE (DELTASONE) tablet 60 mg (60 mg Oral Given 09/04/18 0016)  famotidine (PEPCID) tablet 20 mg (20 mg Oral Given 09/04/18 0016)      Final Clinical Impressions(s) / ED Diagnoses   Continue Benadryl every 6 hours at home.  Will start Pepcid and steroids.    Return for pain, intractable cough, fevers >100.4 unrelieved by medication, shortness of breath, intractable vomiting, chest pain, shortness of breath, weakness numbness, changes in speech, facial asymmetry,abdominal pain, passing out,Inability to tolerate liquids or food, cough, altered mental status or any concerns. No signs of systemic illness or infection. The patient is nontoxic-appearing on exam and vital signs are within normal limits.   I have reviewed the triage vital signs and the nursing notes. Pertinent labs &imaging results that were available during my care of the patient were reviewed by me and considered in my medical decision making (see chart for details).  After history, exam, and medical workup I feel the patient has been appropriately medically screened and is safe for discharge home. Pertinent diagnoses were discussed with the patient. Patient was given return precautions.    Gervis Gaba, MD 09/04/18 [phone]

## 2018-09-14 ENCOUNTER — Ambulatory Visit: Payer: No Typology Code available for payment source | Admitting: Psychiatry

## 2018-10-01 ENCOUNTER — Other Ambulatory Visit: Payer: Self-pay | Admitting: Nurse Practitioner

## 2018-10-01 MED ORDER — PHENTERMINE HCL 37.5 MG PO TABS: 37.5000 mg | ORAL_TABLET | Freq: Every day | ORAL | 1 refills | Status: DC

## 2018-11-04 ENCOUNTER — Ambulatory Visit: Payer: No Typology Code available for payment source | Attending: Family Medicine | Admitting: Family Medicine

## 2018-11-04 ENCOUNTER — Other Ambulatory Visit: Payer: Self-pay

## 2018-11-04 ENCOUNTER — Encounter: Payer: Self-pay | Admitting: Family Medicine

## 2018-11-04 DIAGNOSIS — K529 Noninfective gastroenteritis and colitis, unspecified: Secondary | ICD-10-CM

## 2018-11-04 MED ORDER — ONDANSETRON HCL 4 MG PO TABS: ORAL_TABLET | ORAL | 0 refills | Status: DC

## 2018-11-04 MED ORDER — DIPHENOXYLATE-ATROPINE 2.5-0.025 MG PO TABS: 1.0000 | ORAL_TABLET | Freq: Four times a day (QID) | ORAL | 0 refills | Status: DC | PRN

## 2018-11-04 NOTE — Progress Notes (Signed)
Virtual Visit via Telephone Note  I connected with Emily Frank on 11/04/18 at  8:50 AM EDT by telephone and verified that I am speaking with the correct person using two identifiers.   I discussed the limitations, risks, security and privacy concerns of performing an evaluation and management service by telephone and the availability of in person appointments. I also discussed with the patient that there may be a patient responsible charge related to this service. The patient expressed understanding and agreed to proceed.  Patient location: home Provider location: Office Call initiated by CMA, [place]  History of Present Illness:      28 yo female who reports that she bought shrimp last night from [employer] in order to make a dish for dinner which both she and her roommate ate. Patient felt that the shrimp did not taste "quite right". Around 3 am this morning she had acute onset of nausea, vomiting and diarrhea which has been recurrent since that time with last episode of vomiting and diarrhea around 8 am. She has now taken some Zofran that she already had at home. She has not eaten anything yet but has had fluids. She feels weak and has mid upper abdominal pain which she thinks is from throwing up and some generalized crampy lower abdominal pain- both about a 3-4 on a 0-10 scale. No fever or chills. Her roommate has had onset of similar symptoms. Patient thinks that she may have gotten food poisoning due to the shrimp.   Past Medical History:  Diagnosis Date  . Depression   . H/O chlamydia infection   . H/O gonorrhea   . Obesity   . Polycystic ovarian syndrome    Family History  Problem Relation Age of Onset  . Hypertension Mother   . Diabetes Mother   . Hepatitis C Mother   . Renal Disease Mother   . Hypertension Father   . Hepatitis C Father   . Breast cancer Maternal Grandmother   . Brain cancer Maternal Grandmother   . Bone cancer Maternal Grandmother   . Hypertension  Maternal Grandmother   . Renal Disease Maternal Grandmother   . Diabetes Maternal Grandmother   . Colon cancer Maternal Grandfather   . Breast cancer Paternal Grandmother   . Hypertension Paternal Grandmother    Social History   Tobacco Use  . Smoking status: Never Smoker  . Smokeless tobacco: Never Used  Substance Use Topics  . Alcohol use: Yes    Comment: Occasional   . Drug use: No   No Known Allergies   Review of Systems  Constitutional: Positive for diaphoresis and malaise/fatigue. Negative for chills and fever.  HENT: Negative for congestion and sore throat.   Eyes: Negative for blurred vision and double vision.  Respiratory: Negative for cough and shortness of breath.   Cardiovascular: Negative for chest pain and palpitations.  Gastrointestinal: Positive for abdominal pain, diarrhea, nausea and vomiting. Negative for blood in stool, heartburn and melena.  Genitourinary: Negative for dysuria and frequency.  Skin: Negative for itching and rash.  Neurological: Positive for dizziness and weakness. Negative for headaches.      Observations/Objective: No vital signs or physical exam done at today's visit as visit was conducted by telephone  Assessment and Plan: 1. Gastroenteritis Patient with N/V/D acute onset which may have been related to eating shrimp for dinner yesterday as her roommate who ate the same meal also now has the same symptoms. Patient is encouraged to remain well hydrated with  water or water Gatorade mixture and slowly advance diet with bland foods. Take zofran as needed and new RX sent to her pharmacy. If she is unable to keep down liquids she is instructed to go to urgent care for further evaluation as she may need IV fluids, IV meds and blood work to check for dehydration or electrolyte abnormalities. She is encouraged to purchase otc Imodium and take per package instructions but RX sent in for lomotil if imodium is not effective - ondansetron (ZOFRAN) 4 MG  tablet; One pill every 6-8 hours as needed for nausea and vomiting  Dispense: 20 tablet; Refill: 0 - diphenoxylate-atropine (LOMOTIL) 2.5-0.025 MG tablet; Take 1 tablet by mouth 4 (four) times daily as needed for diarrhea or loose stools. That have not improved with the use of Imodium  Dispense: 20 tablet; Refill: 0  Follow Up Instructions:Return if symptoms worsen or fail to improve, for go to urgent care/ED if symptoms worsen.    I discussed the assessment and treatment plan with the patient. The patient was provided an opportunity to ask questions and all were answered. The patient agreed with the plan and demonstrated an understanding of the instructions.   The patient was advised to call back or seek an in-person evaluation if the symptoms worsen or if the condition fails to improve as anticipated.  I provided 12 minutes of non-face-to-face time during this encounter.   Cain Saupeammie Gilles Trimpe, MD

## 2018-11-04 NOTE — Progress Notes (Signed)
Per pt she got shrimp from the store and cooked it at the house. Per pt she thinks she have food poisoning.   Per pt she just feeling like her stomach is k=in a knot and a lot of cramping.  Started this morning around 3am.

## 2018-11-05 IMAGING — US US ABDOMEN COMPLETE
1 series · 14 of 25 positions shown · non-contrast
Comparison: [facility] Jim CT Abdomen and Pelvis 01/04/2015.

CLINICAL DATA: 26-year-old female with morbid obesity. Bariatric
surgical planning.

EXAM:
ABDOMEN ULTRASOUND COMPLETE

[Series 1: us abdomen complete · 14 of 92 slices shown]
[im 1/92]
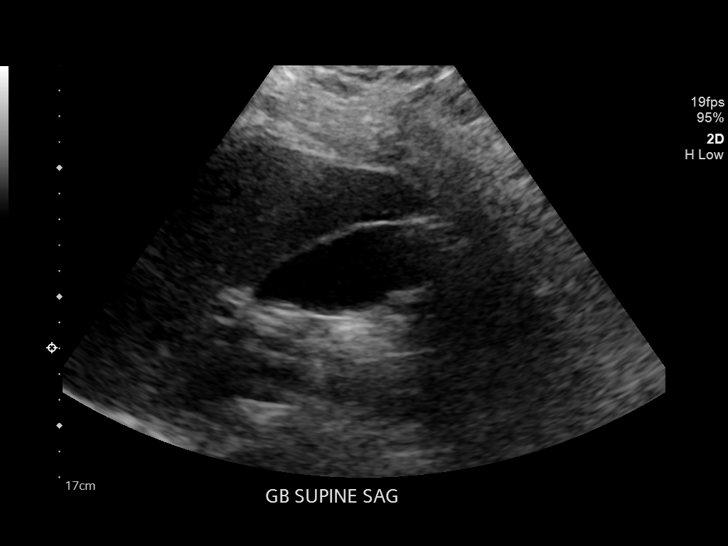
[im 8/92]
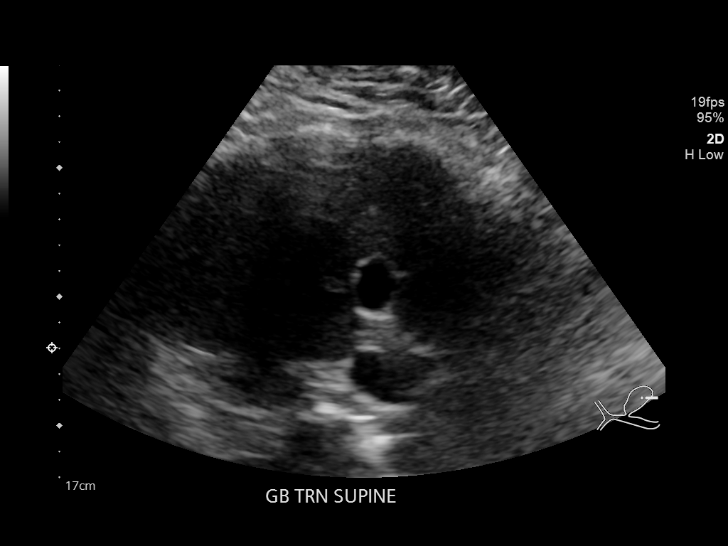
[im 16/92]
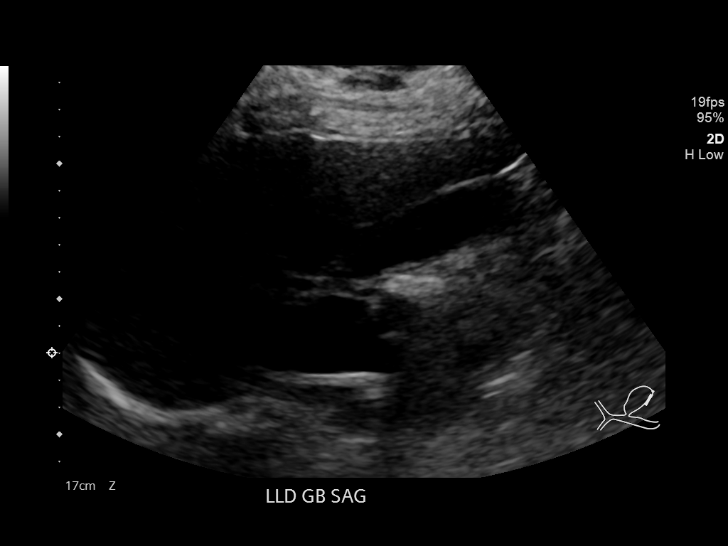
[im 23/92]
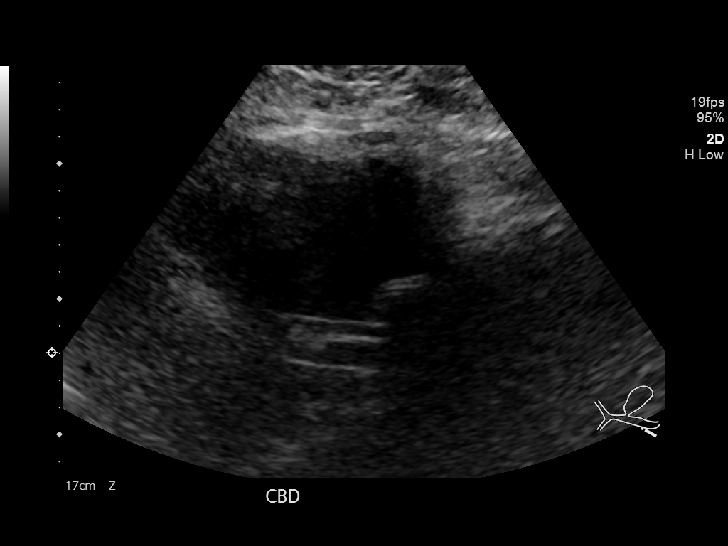
[im 31/92]
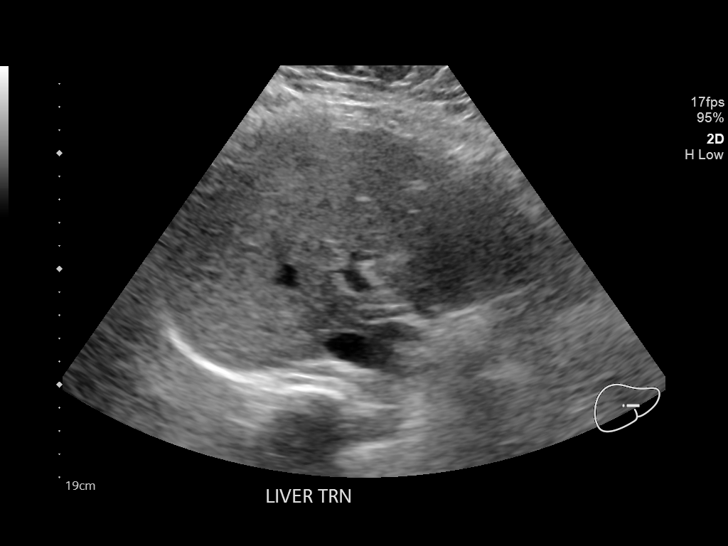
[im 35/92]
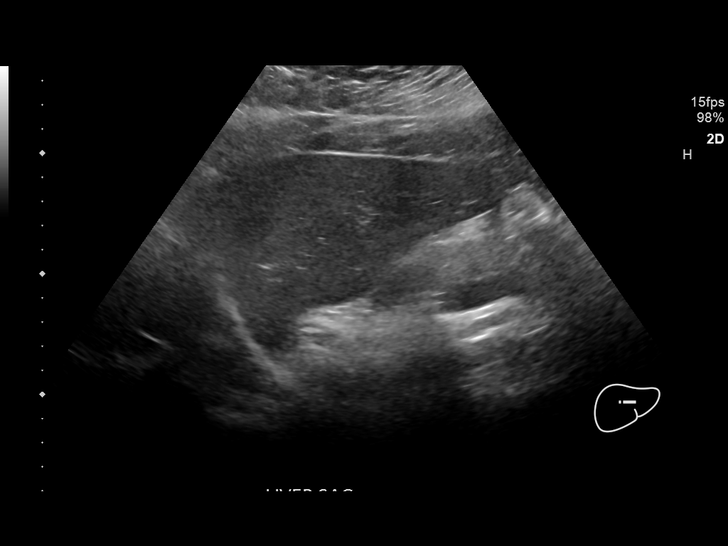
[im 42/92]
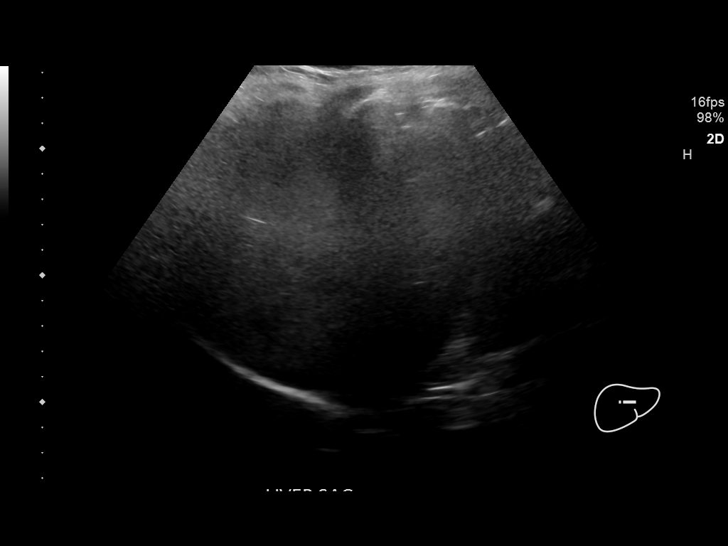
[im 50/92]
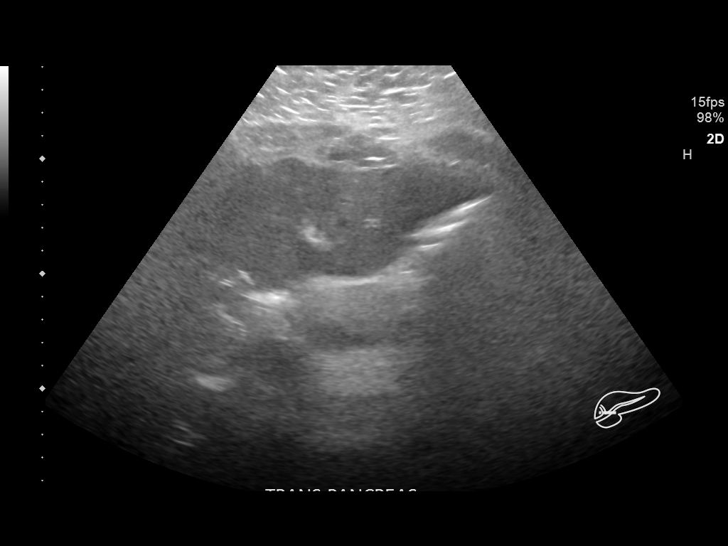
[im 57/92]
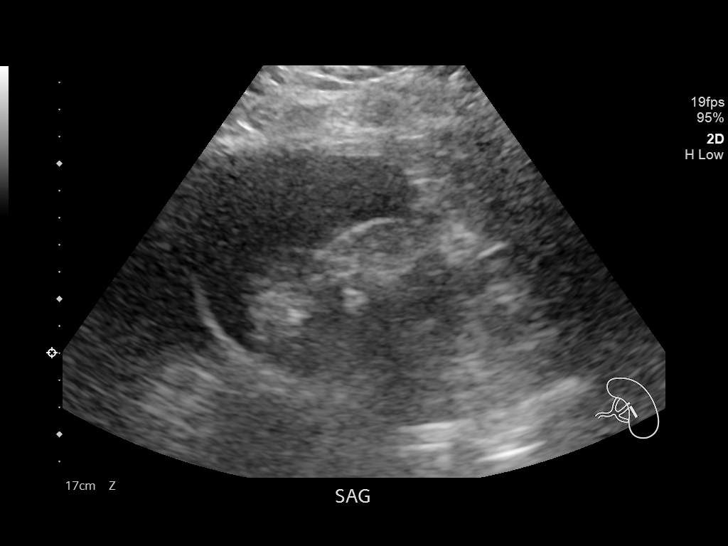
[im 61/92]
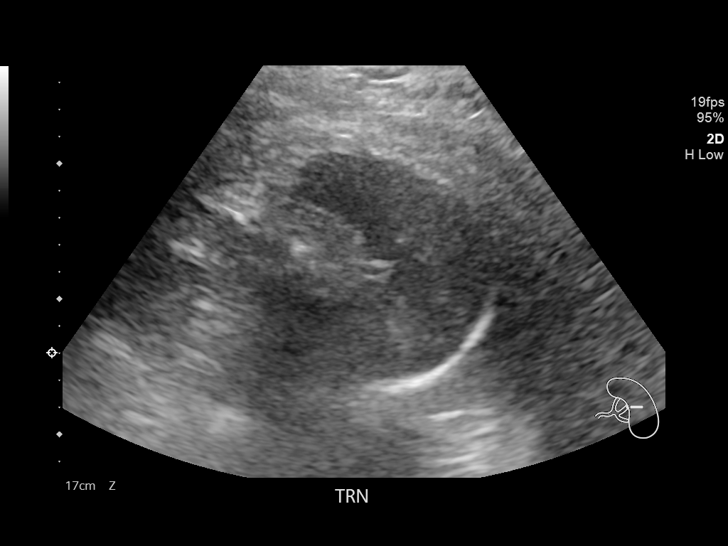
[im 69/92]
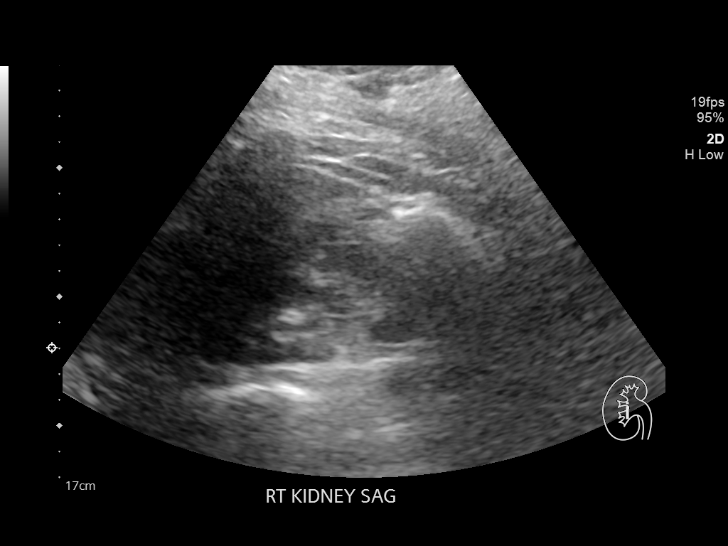
[im 76/92]
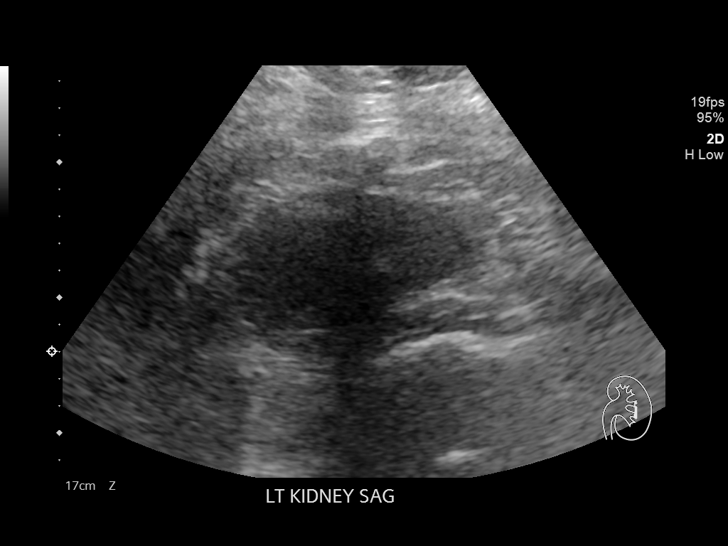
[im 84/92]
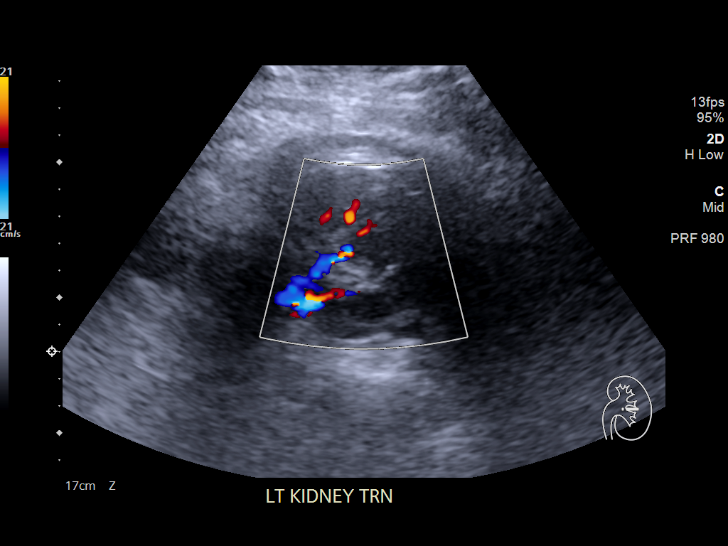
[im 92/92]
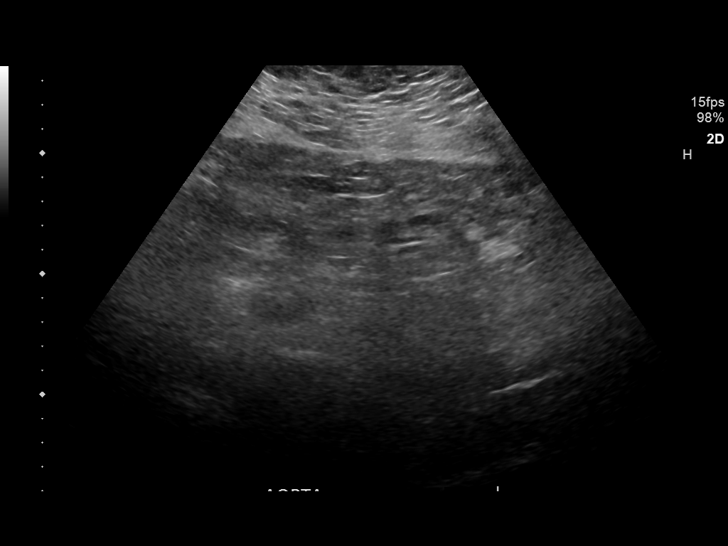

[14 of 25 positions shown; findings below may reference images not displayed]

FINDINGS: Gallbladder: No gallstones or wall thickening visualized. No
sonographic Murphy sign noted by sonographer.

Common bile duct: Diameter: 5 millimeters, normal.

Liver: Liver echogenicity appears at the upper limits of normal. No
discrete liver lesion is identified. Portal vein is patent on color
Doppler imaging with normal direction of blood flow towards the
liver.

IVC: No abnormality visualized.

Pancreas: Visualized portion unremarkable.

Spleen: Size and appearance within normal limits.

Right Kidney: Length: 11.1 centimeters. Echogenicity within normal
limits. No mass or hydronephrosis visualized.

Left Kidney: Length: 10.6 centimeters. Echogenicity within normal
limits. No mass or hydronephrosis visualized.

Abdominal aorta: Incompletely visualized due to overlying bowel gas,
visualized portions within normal limits.

Other findings: None.
IMPRESSION: Ultrasound appearance of the abdomen is within normal limits.

## 2018-11-06 ENCOUNTER — Encounter: Payer: Self-pay | Admitting: Family Medicine

## 2018-11-08 ENCOUNTER — Other Ambulatory Visit: Payer: Self-pay

## 2018-11-08 ENCOUNTER — Ambulatory Visit: Payer: No Typology Code available for payment source | Attending: Family Medicine

## 2018-11-08 ENCOUNTER — Other Ambulatory Visit: Payer: Self-pay | Admitting: Nurse Practitioner

## 2018-11-08 DIAGNOSIS — E538 Deficiency of other specified B group vitamins: Secondary | ICD-10-CM

## 2018-11-08 DIAGNOSIS — R7303 Prediabetes: Secondary | ICD-10-CM

## 2018-11-08 DIAGNOSIS — Z7251 High risk heterosexual behavior: Secondary | ICD-10-CM

## 2018-11-08 DIAGNOSIS — E559 Vitamin D deficiency, unspecified: Secondary | ICD-10-CM

## 2018-11-09 ENCOUNTER — Other Ambulatory Visit: Payer: Self-pay | Admitting: Nurse Practitioner

## 2018-11-09 ENCOUNTER — Other Ambulatory Visit (HOSPITAL_COMMUNITY)
Admission: RE | Admit: 2018-11-09 | Discharge: 2018-11-09 | Disposition: A | Discharge status: Home / Self Care | Payer: No Typology Code available for payment source | Source: Ambulatory Visit | Attending: Nurse Practitioner | Admitting: Nurse Practitioner

## 2018-11-09 ENCOUNTER — Other Ambulatory Visit: Payer: Self-pay

## 2018-11-09 ENCOUNTER — Encounter: Payer: Self-pay | Admitting: Nurse Practitioner

## 2018-11-09 DIAGNOSIS — Z7251 High risk heterosexual behavior: Secondary | ICD-10-CM

## 2018-11-09 LAB — CMP14+EGFR
ALT: 8 IU/L (ref 0–32)
AST: 13 IU/L (ref 0–40)
Albumin/Globulin Ratio: 1.1 — ABNORMAL LOW (ref 1.2–2.2)
Albumin: 3.8 g/dL — ABNORMAL LOW (ref 3.9–5.0)
Alkaline Phosphatase: 75 IU/L (ref 39–117)
BUN/Creatinine Ratio: 11 (ref 9–23)
BUN: 8 mg/dL (ref 6–20)
Bilirubin Total: <0.2 mg/dL (ref 0.0–1.2)
CO2: 23 mmol/L (ref 20–29)
Calcium: 9.3 mg/dL (ref 8.7–10.2)
Chloride: 103 mmol/L (ref 96–106)
Creatinine, Ser: 0.72 mg/dL (ref 0.57–1.00)
GFR calc Af Amer: 132 mL/min/{1.73_m2} (ref >59)
GFR calc non Af Amer: 114 mL/min/{1.73_m2} (ref >59)
Globulin, Total: 3.4 g/dL (ref 1.5–4.5)
Glucose: 81 mg/dL (ref 65–99)
Potassium: 4.4 mmol/L (ref 3.5–5.2)
Sodium: 140 mmol/L (ref 134–144)
Total Protein: 7.2 g/dL (ref 6.0–8.5)

## 2018-11-09 LAB — CBC
Hematocrit: 37 % (ref 34.0–46.6)
Hemoglobin: 11.8 g/dL (ref 11.1–15.9)
MCH: 26.5 pg — ABNORMAL LOW (ref 26.6–33.0)
MCHC: 31.9 g/dL (ref 31.5–35.7)
MCV: 83 fL (ref 79–97)
Platelets: 319 10*3/uL (ref 150–450)
RBC: 4.45 x10E6/uL (ref 3.77–5.28)
RDW: 14.6 % (ref 11.7–15.4)
WBC: 8 10*3/uL (ref 3.4–10.8)

## 2018-11-09 LAB — HEMOGLOBIN A1C
Est. average glucose Bld gHb Est-mCnc: 108 mg/dL
Hgb A1c MFr Bld: 5.4 % (ref 4.8–5.6)

## 2018-11-09 LAB — HIV ANTIBODY (ROUTINE TESTING W REFLEX): HIV Screen 4th Generation wRfx: NONREACTIVE

## 2018-11-09 LAB — RPR: RPR Ser Ql: NONREACTIVE

## 2018-11-09 LAB — B12 AND FOLATE PANEL
Folate: 5.6 ng/mL (ref >3.0)
Vitamin B-12: 541 pg/mL (ref 232–1245)

## 2018-11-09 LAB — VITAMIN D 25 HYDROXY (VIT D DEFICIENCY, FRACTURES): Vit D, 25-Hydroxy: 15.9 ng/mL — ABNORMAL LOW (ref 30.0–100.0)

## 2018-11-09 MED ORDER — VITAMIN D (ERGOCALCIFEROL) 1.25 MG (50000 UNIT) PO CAPS: 50000.0000 [IU] | ORAL_CAPSULE | ORAL | 1 refills | Status: DC

## 2018-11-10 LAB — URINE CYTOLOGY ANCILLARY ONLY
Chlamydia: NEGATIVE
Neisseria Gonorrhea: NEGATIVE
Trichomonas: NEGATIVE

## 2018-11-12 ENCOUNTER — Other Ambulatory Visit: Payer: Self-pay | Admitting: Nurse Practitioner

## 2018-11-12 LAB — URINE CYTOLOGY ANCILLARY ONLY
Bacterial vaginitis: POSITIVE — AB
Candida vaginitis: NEGATIVE

## 2018-11-12 MED ORDER — METRONIDAZOLE 500 MG PO TABS: 500.0000 mg | ORAL_TABLET | Freq: Two times a day (BID) | ORAL | 0 refills | Status: AC

## 2018-11-17 ENCOUNTER — Telehealth: Payer: No Typology Code available for payment source | Admitting: Family Medicine

## 2018-11-17 ENCOUNTER — Telehealth: Payer: No Typology Code available for payment source | Admitting: Nurse Practitioner

## 2018-11-17 NOTE — Progress Notes (Signed)
erroneous test My chart virtual visit

## 2018-11-23 ENCOUNTER — Telehealth: Payer: No Typology Code available for payment source | Admitting: Family Medicine

## 2018-12-01 ENCOUNTER — Ambulatory Visit: Payer: No Typology Code available for payment source | Admitting: Skilled Nursing Facility1

## 2018-12-10 ENCOUNTER — Other Ambulatory Visit: Payer: Self-pay | Admitting: Nurse Practitioner

## 2018-12-10 ENCOUNTER — Encounter: Payer: No Typology Code available for payment source | Admitting: Nurse Practitioner

## 2018-12-10 MED ORDER — PENICILLIN V POTASSIUM 500 MG PO TABS: 500.0000 mg | ORAL_TABLET | Freq: Four times a day (QID) | ORAL | 0 refills | Status: AC

## 2018-12-12 NOTE — Progress Notes (Signed)
This encounter was created in error - please disregard.

## 2018-12-16 ENCOUNTER — Ambulatory Visit: Payer: No Typology Code available for payment source | Admitting: Registered"

## 2018-12-22 ENCOUNTER — Encounter: Payer: Self-pay | Admitting: Nurse Practitioner

## 2018-12-22 ENCOUNTER — Telehealth: Payer: No Typology Code available for payment source | Admitting: Family Medicine

## 2018-12-23 ENCOUNTER — Telehealth: Payer: No Typology Code available for payment source

## 2018-12-23 ENCOUNTER — Telehealth: Payer: No Typology Code available for payment source | Admitting: Family Medicine

## 2018-12-23 ENCOUNTER — Telehealth: Payer: No Typology Code available for payment source | Admitting: Internal Medicine

## 2018-12-24 ENCOUNTER — Encounter: Payer: No Typology Code available for payment source | Admitting: Nurse Practitioner

## 2018-12-24 NOTE — Progress Notes (Signed)
Erroneous encounter

## 2018-12-28 ENCOUNTER — Telehealth: Payer: No Typology Code available for payment source | Admitting: Primary Care

## 2018-12-29 ENCOUNTER — Telehealth: Payer: No Typology Code available for payment source | Admitting: Family Medicine

## 2018-12-30 ENCOUNTER — Telehealth: Payer: No Typology Code available for payment source | Admitting: Family Medicine

## 2018-12-30 ENCOUNTER — Telehealth: Payer: No Typology Code available for payment source | Admitting: Internal Medicine

## 2018-12-30 ENCOUNTER — Telehealth: Payer: Self-pay | Admitting: *Deleted

## 2019-01-04 ENCOUNTER — Ambulatory Visit: Payer: No Typology Code available for payment source | Attending: Nurse Practitioner

## 2019-01-04 ENCOUNTER — Other Ambulatory Visit: Payer: Self-pay

## 2019-01-04 ENCOUNTER — Other Ambulatory Visit: Payer: Self-pay | Admitting: Nurse Practitioner

## 2019-01-05 LAB — CMP14+EGFR
ALT: 9 IU/L (ref 0–32)
AST: 14 IU/L (ref 0–40)
Albumin/Globulin Ratio: 1.2 (ref 1.2–2.2)
Albumin: 4 g/dL (ref 3.9–5.0)
Alkaline Phosphatase: 76 IU/L (ref 39–117)
BUN/Creatinine Ratio: 13 (ref 9–23)
BUN: 10 mg/dL (ref 6–20)
Bilirubin Total: <0.2 mg/dL (ref 0.0–1.2)
CO2: 24 mmol/L (ref 20–29)
Calcium: 9.1 mg/dL (ref 8.7–10.2)
Chloride: 104 mmol/L (ref 96–106)
Creatinine, Ser: 0.79 mg/dL (ref 0.57–1.00)
GFR calc Af Amer: 118 mL/min/{1.73_m2} (ref >59)
GFR calc non Af Amer: 102 mL/min/{1.73_m2} (ref >59)
Globulin, Total: 3.3 g/dL (ref 1.5–4.5)
Glucose: 74 mg/dL (ref 65–99)
Potassium: 4.5 mmol/L (ref 3.5–5.2)
Sodium: 139 mmol/L (ref 134–144)
Total Protein: 7.3 g/dL (ref 6.0–8.5)

## 2019-01-05 LAB — TSH: TSH: 0.898 u[IU]/mL (ref 0.450–4.500)

## 2019-01-06 NOTE — Telephone Encounter (Signed)
This was a test

## 2019-01-26 ENCOUNTER — Ambulatory Visit: Payer: Self-pay | Admitting: Surgery

## 2019-01-26 NOTE — H&P (Signed)
Emily NumberQuianna M Ukonu [facility] Patient #: [id number] DOB: 01/03/1991 Divorced / Language: English / Race: Black or African American Female History of Present Illness The patient is a 28 year old AA female who presents with morbid obesity and BMI 56.  Marland Kitchen. Patient is referred by Dr Helane RimaErica Wallace for consideration for surgical treatment for morbid obesity. The patient gives a history of progressive obesity since childhood despite multiple attempts at medical management. She has been through many attempts at nonsurgical weight loss over the years and able to lose only a modest amount of weight and then experiences progressive weight regain. Obesity has been affecting the patient in a Frank of ways including difficulty with daily activities due to fatigue and joint pain in her lower extremities.. She has more recently developed polycystic ovarian syndrome followed by Dr. Leroy LibmanKelly Davis and is on metformin. The patient is concerned about her long-term health and her fertility as she would like to have a family 1 day.. The patient has been to our initial information seminar, researched surgical options thoroughly, and is interested in whatever procedure we feel would be best in her situation.  She was seen by Dr Johna SheriffHoxworth in 2019 and by me in July 2019 and is ready to proceed with roux en Y gastric bypass.    Past Surgical History  No pertinent past surgical history   Diagnostic Studies History  Colonoscopy  never Pap Smear  never  Allergies  No Known Allergies   Allergies Reconciled   Medication History ALPRAZolam (1MG  Tablet, Oral) Active. Phentermine HCl (37.5MG  Tablet, Oral) Active. Amoxicillin (875MG  Tablet, Oral) Active. Ciprofloxacin HCl (500MG  Tablet, Oral) Active. Cyanocobalamin (1000MCG/ML Solution, Injection) Active. Diclofenac Sodium (75MG  Tablet DR, Oral) Active. MetFORMIN HCl (1000MG  Tablet, Oral) Active. Ondansetron (4MG  Tablet Disint, Oral)  Active. Promethazine HCl (25MG  Tablet, Oral) Active. Trulicity (1.5MG /0.5ML Soln Pen-inj, Subcutaneous) Active. Flonase (50MCG/DOSE Inhaler, Nasal) Active.  Social History Alcohol use  Occasional alcohol use. Caffeine use  Carbonated beverages, Coffee, Tea. Illicit drug use  Prefer to discuss with provider. Tobacco use  Never smoker.  Family History Alcohol Abuse  Father. Diabetes Mellitus  Mother, Sister. Heart Disease  Father, Mother, Sister. Heart disease in female family member before age 29  Hypertension  Father, Mother. Kidney Disease  Father, Mother. Thyroid problems  Mother.  Pregnancy / Birth History  Age at menarche  12 years. Gravida  1 Irregular periods  Maternal age  29-20 Para  0  Other Problems  Other disease, cancer, significant illness     Review of Systems  General Not Present- Appetite Loss, Chills, Fatigue, Fever, Night Sweats, Weight Gain and Weight Loss. Skin Not Present- Change in Wart/Mole, Dryness, Hives, Jaundice, New Lesions, Non-Healing Wounds, Rash and Ulcer. HEENT Present- Earache, Hearing Loss and Seasonal Allergies. Not Present- Hoarseness, Nose Bleed, Oral Ulcers, Ringing in the Ears, Sinus Pain, Sore Throat, Visual Disturbances, Wears glasses/contact lenses and Yellow Eyes. Respiratory Present- Snoring. Not Present- Bloody sputum, Chronic Cough, Difficulty Breathing and Wheezing. Breast Not Present- Breast Mass, Breast Pain, Nipple Discharge and Skin Changes. Cardiovascular Not Present- Chest Pain, Difficulty Breathing Lying Down, Leg Cramps, Palpitations, Rapid Heart Rate, Shortness of Breath and Swelling of Extremities. Gastrointestinal Not Present- Abdominal Pain, Bloating, Bloody Stool, Change in Bowel Habits, Chronic diarrhea, Constipation, Difficulty Swallowing, Excessive gas, Gets full quickly at meals, Hemorrhoids, Indigestion, Nausea, Rectal Pain and Vomiting. Female Genitourinary Not Present- Frequency, Nocturia,  Painful Urination, Pelvic Pain and Urgency. Musculoskeletal Not Present- Back Pain, Joint Pain, Joint  Stiffness, Muscle Pain, Muscle Weakness and Swelling of Extremities. Neurological Not Present- Decreased Memory, Fainting, Headaches, Numbness, Seizures, Tingling, Tremor, Trouble walking and Weakness. Psychiatric Present- Anxiety and Frequent crying. Not Present- Bipolar, Change in Sleep Pattern, Depression and Fearful. Endocrine Not Present- Cold Intolerance, Excessive Hunger, Hair Changes, Heat Intolerance, Hot flashes and New Diabetes. Hematology Not Present- Blood Thinners, Easy Bruising, Excessive bleeding, Gland problems, HIV and Persistent Infections.  Vitals  Weight: 326 lb Height: 64in Body Surface Area: 2.42 m Body Mass Index: 56.06 kg/m  Temp.: 97.72F (Temporal)  BP: 122/86(Sitting, Left Arm, Standard)    Physical Exam  The physical exam findings are as follows: Note: General: Alert, morbidly obese African-American female, in no distress Skin: Warm and dry without rash or infection. HEENT: No palpable masses or thyromegaly. Sclera nonicteric. Pupils equal round and reactive. Oropharynx clear. Lymph nodes: No cervical, supraclavicular, or inguinal nodes palpable. Lungs: Breath sounds clear and equal. No wheezing or increased work of breathing. Cardiovascular: Regular rate and rhythm without murmer. No JVD or edema. Peripheral pulses intact. No carotid bruits. Abdomen: Nondistended. Soft and nontender. No masses palpable. No organomegaly. No palpable hernias. Extremities: No edema or joint swelling or deformity. No chronic venous stasis changes. Neurologic: Alert and fully oriented. Gait normal. No focal weakness. Psychiatric: Normal mood and affect. Thought content appropriate with normal judgement and insight    Assessment & Plan   OBESITY, MORBID, BMI 50 OR HIGHER (E66.01).  We have discussed lap roux en Y gastric bypass and she is ready to proceed.  Informed  consent obtained.  I have assumed her care from Dr. Excell Seltzer.    Matt B. Hassell Done, MD, FACS

## 2019-01-29 ENCOUNTER — Emergency Department (HOSPITAL_BASED_OUTPATIENT_CLINIC_OR_DEPARTMENT_OTHER)
Admission: EM | Admit: 2019-01-29 | Discharge: 2019-01-29 | Disposition: A | Discharge status: Home / Self Care | Payer: No Typology Code available for payment source | Attending: Emergency Medicine | Admitting: Emergency Medicine

## 2019-01-29 ENCOUNTER — Other Ambulatory Visit: Payer: Self-pay

## 2019-01-29 ENCOUNTER — Encounter (HOSPITAL_BASED_OUTPATIENT_CLINIC_OR_DEPARTMENT_OTHER): Payer: Self-pay | Admitting: Emergency Medicine

## 2019-01-29 DIAGNOSIS — K5641 Fecal impaction: Secondary | ICD-10-CM | POA: Insufficient documentation

## 2019-01-29 DIAGNOSIS — K5900 Constipation, unspecified: Secondary | ICD-10-CM | POA: Diagnosis present

## 2019-01-29 MED ORDER — POLYETHYLENE GLYCOL 3350 17 G PO PACK: 17.0000 g | PACK | Freq: Four times a day (QID) | ORAL | 0 refills | Status: DC | PRN

## 2019-01-29 MED ORDER — BISACODYL 10 MG RE SUPP: 10.0000 mg | RECTAL | 0 refills | Status: DC | PRN

## 2019-01-29 NOTE — ED Triage Notes (Signed)
Constipation and rectal pain x 1 week. She has been on a liquid diet since Wednesday to prepare for upcoming gastric bypass. Reports she noticed bright red blood when she wiped today.

## 2019-01-29 NOTE — Discharge Instructions (Addendum)
You were evaluated in the Emergency Department and after careful evaluation, we did not find any emergent condition requiring admission or further testing in the hospital.  Your symptoms today seem to be due to a fecal impaction.  We attempted to remove the stool and we think that we were able to loosen it from the bowel wall.  We recommend enema treatment at home to continue to try and pass the stool.  We also provided you with some prescriptions to soften your stool.  Please discuss your liquid diet with your surgeon.  Please return to the Emergency Department if you experience any worsening of your condition.  We encourage you to follow up with a primary care provider.  Thank you for allowing [place] to be a part of your care.

## 2019-01-29 NOTE — ED Notes (Signed)
MD at bedside for rectal exam with Yazleemar Strassner RN to chaperone

## 2019-01-29 NOTE — ED Notes (Signed)
Pt given Rx x 2 for miralax and suppositoires

## 2019-01-29 NOTE — ED Notes (Signed)
ED Provider at bedside. 

## 2019-01-29 NOTE — ED Provider Notes (Signed)
[facility] Emergency Department Provider Note MRN:  [id number]  Arrival date & time: 01/29/19     Chief Complaint   Constipation   History of Present Illness   Emily Frank is a 28 y.o. year-old female with a history of obesity presenting to the ED with chief complaint of constipation.  Patient explains that she recently transition to a full liquid diet, which was recommended to her by her surgeons.  She is scheduled for a gastric bypass soon.  Since that time she has experienced constipation, no bowel movements for several days.  Feels like there is a large stool ball in her rectum that she cannot pass.  Try to remove the stool ball with her hands today and noted some bleeding and is here for evaluation.  Denies abdominal pain, no fever.  Review of Systems  A complete 10 system review of systems was obtained and all systems are negative except as noted in the HPI and PMH.   Patient's Health History    Past Medical History:  Diagnosis Date  . Depression   . H/O chlamydia infection   . H/O gonorrhea   . Obesity   . Polycystic ovarian syndrome     History reviewed. No pertinent surgical history.  Family History  Problem Relation Age of Onset  . Hypertension Mother   . Diabetes Mother   . Hepatitis C Mother   . Renal Disease Mother   . Hypertension Father   . Hepatitis C Father   . Breast cancer Maternal Grandmother   . Brain cancer Maternal Grandmother   . Bone cancer Maternal Grandmother   . Hypertension Maternal Grandmother   . Renal Disease Maternal Grandmother   . Diabetes Maternal Grandmother   . Colon cancer Maternal Grandfather   . Breast cancer Paternal Grandmother   . Hypertension Paternal Grandmother     Social History   Socioeconomic History  . Marital status: Legally Separated    Spouse name: Not on file  . Number of children: Not on file  . Years of education: Not on file  . Highest education level: Not on file   Occupational History    Employer: [employer]  Social Needs  . Financial resource strain: Not on file  . Food insecurity    Worry: Not on file    Inability: Not on file  . Transportation needs    Medical: Not on file    Non-medical: Not on file  Tobacco Use  . Smoking status: Never Smoker  . Smokeless tobacco: Never Used  Substance and Sexual Activity  . Alcohol use: Yes    Comment: Occasional   . Drug use: No  . Sexual activity: Not Currently    Partners: Male    Birth control/protection: None  Lifestyle  . Physical activity    Days per week: Not on file    Minutes per session: Not on file  . Stress: Not on file  Relationships  . Social Musicianconnections    Talks on phone: Not on file    Gets together: Not on file    Attends religious service: Not on file    Active member of club or organization: Not on file    Attends meetings of clubs or organizations: Not on file    Relationship status: Not on file  . Intimate partner violence    Fear of current or ex partner: Not on file    Emotionally abused: Not on file  Physically abused: Not on file    Forced sexual activity: Not on file  Other Topics Concern  . Not on file  Social History Narrative  . Not on file     Physical Exam  Vital Signs and Nursing Notes reviewed Vitals:   01/29/19 1402  BP: 131/89  Pulse: 87  Resp: 18  Temp: 98.4 F (36.9 C)  SpO2: 98%    CONSTITUTIONAL: Well-appearing, NAD NEURO:  Alert and oriented x 3, no focal deficits EYES:  eyes equal and reactive ENT/NECK:  no LAD, no JVD CARDIO: Regular rate, well-perfused, normal S1 and S2 PULM:  CTAB no wheezing or rhonchi GI/GU:  normal bowel sounds, non-distended, non-tender; small nontender hemostatic external hemorrhoid, small hemostatic anal fissure, multiple small solid stool balls in the rectal vault MSK/SPINE:  No gross deformities, no edema SKIN:  no rash, atraumatic PSYCH:  Appropriate speech and behavior   Diagnostic and Interventional Summary    Labs Reviewed - No data to display  No orders to display    Medications - No data to display   Fecal disimpaction  Date/Time: 01/29/2019 3:07 PM Performed by: Maudie Flakes, MD Authorized by: Maudie Flakes, MD  Consent: Verbal consent obtained. Consent given by: patient Patient understanding: patient states understanding of the procedure being performed Patient tolerance: patient tolerated the procedure well with no immediate complications Comments: Multiple small solid stool balls present in the rectal vault, not distal enough to remove but able to separate from the wall of the rectum.    Critical Care  ED Course and Medical Decision Making  I have reviewed the triage vital signs and the nursing notes.  Pertinent labs & imaging results that were available during my care of the patient were reviewed by me and considered in my medical decision making (see below for details).  History and exam consistent with fecal impaction, disimpaction attempted as described above.  Patient is with normal vital signs, benign abdomen, appropriate for continued home management with enema, stool softeners.  Rectal bleeding likely explained by either hemorrhoid or anal fissure, nothing to suggest more significant cause of bleeding.  After the discussed management above, the patient was determined to be safe for discharge.  The patient was in agreement with this plan and all questions regarding their care were answered.  ED return precautions were discussed and the patient will return to the ED with any significant worsening of condition.  [name], [place] [email or web address]  Final Clinical Impressions(s) / ED Diagnoses     ICD-10-CM   1. Fecal impaction in rectum [facility])  K56.41     ED Discharge Orders         Ordered    bisacodyl (DULCOLAX) 10 MG suppository  As needed     01/29/19 1503     polyethylene glycol (MIRALAX) 17 g packet  4 times daily PRN     01/29/19 1503             Maudie Flakes, MD 01/29/19 [phone]

## 2019-02-28 ENCOUNTER — Telehealth: Payer: Self-pay | Admitting: Skilled Nursing Facility1

## 2019-02-28 NOTE — Telephone Encounter (Signed)
Opened in error

## 2019-03-02 NOTE — Patient Instructions (Addendum)
DUE TO COVID-19 ONLY ONE VISITOR IS ALLOWED TO COME WITH YOU AND STAY IN THE WAITING ROOM ONLY DURING PRE OP AND PROCEDURE. THE ONE VISITOR MAY VISIT WITH YOU IN YOUR PRIVATE ROOM DURING VISITOR HOURS ONLY!!   COVID SWAB TESTING MUST BE COMPLETED ON: Friday, Aug. 21, 2020 at 12:05PM    [address], [place] [facility] enter pre surgical testing line (Must self quarantine after testing. Follow instructions on handout.)             Your procedure is scheduled on: Tuesday, Aug. 25, 2020   Report [facility] Main  Entrance   Report to Short Stay at 5:30 AM   Call this number if you have problems the morning of surgery [phone]   NO SOLID FOOD AFTER 600 PM THE NIGHT BEFORE YOUR SURGERY.   YOU MAY DRINK CLEAR FLUIDS.   CLEAR LIQUID DIET  Foods Allowed                                                                     Foods Excluded  Water, Black Coffee and tea, regular and decaf                             liquids that you cannot  Plain Jell-O in any flavor  (No red)                                           see through such as: Fruit ices (not with fruit pulp)                                     milk, soups, orange juice  Iced Popsicles (No red)                                    All solid food Carbonated beverages, regular and diet                                    Apple juices Sports drinks like Gatorade (No red) Lightly seasoned clear broth or consume(fat free) Sugar, honey syrup  Sample Menu Breakfast                                Lunch                                     Supper Cranberry juice                    Beef broth                            Chicken broth  Jell-O                                     Grape juice                           Apple juice Coffee or tea                        Jell-O                                      Popsicle                                                Coffee or tea                        Coffee  or tea   Complete one G2 drink  BEFORE YOU LEAVE HOME    Brush your teeth the morning of surgery.   Do NOT smoke after Midnight   Take these medicines the morning of surgery with A SIP OF WATER: NONE              You may not have any metal on your body including hair pins, jewelry, and body piercings             Do not wear make-up, lotions, powders, perfumes/cologne, or deodorant             Do not wear nail polish.  Do not shave  48 hours prior to surgery.                Do not bring valuables to the hospital. [place].   Contacts, dentures or bridgework may not be worn into surgery.   Bring small overnight bag day of surgery.    Special Instructions: Bring a copy of your healthcare power of attorney and living will documents         the day of surgery if you haven't scanned them in before.              Please read over the following fact sheets you were given:  [facility] - Preparing for Surgery Before surgery, you can play an important role.  Because skin is not sterile, your skin needs to be as free of germs as possible.  You can reduce the number of germs on your skin by washing with CHG (chlorahexidine gluconate) soap before surgery.  CHG is an antiseptic cleaner which kills germs and bonds with the skin to continue killing germs even after washing. Please DO NOT use if you have an allergy to CHG or antibacterial soaps.  If your skin becomes reddened/irritated stop using the CHG and inform your nurse when you arrive at Short Stay. Do not shave (including legs and underarms) for at least 48 hours prior to the first CHG shower.  You may shave your face/neck.  Please follow these instructions carefully:  1.  Shower with CHG Soap the night before  surgery and the  morning of surgery.  2.  If you choose to wash your hair, wash your hair first as usual with your normal  shampoo.  3.  After you shampoo, rinse your hair and body  thoroughly to remove the shampoo.                             4.  Use CHG as you would any other liquid soap.  You can apply chg directly to the skin and wash.  Gently with a scrungie or clean washcloth.  5.  Apply the CHG Soap to your body ONLY FROM THE NECK DOWN.   Do   not use on face/ open                           Wound or open sores. Avoid contact with eyes, ears mouth and   genitals (private parts).                       Wash face,  Genitals (private parts) with your normal soap.             6.  Wash thoroughly, paying special attention to the area where your    surgery  will be performed.  7.  Thoroughly rinse your body with warm water from the neck down.  8.  DO NOT shower/wash with your normal soap after using and rinsing off the CHG Soap.                9.  Pat yourself dry with a clean towel.            10.  Wear clean pajamas.            11.  Place clean sheets on your bed the night of your first shower and do not  sleep with pets. Day of Surgery : Do not apply any lotions/deodorants the morning of surgery.  Please wear clean clothes to the hospital/surgery center.  FAILURE TO FOLLOW THESE INSTRUCTIONS MAY RESULT IN THE CANCELLATION OF YOUR SURGERY  PATIENT SIGNATURE_________________________________  NURSE SIGNATURE__________________________________  ________________________________________________________________________

## 2019-03-03 ENCOUNTER — Encounter (HOSPITAL_COMMUNITY): Payer: Self-pay

## 2019-03-03 ENCOUNTER — Encounter (HOSPITAL_COMMUNITY)
Admission: RE | Admit: 2019-03-03 | Discharge: 2019-03-03 | Disposition: A | Discharge status: Home / Self Care | Payer: No Typology Code available for payment source | Source: Ambulatory Visit | Attending: Surgery | Admitting: Surgery

## 2019-03-03 ENCOUNTER — Ambulatory Visit (HOSPITAL_COMMUNITY)
Admission: RE | Admit: 2019-03-03 | Discharge: 2019-03-03 | Disposition: A | Discharge status: Home / Self Care | Payer: No Typology Code available for payment source | Source: Ambulatory Visit | Attending: Anesthesiology | Admitting: Anesthesiology

## 2019-03-03 ENCOUNTER — Other Ambulatory Visit: Payer: Self-pay

## 2019-03-03 DIAGNOSIS — Z01818 Encounter for other preprocedural examination: Secondary | ICD-10-CM

## 2019-03-03 HISTORY — DX: Adult sexual abuse, confirmed, initial encounter: T74.21XA

## 2019-03-03 LAB — COMPREHENSIVE METABOLIC PANEL
ALT: 15 U/L (ref 0–44)
AST: 18 U/L (ref 15–41)
Albumin: 3.7 g/dL (ref 3.5–5.0)
Alkaline Phosphatase: 72 U/L (ref 38–126)
Anion gap: 8 (ref 5–15)
BUN: 10 mg/dL (ref 6–20)
CO2: 25 mmol/L (ref 22–32)
Calcium: 9.2 mg/dL (ref 8.9–10.3)
Chloride: 105 mmol/L (ref 98–111)
Creatinine, Ser: 0.7 mg/dL (ref 0.44–1.00)
GFR calc Af Amer: >60 mL/min (ref >60)
GFR calc non Af Amer: >60 mL/min (ref >60)
Glucose, Bld: 96 mg/dL (ref 70–99)
Potassium: 4.5 mmol/L (ref 3.5–5.1)
Sodium: 138 mmol/L (ref 135–145)
Total Bilirubin: 0.4 mg/dL (ref 0.3–1.2)
Total Protein: 8.3 g/dL — ABNORMAL HIGH (ref 6.5–8.1)

## 2019-03-03 LAB — CBC WITH DIFFERENTIAL/PLATELET
Abs Immature Granulocytes: 0.01 10*3/uL (ref 0.00–0.07)
Basophils Absolute: 0 10*3/uL (ref 0.0–0.1)
Basophils Relative: 0 %
Eosinophils Absolute: 0.1 10*3/uL (ref 0.0–0.5)
Eosinophils Relative: 1 %
HCT: 40.5 % (ref 36.0–46.0)
Hemoglobin: 12.3 g/dL (ref 12.0–15.0)
Immature Granulocytes: 0 %
Lymphocytes Relative: 32 %
Lymphs Abs: 2.2 10*3/uL (ref 0.7–4.0)
MCH: 26 pg (ref 26.0–34.0)
MCHC: 30.4 g/dL (ref 30.0–36.0)
MCV: 85.6 fL (ref 80.0–100.0)
Monocytes Absolute: 0.6 10*3/uL (ref 0.1–1.0)
Monocytes Relative: 9 %
Neutro Abs: 3.9 10*3/uL (ref 1.7–7.7)
Neutrophils Relative %: 58 %
Platelets: 318 10*3/uL (ref 150–400)
RBC: 4.73 MIL/uL (ref 3.87–5.11)
RDW: 14.4 % (ref 11.5–15.5)
WBC: 6.8 10*3/uL (ref 4.0–10.5)
nRBC: 0 % (ref 0.0–0.2)

## 2019-03-03 LAB — ABO/RH: ABO/RH(D): O POS

## 2019-03-03 NOTE — Progress Notes (Signed)
SPOKE W/  [place]     SCREENING SYMPTOMS OF COVID 19:   COUGH--NO  RUNNY NOSE--- NO  SORE THROAT---NO  NASAL CONGESTION----NO  SNEEZING----NO  SHORTNESS OF BREATH---NO  DIFFICULTY BREATHING---NO  TEMP >100.0 -----NO  UNEXPLAINED BODY ACHES------NO  CHILLS -------- NO  HEADACHES ---------NO  LOSS OF SMELL/ TASTE --------NO    HAVE YOU OR ANY FAMILY MEMBER TRAVELLED PAST 14 DAYS OUT OF THE   COUNTY---lives in [facility] STATE----NO COUNTRY----NO  HAVE YOU OR ANY FAMILY MEMBER BEEN EXPOSED TO ANYONE WITH COVID 19? NO

## 2019-03-04 ENCOUNTER — Other Ambulatory Visit (HOSPITAL_COMMUNITY): Payer: No Typology Code available for payment source

## 2019-03-04 ENCOUNTER — Encounter: Payer: No Typology Code available for payment source | Attending: Surgery | Admitting: Dietician

## 2019-03-04 ENCOUNTER — Other Ambulatory Visit (HOSPITAL_COMMUNITY)
Admission: RE | Admit: 2019-03-04 | Discharge: 2019-03-04 | Disposition: A | Discharge status: Home / Self Care | Payer: No Typology Code available for payment source | Source: Ambulatory Visit | Attending: Surgery | Admitting: Surgery

## 2019-03-04 DIAGNOSIS — Z6841 Body Mass Index (BMI) 40.0 and over, adult: Secondary | ICD-10-CM | POA: Diagnosis not present

## 2019-03-04 DIAGNOSIS — Z713 Dietary counseling and surveillance: Secondary | ICD-10-CM | POA: Insufficient documentation

## 2019-03-04 DIAGNOSIS — Z01812 Encounter for preprocedural laboratory examination: Secondary | ICD-10-CM | POA: Insufficient documentation

## 2019-03-04 DIAGNOSIS — Z20828 Contact with and (suspected) exposure to other viral communicable diseases: Secondary | ICD-10-CM | POA: Insufficient documentation

## 2019-03-04 LAB — SARS CORONAVIRUS 2 (TAT 6-24 HRS): SARS Coronavirus 2: NEGATIVE

## 2019-03-04 NOTE — Progress Notes (Signed)
Pre-Operative Nutrition Class:  Appt start time: 0900  End time:  1100.  Patient was seen on 03/04/19 for Pre-Operative Bariatric Surgery Education at the [facility] at [facility].   Surgery date: 03/07/19 Surgery type: RYGB Start weight at [facility]: 326.5lbs Weight today: 319.5lbs  InBody  BODY COMP RESULTS    BMI (kg/m^2) 54.8  Fat Mass (lbs) 174.7  Fat Free Mass (lbs) 82.5  Total Body Water (lbs) 107.1   Samples given per MNT protocol. Patient educated on appropriate usage: Celebrate multivitamin Lot # [id number], Exp: 05/2020;  Lot# [id number], Exp: 05/2020   Celebrate Vitamins Calcium Citrate Lot # [id number], Exp: 03/2020;  Ot# [id number], exp: 12/2019; Lot# [id number], exp: 06/2019; Lot# [id number], exp: 01/2020; Lot# [id number], exp: 02/2020  Unjury Protein Shake Lot# [id number], Exp: 07/03/2019  Renee Pain Protein Powder Lot # [id number]  Exp: 40.9735; Lot# [id number], Exp: 12/2019; Lot# [id number], Exp: 12/2019  Protein 2O drink Lot#[id number] CCP 2683, Exp: 02/2020   The following the learning objectives were met by the patient during this course:  Identify Pre-Op Dietary Goals and will begin 2 weeks pre-operatively  Identify appropriate sources of fluids and proteins   State protein recommendations and appropriate sources pre and post-operatively  Identify Post-Operative Dietary Goals and will follow for 2 weeks post-operatively  Identify appropriate multivitamin and calcium sources  Describe the need for physical activity post-operatively and will follow MD recommendations  State when to call healthcare provider regarding medication questions or post-operative complications  Handouts given during class include:  Pre-Op Bariatric Surgery Diet Handout  Protein Shake Handout  Post-Op Bariatric Surgery Nutrition Handout  BELT Program Information Flyer  Support Group Information Flyer  [facility] Bariatric Supplements Price List  Follow-Up Plan: Patient will  follow-up at [place], at about 2 weeks post operatively for diet advancement per MD.

## 2019-03-07 ENCOUNTER — Encounter (HOSPITAL_COMMUNITY): Payer: Self-pay | Admitting: Certified Registered Nurse Anesthetist
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
## 2019-03-07 NOTE — Anesthesia Preprocedure Evaluation (Addendum)
Anesthesia Evaluation  Patient identified by MRN, date of birth, ID band Patient awake    Reviewed: Allergy & Precautions, NPO status , Patient's Chart, lab work & pertinent test results  History of Anesthesia Complications Negative for: history of anesthetic complications  Airway Mallampati: I  TM Distance: >3 FB Neck ROM: Full    Dental  (+) Teeth Intact   Pulmonary neg pulmonary ROS,    Pulmonary exam normal        Cardiovascular negative cardio ROS Normal cardiovascular exam     Neuro/Psych PSYCHIATRIC DISORDERS Anxiety Depression negative neurological ROS     GI/Hepatic negative GI ROS, Neg liver ROS,   Endo/Other  Morbid obesity  Renal/GU negative Renal ROS  negative genitourinary   Musculoskeletal negative musculoskeletal ROS (+)   Abdominal (+) + obese,   Peds  Hematology negative hematology ROS (+)   Anesthesia Other Findings   Reproductive/Obstetrics                            Anesthesia Physical Anesthesia Plan  ASA: III  Anesthesia Plan: General   Post-op Pain Management:    Induction: Intravenous  PONV Risk Score and Plan: 4 or greater and Ondansetron, Dexamethasone, Treatment may vary due to age or medical condition and Midazolam  Airway Management Planned: Oral ETT  Additional Equipment: None  Intra-op Plan:   Post-operative Plan: Extubation in OR  Informed Consent: I have reviewed the patients History and Physical, chart, labs and discussed the procedure including the risks, benefits and alternatives for the proposed anesthesia with the patient or authorized representative who has indicated his/her understanding and acceptance.     Dental advisory given  Plan Discussed with:   Anesthesia Plan Comments:        Anesthesia Quick Evaluation

## 2019-03-08 ENCOUNTER — Encounter (HOSPITAL_COMMUNITY)
Admission: RE | Disposition: A | Discharge status: Home / Self Care | Payer: Self-pay | Source: Home / Self Care | Attending: Surgery

## 2019-03-08 ENCOUNTER — Inpatient Hospital Stay (HOSPITAL_COMMUNITY)
Admission: RE | Admit: 2019-03-08 | Discharge: 2019-03-11 | DRG: 621 | Disposition: A | Discharge status: Home / Self Care | Payer: No Typology Code available for payment source | Attending: Surgery | Admitting: Surgery

## 2019-03-08 ENCOUNTER — Other Ambulatory Visit: Payer: Self-pay

## 2019-03-08 ENCOUNTER — Inpatient Hospital Stay (HOSPITAL_COMMUNITY): Payer: No Typology Code available for payment source | Admitting: Physician Assistant

## 2019-03-08 ENCOUNTER — Encounter (HOSPITAL_COMMUNITY): Payer: Self-pay

## 2019-03-08 ENCOUNTER — Inpatient Hospital Stay (HOSPITAL_COMMUNITY): Payer: No Typology Code available for payment source | Admitting: Certified Registered Nurse Anesthetist

## 2019-03-08 DIAGNOSIS — E282 Polycystic ovarian syndrome: Secondary | ICD-10-CM | POA: Diagnosis present

## 2019-03-08 DIAGNOSIS — Z800 Family history of malignant neoplasm of digestive organs: Secondary | ICD-10-CM | POA: Diagnosis not present

## 2019-03-08 DIAGNOSIS — Z833 Family history of diabetes mellitus: Secondary | ICD-10-CM | POA: Diagnosis not present

## 2019-03-08 DIAGNOSIS — R110 Nausea: Secondary | ICD-10-CM | POA: Diagnosis not present

## 2019-03-08 DIAGNOSIS — Z803 Family history of malignant neoplasm of breast: Secondary | ICD-10-CM

## 2019-03-08 DIAGNOSIS — Z841 Family history of disorders of kidney and ureter: Secondary | ICD-10-CM

## 2019-03-08 DIAGNOSIS — Z6841 Body Mass Index (BMI) 40.0 and over, adult: Secondary | ICD-10-CM

## 2019-03-08 DIAGNOSIS — Z8249 Family history of ischemic heart disease and other diseases of the circulatory system: Secondary | ICD-10-CM | POA: Diagnosis not present

## 2019-03-08 DIAGNOSIS — Z79899 Other long term (current) drug therapy: Secondary | ICD-10-CM

## 2019-03-08 DIAGNOSIS — Z7984 Long term (current) use of oral hypoglycemic drugs: Secondary | ICD-10-CM

## 2019-03-08 DIAGNOSIS — Z808 Family history of malignant neoplasm of other organs or systems: Secondary | ICD-10-CM | POA: Diagnosis not present

## 2019-03-08 DIAGNOSIS — Z9884 Bariatric surgery status: Secondary | ICD-10-CM

## 2019-03-08 DIAGNOSIS — R52 Pain, unspecified: Secondary | ICD-10-CM

## 2019-03-08 HISTORY — PX: GASTRIC ROUX-EN-Y: SHX5262

## 2019-03-08 LAB — CREATININE, SERUM
Creatinine, Ser: 0.78 mg/dL (ref 0.44–1.00)
GFR calc Af Amer: >60 mL/min (ref >60)
GFR calc non Af Amer: >60 mL/min (ref >60)

## 2019-03-08 LAB — CBC
HCT: 42 % (ref 36.0–46.0)
Hemoglobin: 12.8 g/dL (ref 12.0–15.0)
MCH: 25.9 pg — ABNORMAL LOW (ref 26.0–34.0)
MCHC: 30.5 g/dL (ref 30.0–36.0)
MCV: 85 fL (ref 80.0–100.0)
Platelets: 306 10*3/uL (ref 150–400)
RBC: 4.94 MIL/uL (ref 3.87–5.11)
RDW: 14.4 % (ref 11.5–15.5)
WBC: 14.3 10*3/uL — ABNORMAL HIGH (ref 4.0–10.5)
nRBC: 0 % (ref 0.0–0.2)

## 2019-03-08 LAB — TYPE AND SCREEN
ABO/RH(D): O POS
Antibody Screen: NEGATIVE

## 2019-03-08 LAB — PREGNANCY, URINE: Preg Test, Ur: NEGATIVE

## 2019-03-08 LAB — GLUCOSE, CAPILLARY: Glucose-Capillary: 93 mg/dL (ref 70–99)

## 2019-03-08 SURGERY — LAPAROSCOPIC ROUX-EN-Y GASTRIC BYPASS WITH UPPER ENDOSCOPY
Anesthesia: General | Site: Abdomen

## 2019-03-08 MED ORDER — TISSEEL VH 10 ML EX KIT
PACK | CUTANEOUS | Status: AC
  Filled 2019-03-08: qty 1

## 2019-03-08 MED ORDER — OXYCODONE HCL 5 MG PO TABS: 5.0000 mg | ORAL_TABLET | Freq: Once | ORAL | Status: DC | PRN

## 2019-03-08 MED ORDER — ONDANSETRON HCL 4 MG/2ML IJ SOLN
4.0000 mg | INTRAMUSCULAR | Status: DC | PRN
  Administered 2019-03-08 – 2019-03-09 (×3): 4 mg via INTRAVENOUS
  Filled 2019-03-08 (×3): qty 2

## 2019-03-08 MED ORDER — LACTATED RINGERS IV SOLN
INTRAVENOUS | Status: DC
  Administered 2019-03-08 (×2): via INTRAVENOUS

## 2019-03-08 MED ORDER — APREPITANT 40 MG PO CAPS
40.0000 mg | ORAL_CAPSULE | ORAL | Status: AC
  Administered 2019-03-08: 06:00:00 40 mg via ORAL
  Filled 2019-03-08: qty 1

## 2019-03-08 MED ORDER — LIDOCAINE 2% (20 MG/ML) 5 ML SYRINGE
INTRAMUSCULAR | Status: DC | PRN
  Administered 2019-03-08: 60 mg via INTRAVENOUS

## 2019-03-08 MED ORDER — LIDOCAINE 20MG/ML (2%) 15 ML SYRINGE OPTIME
INTRAMUSCULAR | Status: DC | PRN
  Administered 2019-03-08: 1.5 mg/kg/h via INTRAVENOUS

## 2019-03-08 MED ORDER — FENTANYL CITRATE (PF) 100 MCG/2ML IJ SOLN
25.0000 ug | INTRAMUSCULAR | Status: DC | PRN
  Administered 2019-03-08 (×3): 50 ug via INTRAVENOUS

## 2019-03-08 MED ORDER — CHLORHEXIDINE GLUCONATE CLOTH 2 % EX PADS: 6.0000 | MEDICATED_PAD | Freq: Once | CUTANEOUS | Status: DC

## 2019-03-08 MED ORDER — HEPARIN SODIUM (PORCINE) 5000 UNIT/ML IJ SOLN
5000.0000 [IU] | Freq: Three times a day (TID) | INTRAMUSCULAR | Status: DC
  Administered 2019-03-08 – 2019-03-11 (×10): 5000 [IU] via SUBCUTANEOUS
  Filled 2019-03-08 (×8): qty 1

## 2019-03-08 MED ORDER — SUCCINYLCHOLINE CHLORIDE 200 MG/10ML IV SOSY
PREFILLED_SYRINGE | INTRAVENOUS | Status: DC | PRN
  Administered 2019-03-08: 140 mg via INTRAVENOUS

## 2019-03-08 MED ORDER — ONDANSETRON HCL 4 MG/2ML IJ SOLN
INTRAMUSCULAR | Status: AC
  Filled 2019-03-08: qty 2

## 2019-03-08 MED ORDER — ONDANSETRON HCL 4 MG/2ML IJ SOLN: 4.0000 mg | Freq: Once | INTRAMUSCULAR | Status: DC | PRN

## 2019-03-08 MED ORDER — BUPIVACAINE LIPOSOME 1.3 % IJ SUSP
20.0000 mL | Freq: Once | INTRAMUSCULAR | Status: AC
  Administered 2019-03-08: 11:00:00 20 mL
  Filled 2019-03-08: qty 20

## 2019-03-08 MED ORDER — ONDANSETRON HCL 4 MG/2ML IJ SOLN
INTRAMUSCULAR | Status: DC | PRN
  Administered 2019-03-08: 4 mg via INTRAVENOUS

## 2019-03-08 MED ORDER — OXYCODONE HCL 5 MG/5ML PO SOLN
5.0000 mg | Freq: Four times a day (QID) | ORAL | Status: DC | PRN
  Administered 2019-03-10: 5 mg via ORAL
  Filled 2019-03-08 (×2): qty 5

## 2019-03-08 MED ORDER — PROPOFOL 10 MG/ML IV BOLUS
INTRAVENOUS | Status: DC | PRN
  Administered 2019-03-08: 200 mg via INTRAVENOUS

## 2019-03-08 MED ORDER — FENTANYL CITRATE (PF) 250 MCG/5ML IJ SOLN
INTRAMUSCULAR | Status: AC
  Filled 2019-03-08: qty 5

## 2019-03-08 MED ORDER — SODIUM CHLORIDE 0.9 % IV SOLN
2.0000 g | INTRAVENOUS | Status: AC
  Administered 2019-03-08: 2 g via INTRAVENOUS
  Filled 2019-03-08: qty 2

## 2019-03-08 MED ORDER — FENTANYL CITRATE (PF) 100 MCG/2ML IJ SOLN
INTRAMUSCULAR | Status: DC | PRN
  Administered 2019-03-08: 50 ug via INTRAVENOUS
  Administered 2019-03-08: 100 ug via INTRAVENOUS
  Administered 2019-03-08 (×2): 50 ug via INTRAVENOUS
  Administered 2019-03-08: 100 ug via INTRAVENOUS
  Administered 2019-03-08 (×2): 50 ug via INTRAVENOUS

## 2019-03-08 MED ORDER — PANTOPRAZOLE SODIUM 40 MG IV SOLR
40.0000 mg | Freq: Every day | INTRAVENOUS | Status: DC
  Administered 2019-03-08 – 2019-03-10 (×3): 40 mg via INTRAVENOUS
  Filled 2019-03-08 (×3): qty 40

## 2019-03-08 MED ORDER — SODIUM CHLORIDE (PF) 0.9 % IJ SOLN
INTRAMUSCULAR | Status: AC
  Filled 2019-03-08: qty 10

## 2019-03-08 MED ORDER — SUGAMMADEX SODIUM 500 MG/5ML IV SOLN
INTRAVENOUS | Status: AC
  Filled 2019-03-08: qty 5

## 2019-03-08 MED ORDER — DEXAMETHASONE SODIUM PHOSPHATE 10 MG/ML IJ SOLN
INTRAMUSCULAR | Status: AC
  Filled 2019-03-08: qty 1

## 2019-03-08 MED ORDER — KETAMINE HCL 10 MG/ML IJ SOLN
INTRAMUSCULAR | Status: DC | PRN
  Administered 2019-03-08: 10 mg via INTRAVENOUS
  Administered 2019-03-08: 30 mg via INTRAVENOUS

## 2019-03-08 MED ORDER — DEXAMETHASONE SODIUM PHOSPHATE 4 MG/ML IJ SOLN
INTRAMUSCULAR | Status: DC | PRN
  Administered 2019-03-08: 10 mg via INTRAVENOUS

## 2019-03-08 MED ORDER — MIDAZOLAM HCL 5 MG/5ML IJ SOLN
INTRAMUSCULAR | Status: DC | PRN
  Administered 2019-03-08: 2 mg via INTRAVENOUS

## 2019-03-08 MED ORDER — SODIUM CHLORIDE (PF) 0.9 % IJ SOLN
INTRAMUSCULAR | Status: DC | PRN
  Administered 2019-03-08: 10 mL

## 2019-03-08 MED ORDER — FENTANYL CITRATE (PF) 100 MCG/2ML IJ SOLN
INTRAMUSCULAR | Status: AC
  Filled 2019-03-08: qty 2

## 2019-03-08 MED ORDER — HEPARIN SODIUM (PORCINE) 5000 UNIT/ML IJ SOLN
5000.0000 [IU] | INTRAMUSCULAR | Status: AC
  Administered 2019-03-08: 06:00:00 5000 [IU] via SUBCUTANEOUS
  Filled 2019-03-08: qty 1

## 2019-03-08 MED ORDER — GABAPENTIN 300 MG PO CAPS
300.0000 mg | ORAL_CAPSULE | ORAL | Status: AC
  Administered 2019-03-08: 300 mg via ORAL
  Filled 2019-03-08: qty 1

## 2019-03-08 MED ORDER — KETAMINE HCL 10 MG/ML IJ SOLN
INTRAMUSCULAR | Status: AC
  Filled 2019-03-08: qty 1

## 2019-03-08 MED ORDER — HYDRALAZINE HCL 20 MG/ML IJ SOLN: 10.0000 mg | INTRAMUSCULAR | Status: DC | PRN

## 2019-03-08 MED ORDER — GLYCOPYRROLATE PF 0.2 MG/ML IJ SOSY
PREFILLED_SYRINGE | INTRAMUSCULAR | Status: AC
  Filled 2019-03-08: qty 1

## 2019-03-08 MED ORDER — LIDOCAINE 2% (20 MG/ML) 5 ML SYRINGE
INTRAMUSCULAR | Status: AC
  Filled 2019-03-08: qty 5

## 2019-03-08 MED ORDER — OXYCODONE HCL 5 MG/5ML PO SOLN: 5.0000 mg | Freq: Once | ORAL | Status: DC | PRN

## 2019-03-08 MED ORDER — SUCCINYLCHOLINE CHLORIDE 200 MG/10ML IV SOSY
PREFILLED_SYRINGE | INTRAVENOUS | Status: AC
  Filled 2019-03-08: qty 10

## 2019-03-08 MED ORDER — ACETAMINOPHEN 500 MG PO TABS
1000.0000 mg | ORAL_TABLET | Freq: Three times a day (TID) | ORAL | Status: DC
  Filled 2019-03-08: qty 2

## 2019-03-08 MED ORDER — KCL IN DEXTROSE-NACL 20-5-0.45 MEQ/L-%-% IV SOLN
INTRAVENOUS | Status: DC
  Administered 2019-03-08 – 2019-03-10 (×5): via INTRAVENOUS
  Filled 2019-03-08 (×7): qty 1000

## 2019-03-08 MED ORDER — LIDOCAINE HCL 2 % IJ SOLN
INTRAMUSCULAR | Status: AC
  Filled 2019-03-08: qty 20

## 2019-03-08 MED ORDER — ROCURONIUM BROMIDE 10 MG/ML (PF) SYRINGE
PREFILLED_SYRINGE | INTRAVENOUS | Status: AC
  Filled 2019-03-08: qty 10

## 2019-03-08 MED ORDER — ROCURONIUM BROMIDE 10 MG/ML (PF) SYRINGE
PREFILLED_SYRINGE | INTRAVENOUS | Status: DC | PRN
  Administered 2019-03-08: 10 mg via INTRAVENOUS
  Administered 2019-03-08: 70 mg via INTRAVENOUS
  Administered 2019-03-08: 20 mg via INTRAVENOUS
  Administered 2019-03-08 (×2): 10 mg via INTRAVENOUS
  Administered 2019-03-08: 20 mg via INTRAVENOUS

## 2019-03-08 MED ORDER — LACTATED RINGERS IR SOLN
Status: DC | PRN
  Administered 2019-03-08: 1000 mL

## 2019-03-08 MED ORDER — ACETAMINOPHEN 500 MG PO TABS
1000.0000 mg | ORAL_TABLET | ORAL | Status: AC
  Administered 2019-03-08: 1000 mg via ORAL
  Filled 2019-03-08: qty 2

## 2019-03-08 MED ORDER — PROPOFOL 10 MG/ML IV BOLUS
INTRAVENOUS | Status: AC
  Filled 2019-03-08: qty 40

## 2019-03-08 MED ORDER — 0.9 % SODIUM CHLORIDE (POUR BTL) OPTIME
TOPICAL | Status: DC | PRN
  Administered 2019-03-08: 09:00:00 1000 mL

## 2019-03-08 MED ORDER — ENSURE MAX PROTEIN PO LIQD
2.0000 [oz_av] | ORAL | Status: DC
  Administered 2019-03-10 (×7): 2 [oz_av] via ORAL

## 2019-03-08 MED ORDER — MORPHINE SULFATE (PF) 4 MG/ML IV SOLN
1.0000 mg | INTRAVENOUS | Status: DC | PRN
  Administered 2019-03-08 – 2019-03-09 (×2): 3 mg via INTRAVENOUS
  Administered 2019-03-09: 01:00:00 2 mg via INTRAVENOUS
  Administered 2019-03-09: 11:00:00 3 mg via INTRAVENOUS
  Administered 2019-03-10: 02:00:00 2 mg via INTRAVENOUS
  Filled 2019-03-08 (×5): qty 1

## 2019-03-08 MED ORDER — ACETAMINOPHEN 160 MG/5ML PO SOLN
1000.0000 mg | Freq: Three times a day (TID) | ORAL | Status: DC
  Administered 2019-03-09 – 2019-03-11 (×2): 1000 mg via ORAL
  Filled 2019-03-08 (×3): qty 40.6

## 2019-03-08 MED ORDER — SUGAMMADEX SODIUM 500 MG/5ML IV SOLN
INTRAVENOUS | Status: DC | PRN
  Administered 2019-03-08: 400 mg via INTRAVENOUS

## 2019-03-08 MED ORDER — MIDAZOLAM HCL 2 MG/2ML IJ SOLN
INTRAMUSCULAR | Status: AC
  Filled 2019-03-08: qty 2

## 2019-03-08 SURGICAL SUPPLY — 70 items
APPLICATOR COTTON TIP 6 STRL (MISCELLANEOUS) ×2 IMPLANT
APPLICATOR COTTON TIP 6IN STRL (MISCELLANEOUS) ×4
APPLIER CLIP ROT 10 11.4 M/L (STAPLE)
APPLIER CLIP ROT 13.4 12 LRG (CLIP)
BENZOIN TINCTURE PRP APPL 2/3 (GAUZE/BANDAGES/DRESSINGS) IMPLANT
BLADE SURG 15 STRL LF DISP TIS (BLADE) ×1 IMPLANT
BLADE SURG 15 STRL SS (BLADE) ×1
CABLE HIGH FREQUENCY MONO STRZ (ELECTRODE) IMPLANT
CLIP APPLIE ROT 10 11.4 M/L (STAPLE) IMPLANT
CLIP APPLIE ROT 13.4 12 LRG (CLIP) IMPLANT
CLIP SUT LAPRA TY ABSORB (SUTURE) ×2 IMPLANT
COVER WAND RF STERILE (DRAPES) IMPLANT
DERMABOND ADVANCED (GAUZE/BANDAGES/DRESSINGS) ×1
DERMABOND ADVANCED .7 DNX12 (GAUZE/BANDAGES/DRESSINGS) ×1 IMPLANT
DEVICE SUT QUICK LOAD TK 5 (STAPLE) IMPLANT
DEVICE SUT TI-KNOT TK 5X26 (MISCELLANEOUS) IMPLANT
DEVICE SUTURE ENDOST 10MM (ENDOMECHANICALS) ×2 IMPLANT
DISSECTOR BLUNT TIP ENDO 5MM (MISCELLANEOUS) IMPLANT
DRAIN PENROSE 18X1/4 LTX STRL (WOUND CARE) ×2 IMPLANT
GAUZE 4X4 16PLY RFD (DISPOSABLE) ×2 IMPLANT
GAUZE SPONGE 4X4 12PLY STRL (GAUZE/BANDAGES/DRESSINGS) IMPLANT
GLOVE BIOGEL M 8.0 STRL (GLOVE) ×2 IMPLANT
GOWN STRL REUS W/TWL XL LVL3 (GOWN DISPOSABLE) ×8 IMPLANT
HANDLE STAPLE EGIA 4 XL (STAPLE) ×2 IMPLANT
HOVERMATT SINGLE USE (MISCELLANEOUS) ×2 IMPLANT
KIT BASIN OR (CUSTOM PROCEDURE TRAY) ×2 IMPLANT
KIT GASTRIC LAVAGE 34FR ADT (SET/KITS/TRAYS/PACK) ×2 IMPLANT
KIT TURNOVER KIT A (KITS) IMPLANT
MARKER SKIN DUAL TIP RULER LAB (MISCELLANEOUS) ×2 IMPLANT
NEEDLE SPNL 22GX3.5 QUINCKE BK (NEEDLE) ×2 IMPLANT
PACK CARDIOVASCULAR III (CUSTOM PROCEDURE TRAY) ×2 IMPLANT
RELOAD EGIA 45 MED/THCK PURPLE (STAPLE) ×2 IMPLANT
RELOAD EGIA 45 TAN VASC (STAPLE) ×2 IMPLANT
RELOAD EGIA 60 MED/THCK PURPLE (STAPLE) ×2 IMPLANT
RELOAD EGIA 60 TAN VASC (STAPLE) ×2 IMPLANT
RELOAD ENDO STITCH 2.0 (ENDOMECHANICALS) ×9
RELOAD TRI 45 ART MED THCK PUR (STAPLE) IMPLANT
RELOAD TRI 60 ART MED THCK PUR (STAPLE) ×6 IMPLANT
SCISSORS LAP 5X45 EPIX DISP (ENDOMECHANICALS) ×2 IMPLANT
SEALANT SURGICAL APPL DUAL CAN (MISCELLANEOUS) ×2 IMPLANT
SET IRRIG TUBING LAPAROSCOPIC (IRRIGATION / IRRIGATOR) ×2 IMPLANT
SET TUBE SMOKE EVAC HIGH FLOW (TUBING) ×2 IMPLANT
SHEARS HARMONIC ACE PLUS 45CM (MISCELLANEOUS) ×2 IMPLANT
SLEEVE ADV FIXATION 12X100MM (TROCAR) ×4 IMPLANT
SLEEVE ADV FIXATION 5X100MM (TROCAR) IMPLANT
SOL ANTI FOG 6CC (MISCELLANEOUS) ×1 IMPLANT
SOLUTION ANTI FOG 6CC (MISCELLANEOUS) ×1
STAPLER VISISTAT 35W (STAPLE) ×2 IMPLANT
STRIP CLOSURE SKIN 1/2X4 (GAUZE/BANDAGES/DRESSINGS) IMPLANT
SUT RELOAD ENDO STITCH 2 48X1 (ENDOMECHANICALS) ×5
SUT RELOAD ENDO STITCH 2.0 (ENDOMECHANICALS) ×4
SUT SURGIDAC NAB ES-9 0 48 120 (SUTURE) IMPLANT
SUT VIC AB 2-0 SH 27 (SUTURE) ×1
SUT VIC AB 2-0 SH 27X BRD (SUTURE) ×1 IMPLANT
SUT VIC AB 4-0 SH 18 (SUTURE) ×2 IMPLANT
SUTURE RELOAD END STTCH 2 48X1 (ENDOMECHANICALS) ×5 IMPLANT
SUTURE RELOAD ENDO STITCH 2.0 (ENDOMECHANICALS) ×4 IMPLANT
SYR 10ML ECCENTRIC (SYRINGE) ×2 IMPLANT
SYR 20ML LL LF (SYRINGE) ×4 IMPLANT
SYR 50ML LL SCALE MARK (SYRINGE) ×2 IMPLANT
TOWEL OR 17X26 10 PK STRL BLUE (TOWEL DISPOSABLE) ×4 IMPLANT
TOWEL OR NON WOVEN STRL DISP B (DISPOSABLE) ×2 IMPLANT
TRAY FOLEY MTR SLVR 16FR STAT (SET/KITS/TRAYS/PACK) ×2 IMPLANT
TROCAR 12M 150ML BLUNT (TROCAR) ×8 IMPLANT
TROCAR ADV FIXATION 12X100MM (TROCAR) ×2 IMPLANT
TROCAR ADV FIXATION 5X100MM (TROCAR) ×2 IMPLANT
TROCAR BLADELESS OPT 5 100 (ENDOMECHANICALS) ×2 IMPLANT
TROCAR BLADELESS OPT 5 150 (ENDOMECHANICALS) ×2 IMPLANT
TROCAR XCEL 12X100 BLDLESS (ENDOMECHANICALS) ×2 IMPLANT
TUBING CONNECTING 10 (TUBING) ×4 IMPLANT

## 2019-03-08 NOTE — Anesthesia Postprocedure Evaluation (Signed)
Anesthesia Post Note  Patient: LYNNEL ZANETTI  Procedure(s) Performed: LAPAROSCOPIC ROUX-EN-Y GASTRIC BYPASS WITH UPPER ENDOSCOPY, ERAS Pathway (N/A Abdomen)     Patient location during evaluation: PACU Anesthesia Type: General Level of consciousness: awake and alert Pain management: pain level controlled Vital Signs Assessment: post-procedure vital signs reviewed and stable Respiratory status: spontaneous breathing, nonlabored ventilation, respiratory function stable and patient connected to nasal cannula oxygen Cardiovascular status: blood pressure returned to baseline and stable Postop Assessment: no apparent nausea or vomiting Anesthetic complications: no    Last Vitals:  Vitals:   03/08/19 1315 03/08/19 1445  BP: (!) 147/92   Pulse: (!) 104   Resp: (!) 26   Temp:  (P) 36.6 C  SpO2: 100%     Last Pain:  Vitals:   03/08/19 1400  TempSrc:   PainSc: Asleep                 Lidia Collum
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
## 2019-03-08 NOTE — Op Note (Signed)
Preoperative diagnosis: Roux-en-Y gastric bypass  Postoperative diagnosis: Same   Procedure: Upper endoscopy   Surgeon: Gurney Maxin, M.D.  Anesthesia: Gen.   Indications for procedure: This patient was undergoing a Roux-en-Y gastric bypass.   Description of procedure: The endoscopy was placed in the mouth and into the oropharynx and under endoscopic vision it was advanced to the esophagogastric junction. The pouch was insufflated and no bleeding or bubbles were seen. The GEJ was identified at 39cm from the teeth. The anastomosis was seen at 47cm from the teeth. No bleeding or leaks were detected. The scope was withdrawn without difficulty.   Gurney Maxin, M.D. General, Bariatric, & Minimally Invasive Surgery [facility]

## 2019-03-08 NOTE — Anesthesia Procedure Notes (Signed)
Procedure Name: Intubation Date/Time: 03/08/2019 7:28 AM Performed by: Claudia Desanctis, CRNA Pre-anesthesia Checklist: Patient identified, Emergency Drugs available, Suction available and Patient being monitored Patient Re-evaluated:Patient Re-evaluated prior to induction Oxygen Delivery Method: Circle system utilized Preoxygenation: Pre-oxygenation with 100% oxygen Induction Type: IV induction Ventilation: Mask ventilation without difficulty Laryngoscope Size: Mac and 3 Grade View: Grade I Tube type: Oral Tube size: 7.5 mm Number of attempts: 1 Airway Equipment and Method: Stylet Placement Confirmation: ETT inserted through vocal cords under direct vision,  positive ETCO2 and breath sounds checked- equal and bilateral Secured at: 22 cm Tube secured with: Tape Dental Injury: Teeth and Oropharynx as per pre-operative assessment

## 2019-03-08 NOTE — Interval H&P Note (Signed)
History and Physical Interval Note:  03/08/2019 7:14 AM  Emily Frank  has presented today for surgery, with the diagnosis of Morbid Obesity, PCOS, Vitamin D Deficiency, Vitamin B Deficiency.  The various methods of treatment have been discussed with the patient and family. After consideration of risks, benefits and other options for treatment, the patient has consented to  Procedure(s): LAPAROSCOPIC ROUX-EN-Y GASTRIC BYPASS WITH UPPER ENDOSCOPY, ERAS Pathway (N/A) as a surgical intervention.  The patient's history has been reviewed, patient examined, no change in status, stable for surgery.  I have reviewed the patient's chart and labs.  Questions were answered to the patient's satisfaction.     Pedro Earls

## 2019-03-08 NOTE — Transfer of Care (Signed)
Immediate Anesthesia Transfer of Care Note  Patient: Emily Frank  Procedure(s) Performed: LAPAROSCOPIC ROUX-EN-Y GASTRIC BYPASS WITH UPPER ENDOSCOPY, ERAS Pathway (N/A Abdomen)  Patient Location: PACU  Anesthesia Type:General  Level of Consciousness: drowsy and patient cooperative  Airway & Oxygen Therapy: Patient Spontanous Breathing and Patient connected to face mask  Post-op Assessment: Report given to RN and Post -op Vital signs reviewed and stable  Post vital signs: Reviewed and stable  Last Vitals:  Vitals Value Taken Time  BP 145/88 03/08/19 1132  Temp    Pulse 108 03/08/19 1134  Resp 24 03/08/19 1134  SpO2 98 % 03/08/19 1134  Vitals shown include unvalidated device data.  Last Pain:  Vitals:   03/08/19 0609  TempSrc:   PainSc: 0-No pain         Complications: No apparent anesthesia complications

## 2019-03-08 NOTE — Progress Notes (Addendum)
Discussed post op day goals with patient including ambulation, IS, diet progression, pain, and nausea control.   Provided ice pack incisional pain.   Patient medicated by bedside RN.  Questions answered.

## 2019-03-08 NOTE — Op Note (Signed)
March 08, 2019  Surgeon: Althea Grimmer. Hassell Done, MD, FACS Asst:  Gurney Maxin, MD, FACS Anesthesia: General endotracheal Drains: None  Procedure: Laparoscopic Roux en Y gastric bypass with 40 cm BP limb and 100 cm Roux limb, antecolic, antegastric, candy cane to the left.  t. Upper endoscopy by Dr. Kieth Brightly.   Description of Procedure:  The patient was taken to OR 1 at [facility] and given general anesthesia.  The abdomen was prepped with PCMX and draped sterilely.  A time out was performed.  Access was achieved with some difficulty through the left upper quadrant.  Her abdomen was not only thick but very lax.  Insufflation was performed and long 12 cm trocars were required.  Bilateral TAP block with 25 cc of the Exparel diluted to 30cc.  5 cc were placed in the [place] site.    The operation began by identifying the ligament of Treitz. I measured 40 cm downstream and divided the bowel with a 6 cm Covidian stapler.  I sutured a Penrose drain along the Roux limb end.  I measured a 1 meter (100 cm) Roux limb and then placed the distal bowels to the BP limb side by side and performed a stapled jejunojejunostomy. The common defect was closed from either end with 4-0 Vicryl using the Endo Stitch. The mesenteric defect was closed with a running 2-0 silk using the Endo Stitch. Tisseel was applied to the suture line.  The omentum was divided with the harmonic scalpel.  The Nathanson retractor was inserted in the left lateral segment of liver was retracted. The foregut dissection ensued.  She had a small intraabdominal space and this created some dueling of the instruments.  No hiatal hernia was noted.  6 cm was measured along the lessor curvature and a tubular pouch was created.  Two no pants were followed by the rest with TRS buttress to complete the pouch.    The Roux limb was then brought up with the candycane pointed left and a back row of sutures of 2-0 Vicryl were placed. I opened along the right side of each  structure and inserted the 4.5 cm stapler to create the gastrojejunostomy. The common defect was closed from either end with 2-0 Vicryl and a second row was placed anterior to that the Ewald tube acting as a stent across the anastomosis. The Penrose drain was removed.   Endoscopy was performed by Dr. Kieth Brightly.  No bubbles were noted  The incisions were closed with 4-0 Monocryl and Dermabond.    The patient was taken to the recovery room in satisfactory condition.  Matt B. Hassell Done, MD, FACS

## 2019-03-09 ENCOUNTER — Encounter (HOSPITAL_COMMUNITY): Payer: Self-pay | Admitting: Surgery

## 2019-03-09 ENCOUNTER — Inpatient Hospital Stay (HOSPITAL_COMMUNITY): Payer: No Typology Code available for payment source

## 2019-03-09 LAB — CBC WITH DIFFERENTIAL/PLATELET
Abs Immature Granulocytes: 0.07 10*3/uL (ref 0.00–0.07)
Basophils Absolute: 0 10*3/uL (ref 0.0–0.1)
Basophils Relative: 0 %
Eosinophils Absolute: 0 10*3/uL (ref 0.0–0.5)
Eosinophils Relative: 0 %
HCT: 40.3 % (ref 36.0–46.0)
Hemoglobin: 12.4 g/dL (ref 12.0–15.0)
Immature Granulocytes: 0 %
Lymphocytes Relative: 6 %
Lymphs Abs: 1 10*3/uL (ref 0.7–4.0)
MCH: 25.9 pg — ABNORMAL LOW (ref 26.0–34.0)
MCHC: 30.8 g/dL (ref 30.0–36.0)
MCV: 84.1 fL (ref 80.0–100.0)
Monocytes Absolute: 1.1 10*3/uL — ABNORMAL HIGH (ref 0.1–1.0)
Monocytes Relative: 7 %
Neutro Abs: 13.5 10*3/uL — ABNORMAL HIGH (ref 1.7–7.7)
Neutrophils Relative %: 87 %
Platelets: 312 10*3/uL (ref 150–400)
RBC: 4.79 MIL/uL (ref 3.87–5.11)
RDW: 14.5 % (ref 11.5–15.5)
WBC: 15.7 10*3/uL — ABNORMAL HIGH (ref 4.0–10.5)
nRBC: 0 % (ref 0.0–0.2)

## 2019-03-09 MED ORDER — IOHEXOL 300 MG/ML  SOLN
50.0000 mL | Freq: Once | INTRAMUSCULAR | Status: AC | PRN
  Administered 2019-03-09: 30 mL via ORAL

## 2019-03-09 MED ORDER — PROMETHAZINE HCL 25 MG/ML IJ SOLN
12.5000 mg | INTRAMUSCULAR | Status: DC | PRN
  Administered 2019-03-09 (×2): 12.5 mg via INTRAVENOUS
  Filled 2019-03-09 (×2): qty 1

## 2019-03-09 NOTE — Progress Notes (Signed)
Pt stated she vomited after trying to sip on water. Patient refuses to drink any liquids. No emesis visualized by RN. Will continue to monitor.

## 2019-03-09 NOTE — Progress Notes (Signed)
Patient ID: Emily Frank, female   DOB: 12/25/1990, 28 y.o.   MRN: [id number] [facility] Surgery Progress Note:   1 Day Post-Op  Subjective: Mental status is subdued; nausea present Objective: Vital signs in last 24 hours: Temp:  [97.5 F (36.4 C)-98.4 F (36.9 C)] 98.2 F (36.8 C) (08/26 1022) Pulse Rate:  [91-109] 91 (08/26 1022) Resp:  [14-26] 14 (08/26 1022) BP: (127-158)/(66-109) 145/84 (08/26 1022) SpO2:  [99 %-100 %] 100 % (08/26 1022)  Intake/Output from previous day: 08/25 0701 - 08/26 0700 In: 3227.4 [P.O.:60; I.V.:3067.4; IV Piggyback:100] Out: 2800 [Urine:2750; Blood:50] Intake/Output this shift: Total I/O In: 480.4 [P.O.:60; I.V.:420.4] Out: 800 [Urine:800]  Physical Exam: Work of breathing is not labored;  UGI ordered after visit this morning.    Lab Results:  Results for orders placed or performed during the hospital encounter of 03/08/19 (from the past 48 hour(s))  Pregnancy, urine STAT morning of surgery     Status: None   Collection Time: 03/08/19  5:48 AM  Result Value Ref Range   Preg Test, Ur NEGATIVE NEGATIVE    Comment:        THE SENSITIVITY OF THIS METHODOLOGY IS >20 mIU/mL. Performed at [facility], [place] [address], [place], [place] [address]   Glucose, capillary     Status: None   Collection Time: 03/08/19  6:36 AM  Result Value Ref Range   Glucose-Capillary 93 70 - 99 mg/dL  CBC     Status: Abnormal   Collection Time: 03/08/19  8:18 PM  Result Value Ref Range   WBC 14.3 (H) 4.0 - 10.5 K/uL   RBC 4.94 3.87 - 5.11 MIL/uL   Hemoglobin 12.8 12.0 - 15.0 g/dL   HCT 42.0 36.0 - 46.0 %   MCV 85.0 80.0 - 100.0 fL   MCH 25.9 (L) 26.0 - 34.0 pg   MCHC 30.5 30.0 - 36.0 g/dL   RDW 14.4 11.5 - 15.5 %   Platelets 306 150 - 400 K/uL   nRBC 0.0 0.0 - 0.2 %    Comment: Performed at [facility], [place] [address] [place], [place] [address]  Creatinine, serum     Status: None   Collection Time: 03/08/19  8:18  PM  Result Value Ref Range   Creatinine, Ser 0.78 0.44 - 1.00 mg/dL   GFR calc non Af Amer >60 >60 mL/min   GFR calc Af Amer >60 >60 mL/min    Comment: Performed at [facility], [place] [address] [place], [place] [address]  CBC WITH DIFFERENTIAL     Status: Abnormal   Collection Time: 03/09/19  3:23 AM  Result Value Ref Range   WBC 15.7 (H) 4.0 - 10.5 K/uL   RBC 4.79 3.87 - 5.11 MIL/uL   Hemoglobin 12.4 12.0 - 15.0 g/dL   HCT 40.3 36.0 - 46.0 %   MCV 84.1 80.0 - 100.0 fL   MCH 25.9 (L) 26.0 - 34.0 pg   MCHC 30.8 30.0 - 36.0 g/dL   RDW 14.5 11.5 - 15.5 %   Platelets 312 150 - 400 K/uL   nRBC 0.0 0.0 - 0.2 %   Neutrophils Relative % 87 %   Neutro Abs 13.5 (H) 1.7 - 7.7 K/uL   Lymphocytes Relative 6 %   Lymphs Abs 1.0 0.7 - 4.0 K/uL   Monocytes Relative 7 %   Monocytes Absolute 1.1 (H) 0.1 - 1.0 K/uL   Eosinophils Relative 0 %   Eosinophils  Absolute 0.0 0.0 - 0.5 K/uL   Basophils Relative 0 %   Basophils Absolute 0.0 0.0 - 0.1 K/uL   Immature Granulocytes 0 %   Abs Immature Granulocytes 0.07 0.00 - 0.07 K/uL    Comment: Performed at [facility], [address] [place], [place] [address]    Radiology/Results: Barbarann EhlersDg Ugi W Single Cm (sol Or Thin Ba)  Result Date: 03/09/2019 CLINICAL DATA:  Postop day 1 gastric bypass.  Nausea. EXAM: WATER SOLUBLE UPPER GI SERIES TECHNIQUE: Single-column upper GI series was performed using water soluble contrast. CONTRAST:  30 cc Omnipaque 300. COMPARISON:  None. FLUOROSCOPY TIME:  Fluoroscopy Time:  0 minutes 54 seconds Radiation Exposure Index (if provided by the fluoroscopic device): 61.7 mGy Number of Acquired Spot Images: 5 FINDINGS: Scout view of the abdomen shows a normal bowel gas pattern. Patient drank 30 cc Omnipaque 300. Postoperative changes of gastric bypass. Contrast flowed readily through the gastric pouch into the proximal small bowel. No leak. IMPRESSION: Expected postoperative day 1 appearance of  gastric bypass. Electronically Signed   By: Leanna BattlesMelinda  Blietz M.D.   On: 03/09/2019 11:17    Anti-infectives: Anti-infectives (From admission, onward)   Start     Dose/Rate Route Frequency Ordered Stop   03/08/19 0600  cefoTEtan (CEFOTAN) 2 g in sodium chloride 0.9 % 100 mL IVPB     2 g 200 mL/hr over 30 Minutes Intravenous On call to O.R. 03/08/19 0547 03/08/19 0748      Assessment/Plan: Problem List: Patient Active Problem List   Diagnosis Date Noted  . Gastric bypass status for obesity August 2020 03/08/2019  . MDD (major depressive disorder), recurrent episode, severe (HCC) 07/23/2018  . Panic attacks 02/23/2018  . B12 deficiency 12/13/2017  . Insulin resistance 12/13/2017  . Depression 10/23/2017  . PCOS (polycystic ovarian syndrome) 04/29/2017  . Morbid obesity (HCC) 04/29/2017    UGI showed pouch emptying OK.  Still with nausea;  Will try Phenergan.   1 Day Post-Op    LOS: 1 day   Matt B. Daphine DeutscherMartin, MD, [facility]  [facility] [phone] beeper [phone]  03/09/2019 11:51 AM

## 2019-03-09 NOTE — Progress Notes (Signed)
Patient alert and oriented, Post op day 1.  Provided support and encouragement.  Encouraged pulmonary toilet, ambulation.  Dr Hassell Done ordered UGI due to increased pain and nausea not tolerated fluids.   All questions answered.  Will continue to monitor.

## 2019-03-09 NOTE — Progress Notes (Signed)
Nutrition Note  RD consulted for diet education for patient s/p bariatric surgery. While RDs are working remotely, Bariatric nurse coordinator providing education.   If nutrition issues arise, please consult RD.   Teyton Pattillo, MS, RD, LDN  Inpatient Clinical Dietitian Pager: [phone] After Hours Pager: [phone]   

## 2019-03-10 LAB — CBC WITH DIFFERENTIAL/PLATELET
Abs Immature Granulocytes: 0.05 10*3/uL (ref 0.00–0.07)
Basophils Absolute: 0 10*3/uL (ref 0.0–0.1)
Basophils Relative: 0 %
Eosinophils Absolute: 0 10*3/uL (ref 0.0–0.5)
Eosinophils Relative: 0 %
HCT: 38.6 % (ref 36.0–46.0)
Hemoglobin: 11.7 g/dL — ABNORMAL LOW (ref 12.0–15.0)
Immature Granulocytes: 0 %
Lymphocytes Relative: 15 %
Lymphs Abs: 1.9 10*3/uL (ref 0.7–4.0)
MCH: 26.1 pg (ref 26.0–34.0)
MCHC: 30.3 g/dL (ref 30.0–36.0)
MCV: 86 fL (ref 80.0–100.0)
Monocytes Absolute: 1.1 10*3/uL — ABNORMAL HIGH (ref 0.1–1.0)
Monocytes Relative: 9 %
Neutro Abs: 9.6 10*3/uL — ABNORMAL HIGH (ref 1.7–7.7)
Neutrophils Relative %: 76 %
Platelets: 260 10*3/uL (ref 150–400)
RBC: 4.49 MIL/uL (ref 3.87–5.11)
RDW: 14.7 % (ref 11.5–15.5)
WBC: 12.6 10*3/uL — ABNORMAL HIGH (ref 4.0–10.5)
nRBC: 0 % (ref 0.0–0.2)

## 2019-03-10 MED ORDER — METOCLOPRAMIDE HCL 5 MG/ML IJ SOLN
5.0000 mg | Freq: Four times a day (QID) | INTRAMUSCULAR | Status: AC
  Administered 2019-03-10 (×2): 5 mg via INTRAVENOUS
  Filled 2019-03-10 (×2): qty 2

## 2019-03-10 MED ORDER — ENOXAPARIN (LOVENOX) PATIENT EDUCATION KIT
PACK | Freq: Once | Status: AC
  Administered 2019-03-10: 15:00:00
  Filled 2019-03-10: qty 1

## 2019-03-10 NOTE — Progress Notes (Signed)
Patient ID: Emily Frank, female   DOB: [birth year], 28 y.o.   MRN: [id number] [facility] Surgery Progress Note:   2 Days Post-Op  Subjective: Mental status is more clear.  Less pain and less nausea Objective: Vital signs in last 24 hours: Temp:  [97.8 F (36.6 C)-99.4 F (37.4 C)] 98.9 F (37.2 C) (08/27 1301) Pulse Rate:  [80-94] 80 (08/27 1301) Resp:  [16-18] 18 (08/27 1301) BP: (138-146)/(73-91) 146/81 (08/27 1301) SpO2:  [96 %-100 %] 100 % (08/27 1301)  Intake/Output from previous day: 08/26 0701 - 08/27 0700 In: 2382 [P.O.:120; I.V.:2262] Out: 2200 [Urine:2200] Intake/Output this shift: No intake/output data recorded.  Physical Exam: Work of breathing is normal.  Incisionns OK  Lab Results:  Results for orders placed or performed during the hospital encounter of 03/08/19 (from the past 48 hour(s))  CBC WITH DIFFERENTIAL     Status: Abnormal   Collection Time: 03/09/19  3:23 AM  Result Value Ref Range   WBC 15.7 (H) 4.0 - 10.5 K/uL   RBC 4.79 3.87 - 5.11 MIL/uL   Hemoglobin 12.4 12.0 - 15.0 g/dL   HCT 40.3 36.0 - 46.0 %   MCV 84.1 80.0 - 100.0 fL   MCH 25.9 (L) 26.0 - 34.0 pg   MCHC 30.8 30.0 - 36.0 g/dL   RDW 14.5 11.5 - 15.5 %   Platelets 312 150 - 400 K/uL   nRBC 0.0 0.0 - 0.2 %   Neutrophils Relative % 87 %   Neutro Abs 13.5 (H) 1.7 - 7.7 K/uL   Lymphocytes Relative 6 %   Lymphs Abs 1.0 0.7 - 4.0 K/uL   Monocytes Relative 7 %   Monocytes Absolute 1.1 (H) 0.1 - 1.0 K/uL   Eosinophils Relative 0 %   Eosinophils Absolute 0.0 0.0 - 0.5 K/uL   Basophils Relative 0 %   Basophils Absolute 0.0 0.0 - 0.1 K/uL   Immature Granulocytes 0 %   Abs Immature Granulocytes 0.07 0.00 - 0.07 K/uL    Comment: Performed at [facility], [place] [address], [place], [place] [address]  CBC with Differential     Status: Abnormal   Collection Time: 03/10/19  3:20 AM  Result Value Ref Range   WBC 12.6 (H) 4.0 - 10.5 K/uL   RBC 4.49 3.87 - 5.11 MIL/uL    Hemoglobin 11.7 (L) 12.0 - 15.0 g/dL   HCT 38.6 36.0 - 46.0 %   MCV 86.0 80.0 - 100.0 fL   MCH 26.1 26.0 - 34.0 pg   MCHC 30.3 30.0 - 36.0 g/dL   RDW 14.7 11.5 - 15.5 %   Platelets 260 150 - 400 K/uL   nRBC 0.0 0.0 - 0.2 %   Neutrophils Relative % 76 %   Neutro Abs 9.6 (H) 1.7 - 7.7 K/uL   Lymphocytes Relative 15 %   Lymphs Abs 1.9 0.7 - 4.0 K/uL   Monocytes Relative 9 %   Monocytes Absolute 1.1 (H) 0.1 - 1.0 K/uL   Eosinophils Relative 0 %   Eosinophils Absolute 0.0 0.0 - 0.5 K/uL   Basophils Relative 0 %   Basophils Absolute 0.0 0.0 - 0.1 K/uL   Immature Granulocytes 0 %   Abs Immature Granulocytes 0.05 0.00 - 0.07 K/uL    Comment: Performed at [facility], [place] [address] [place], [place] [address]    Radiology/Results: Frederik Schmidt Single Cm (sol Or Thin Ba)  Result Date: 03/09/2019 CLINICAL DATA:  Postop day 1  gastric bypass.  Nausea. EXAM: WATER SOLUBLE UPPER GI SERIES TECHNIQUE: Single-column upper GI series was performed using water soluble contrast. CONTRAST:  30 cc Omnipaque 300. COMPARISON:  None. FLUOROSCOPY TIME:  Fluoroscopy Time:  0 minutes 54 seconds Radiation Exposure Index (if provided by the fluoroscopic device): 61.7 mGy Number of Acquired Spot Images: 5 FINDINGS: Scout view of the abdomen shows a normal bowel gas pattern. Patient drank 30 cc Omnipaque 300. Postoperative changes of gastric bypass. Contrast flowed readily through the gastric pouch into the proximal small bowel. No leak. IMPRESSION: Expected postoperative day 1 appearance of gastric bypass. Electronically Signed   By: Leanna BattlesMelinda  Blietz M.D.   On: 03/09/2019 11:17    Anti-infectives: Anti-infectives (From admission, onward)   Start     Dose/Rate Route Frequency Ordered Stop   03/08/19 0600  cefoTEtan (CEFOTAN) 2 g in sodium chloride 0.9 % 100 mL IVPB     2 g 200 mL/hr over 30 Minutes Intravenous On call to O.R. 03/08/19 0547 03/08/19 0748      Assessment/Plan: Problem  List: Patient Active Problem List   Diagnosis Date Noted  . Gastric bypass status for obesity August 2020 03/08/2019  . MDD (major depressive disorder), recurrent episode, severe (HCC) 07/23/2018  . Panic attacks 02/23/2018  . B12 deficiency 12/13/2017  . Insulin resistance 12/13/2017  . Depression 10/23/2017  . PCOS (polycystic ovarian syndrome) 04/29/2017  . Morbid obesity (HCC) 04/29/2017    Because of her nausea, Reglan was ordered and as of this evening she was taking the chicken soup shake.  Will reassess in the AM 2 Days Post-Op    LOS: 2 days   Matt B. Daphine DeutscherMartin, MD, [facility]  [facility] [phone] beeper [phone]  03/10/2019 9:04 PM

## 2019-03-10 NOTE — Progress Notes (Signed)
Patient ID: Emily Frank, female   DOB: 09/16/1990, 28 y.o.   MRN: [id number] [facility] Progress Note:   2 Days Post-Op  Subjective: Mental status is more alert and less nauseated Objective: Vital signs in last 24 hours: Temp:  [97.8 F (36.6 C)-99.4 F (37.4 C)] 97.8 F (36.6 C) (08/27 0854) Pulse Rate:  [88-94] 88 (08/27 0854) Resp:  [16-18] 18 (08/27 0854) BP: (138-147)/(73-93) 138/91 (08/27 0854) SpO2:  [96 %-100 %] 97 % (08/27 0854)  Intake/Output from previous day: 08/26 0701 - 08/27 0700 In: 2382 [P.O.:120; I.V.:2262] Out: 2200 [Urine:2200] Intake/Output this shift: Total I/O In: 30 [P.O.:30] Out: 0   Physical Exam: Work of breathing is normal.  Less incisional pain  Lab Results:  Results for orders placed or performed during the hospital encounter of 03/08/19 (from the past 48 hour(s))  CBC     Status: Abnormal   Collection Time: 03/08/19  8:18 PM  Result Value Ref Range   WBC 14.3 (H) 4.0 - 10.5 K/uL   RBC 4.94 3.87 - 5.11 MIL/uL   Hemoglobin 12.8 12.0 - 15.0 g/dL   HCT 78.242.0 95.636.0 - 21.346.0 %   MCV 85.0 80.0 - 100.0 fL   MCH 25.9 (L) 26.0 - 34.0 pg   MCHC 30.5 30.0 - 36.0 g/dL   RDW 08.614.4 57.811.5 - 46.915.5 %   Platelets 306 150 - 400 K/uL   nRBC 0.0 0.0 - 0.2 %    Comment: Performed at [facility], [address] [place], [place] [address]  Creatinine, serum     Status: None   Collection Time: 03/08/19  8:18 PM  Result Value Ref Range   Creatinine, Ser 0.78 0.44 - 1.00 mg/dL   GFR calc non Af Amer >60 >60 mL/min   GFR calc Af Amer >60 >60 mL/min    Comment: Performed at [facility], [address] [place], [place] [address]  CBC WITH DIFFERENTIAL     Status: Abnormal   Collection Time: 03/09/19  3:23 AM  Result Value Ref Range   WBC 15.7 (H) 4.0 - 10.5 K/uL   RBC 4.79 3.87 - 5.11 MIL/uL   Hemoglobin 12.4 12.0 - 15.0 g/dL   HCT 44.040.3 10.236.0 - 72.546.0 %   MCV 84.1 80.0 - 100.0 fL   MCH 25.9 (L) 26.0 - 34.0 pg   MCHC 30.8 30.0 - 36.0 g/dL   RDW 36.614.5 44.011.5 - 34.715.5 %   Platelets 312 150 - 400 K/uL   nRBC 0.0 0.0 - 0.2 %   Neutrophils Relative % 87 %   Neutro Abs 13.5 (H) 1.7 - 7.7 K/uL   Lymphocytes Relative 6 %   Lymphs Abs 1.0 0.7 - 4.0 K/uL   Monocytes Relative 7 %   Monocytes Absolute 1.1 (H) 0.1 - 1.0 K/uL   Eosinophils Relative 0 %   Eosinophils Absolute 0.0 0.0 - 0.5 K/uL   Basophils Relative 0 %   Basophils Absolute 0.0 0.0 - 0.1 K/uL   Immature Granulocytes 0 %   Abs Immature Granulocytes 0.07 0.00 - 0.07 K/uL    Comment: Performed at [facility], [address] [place], [place] [address]  CBC with Differential     Status: Abnormal   Collection Time: 03/10/19  3:20 AM  Result Value Ref Range   WBC 12.6 (H) 4.0 - 10.5 K/uL   RBC 4.49 3.87 - 5.11 MIL/uL   Hemoglobin 11.7 (L) 12.0 - 15.0 g/dL  HCT 38.6 36.0 - 46.0 %   MCV 86.0 80.0 - 100.0 fL   MCH 26.1 26.0 - 34.0 pg   MCHC 30.3 30.0 - 36.0 g/dL   RDW 14.7 11.5 - 15.5 %   Platelets 260 150 - 400 K/uL   nRBC 0.0 0.0 - 0.2 %   Neutrophils Relative % 76 %   Neutro Abs 9.6 (H) 1.7 - 7.7 K/uL   Lymphocytes Relative 15 %   Lymphs Abs 1.9 0.7 - 4.0 K/uL   Monocytes Relative 9 %   Monocytes Absolute 1.1 (H) 0.1 - 1.0 K/uL   Eosinophils Relative 0 %   Eosinophils Absolute 0.0 0.0 - 0.5 K/uL   Basophils Relative 0 %   Basophils Absolute 0.0 0.0 - 0.1 K/uL   Immature Granulocytes 0 %   Abs Immature Granulocytes 0.05 0.00 - 0.07 K/uL    Comment: Performed at [facility], [place] [address] [place], [place] [address]    Radiology/Results: Frederik Schmidt Single Cm (sol Or Thin Ba)  Result Date: 03/09/2019 CLINICAL DATA:  Postop day 1 gastric bypass.  Nausea. EXAM: WATER SOLUBLE UPPER GI SERIES TECHNIQUE: Single-column upper GI series was performed using water soluble contrast. CONTRAST:  30 cc Omnipaque 300. COMPARISON:  None. FLUOROSCOPY TIME:  Fluoroscopy Time:  0 minutes 54 seconds Radiation Exposure  Index (if provided by the fluoroscopic device): 61.7 mGy Number of Acquired Spot Images: 5 FINDINGS: Scout view of the abdomen shows a normal bowel gas pattern. Patient drank 30 cc Omnipaque 300. Postoperative changes of gastric bypass. Contrast flowed readily through the gastric pouch into the proximal small bowel. No leak. IMPRESSION: Expected postoperative day 1 appearance of gastric bypass. Electronically Signed   By: Lorin Picket M.D.   On: 03/09/2019 11:17    Anti-infectives: Anti-infectives (From admission, onward)   Start     Dose/Rate Route Frequency Ordered Stop   03/08/19 0600  cefoTEtan (CEFOTAN) 2 g in sodium chloride 0.9 % 100 mL IVPB     2 g 200 mL/hr over 30 Minutes Intravenous On call to O.R. 03/08/19 0547 03/08/19 0748      Assessment/Plan: Problem List: Patient Active Problem List   Diagnosis Date Noted  . Gastric bypass status for obesity August 2020 03/08/2019  . MDD (major depressive disorder), recurrent episode, severe ([place]) 07/23/2018  . Panic attacks 02/23/2018  . B12 deficiency 12/13/2017  . Insulin resistance 12/13/2017  . Depression 10/23/2017  . PCOS (polycystic ovarian syndrome) 04/29/2017  . Morbid obesity ([place]) 04/29/2017    Beginning broth this morning.  Will try Reglan to improve motility.   2 Days Post-Op    LOS: 2 days   Matt B. Hassell Done, MD, [facility] [phone] beeper [phone]  03/10/2019 12:00 PM

## 2019-03-10 NOTE — Progress Notes (Signed)
Patient alert and oriented, Post op day 2.  Provided support and encouragement.  Encouraged pulmonary toilet, ambulation and small sips of liquids. Nausea better pain minimal per patient.  Started on chicken soup unjury.   All questions answered.  Will continue to monitor.

## 2019-03-11 LAB — CBC WITH DIFFERENTIAL/PLATELET
Abs Immature Granulocytes: 0.03 10*3/uL (ref 0.00–0.07)
Basophils Absolute: 0 10*3/uL (ref 0.0–0.1)
Basophils Relative: 0 %
Eosinophils Absolute: 0 10*3/uL (ref 0.0–0.5)
Eosinophils Relative: 0 %
HCT: 36.5 % (ref 36.0–46.0)
Hemoglobin: 11 g/dL — ABNORMAL LOW (ref 12.0–15.0)
Immature Granulocytes: 0 %
Lymphocytes Relative: 25 %
Lymphs Abs: 2.7 10*3/uL (ref 0.7–4.0)
MCH: 25.8 pg — ABNORMAL LOW (ref 26.0–34.0)
MCHC: 30.1 g/dL (ref 30.0–36.0)
MCV: 85.5 fL (ref 80.0–100.0)
Monocytes Absolute: 1 10*3/uL (ref 0.1–1.0)
Monocytes Relative: 9 %
Neutro Abs: 7 10*3/uL (ref 1.7–7.7)
Neutrophils Relative %: 66 %
Platelets: 246 10*3/uL (ref 150–400)
RBC: 4.27 MIL/uL (ref 3.87–5.11)
RDW: 14.5 % (ref 11.5–15.5)
WBC: 10.8 10*3/uL — ABNORMAL HIGH (ref 4.0–10.5)
nRBC: 0 % (ref 0.0–0.2)

## 2019-03-11 MED ORDER — OXYCODONE HCL 5 MG PO TABS: 5.0000 mg | ORAL_TABLET | Freq: Four times a day (QID) | ORAL | 0 refills | Status: DC | PRN

## 2019-03-11 MED ORDER — ONDANSETRON 4 MG PO TBDP: 4.0000 mg | ORAL_TABLET | Freq: Four times a day (QID) | ORAL | 0 refills | Status: DC | PRN

## 2019-03-11 MED ORDER — PANTOPRAZOLE SODIUM 40 MG PO TBEC: 40.0000 mg | DELAYED_RELEASE_TABLET | Freq: Every day | ORAL | 0 refills | Status: DC

## 2019-03-11 MED ORDER — ENOXAPARIN SODIUM 40 MG/0.4ML ~~LOC~~ SOLN: 40.0000 mg | Freq: Two times a day (BID) | SUBCUTANEOUS | 0 refills | Status: DC

## 2019-03-11 MED ORDER — BISACODYL 10 MG RE SUPP
10.0000 mg | Freq: Once | RECTAL | Status: AC
  Administered 2019-03-11: 13:00:00 10 mg via RECTAL
  Filled 2019-03-11: qty 1

## 2019-03-11 NOTE — Progress Notes (Signed)
Discharge instructions discussed with patient, verbalized agreement and understanding 

## 2019-03-11 NOTE — Progress Notes (Signed)
Lovenox teaching kit provided to patient. Educated patient and partner on proper technique to administer medication, s/s to call, and when to take medication.  Questions answered.
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
## 2019-03-11 NOTE — Discharge Instructions (Signed)
Enoxaparin injection What is this medicine? ENOXAPARIN (ee nox a PA rin) is used after knee, hip, or abdominal surgeries to prevent blood clotting. It is also used to treat existing blood clots in the lungs or in the veins. This medicine may be used for other purposes; ask your health care provider or pharmacist if you have questions. COMMON BRAND NAME(S): Lovenox What should I tell my health care provider before I take this medicine? They need to know if you have any of these conditions:  bleeding disorders, hemorrhage, or hemophilia  infection of the heart or heart valves  kidney or liver disease  previous stroke  prosthetic heart valve  recent surgery or delivery of a baby  ulcer in the stomach or intestine, diverticulitis, or other bowel disease  an unusual or allergic reaction to enoxaparin, heparin, pork or pork products, other medicines, foods, dyes, or preservatives  pregnant or trying to get pregnant  breast-feeding How should I use this medicine? This medicine is for injection under the skin. It is usually given by a health-care professional. You or a family member may be trained on how to give the injections. If you are to give yourself injections, make sure you understand how to use the syringe, measure the dose if necessary, and give the injection. To avoid bruising, do not rub the site where this medicine has been injected. Do not take your medicine more often than directed. Do not stop taking except on the advice of your doctor or health care professional. Make sure you receive a puncture-resistant container to dispose of the needles and syringes once you have finished with them. Do not reuse these items. Return the container to your doctor or health care professional for proper disposal. Talk to your pediatrician regarding the use of this medicine in children. Special care may be needed. Overdosage: If you think you have taken too much of this medicine contact a poison  control center or emergency room at once. NOTE: This medicine is only for you. Do not share this medicine with others. What if I miss a dose? If you miss a dose, take it as soon as you can. If it is almost time for your next dose, take only that dose. Do not take double or extra doses. What may interact with this medicine?  aspirin and aspirin-like medicines  certain medicines that treat or prevent blood clots  dipyridamole  NSAIDs, medicines for pain and inflammation, like ibuprofen or naproxen This list may not describe all possible interactions. Give your health care provider a list of all the medicines, herbs, non-prescription drugs, or dietary supplements you use. Also tell them if you smoke, drink alcohol, or use illegal drugs. Some items may interact with your medicine. What should I watch for while using this medicine? Visit your healthcare professional for regular checks on your progress. You may need blood work done while you are taking this medicine. Your condition will be monitored carefully while you are receiving this medicine. It is important not to miss any appointments. If you are going to need surgery or other procedure, tell your healthcare professional that you are using this medicine. Using this medicine for a long time may weaken your bones and increase the risk of bone fractures. Avoid sports and activities that might cause injury while you are using this medicine. Severe falls or injuries can cause unseen bleeding. Be careful when using sharp tools or knives. Consider using an electric razor. Take special care brushing or flossing your   teeth. Report any injuries, bruising, or red spots on the skin to your healthcare professional. Wear a medical ID bracelet or chain. Carry a card that describes your disease and details of your medicine and dosage times. What side effects may I notice from receiving this medicine? Side effects that you should report to your doctor or health  care professional as soon as possible:  allergic reactions like skin rash, itching or hives, swelling of the face, lips, or tongue  bone pain  signs and symptoms of bleeding such as bloody or black, tarry stools; red or dark-brown urine; spitting up blood or brown material that looks like coffee grounds; red spots on the skin; unusual bruising or bleeding from the eye, gums, or nose  signs and symptoms of a blood clot such as chest pain; shortness of breath; pain, swelling, or warmth in the leg  signs and symptoms of a stroke such as changes in vision; confusion; trouble speaking or understanding; severe headaches; sudden numbness or weakness of the face, arm or leg; trouble walking; dizziness; loss of coordination Side effects that usually do not require medical attention (report to your doctor or health care professional if they continue or are bothersome):  hair loss  pain, redness, or irritation at site where injected This list may not describe all possible side effects. Call your doctor for medical advice about side effects. You may report side effects to FDA at 1-800-FDA-1088. Where should I keep my medicine? Keep out of the reach of children. Store at room temperature between 15 and 30 degrees C (59 and 86 degrees F). Do not freeze. If your injections have been specially prepared, you may need to store them in the refrigerator. Ask your pharmacist. Throw away any unused medicine after the expiration date. NOTE: This sheet is a summary. It may not cover all possible information. If you have questions about this medicine, talk to your doctor, pharmacist, or health care provider.  2020 Elsevier/Gold Standard (2017-06-25 11:25:34)     GASTRIC BYPASS/SLEEVE  Home Care Instructions   These instructions are to help you care for yourself when you go home.  Call: If you have any problems. . Call [phone] and ask for the surgeon on call . If you need immediate help, come to the ER  at [place].  . Tell the ER staff that you are a new post-op gastric bypass or gastric sleeve patient   Signs and symptoms to report: . Severe vomiting or nausea o If you cannot keep down clear liquids for longer than 1 day, call your surgeon  . Abdominal pain that does not get better after taking your pain medication . Fever over 100.4 F with chills . Heart beating over 100 beats a minute . Shortness of breath at rest . Chest pain .  Redness, swelling, drainage, or foul odor at incision (surgical) sites .  If your incisions open or pull apart . Swelling or pain in calf (lower leg) . Diarrhea (Loose bowel movements that happen often), frequent watery, uncontrolled bowel movements . Constipation, (no bowel movements for 3 days) if this happens: Pick one o Milk of Magnesia, 2 tablespoons by mouth, 3 times a day for 2 days if needed o Stop taking Milk of Magnesia once you have a bowel movement o Call your doctor if constipation continues Or o Miralax  (instead of Milk of Magnesia) following the label instructions o Stop taking Miralax once you have a bowel movement o Call your doctor if constipation   continues . Anything you think is not normal   Normal side effects after surgery: . Unable to sleep at night or unable to focus . Irritability or moody . Being tearful (crying) or depressed These are common complaints, possibly related to your anesthesia medications that put you to sleep, stress of surgery, and change in lifestyle.  This usually goes away a few weeks after surgery.  If these feelings continue, call your primary care doctor.   Wound Care: You may have surgical glue, steri-strips, or staples over your incisions after surgery . Surgical glue:  Looks like a clear film over your incisions and will wear off a little at a time . Steri-strips: Strips of tape over your incisions. You may notice a yellowish color on the skin under the steri-strips. This is used to make the    steri-strips stick better. Do not pull the steri-strips off - let them fall off . Staples: Staples may be removed before you leave the hospital o If you go home with staples, call [facility], [phone] at for an appointment with your surgeon's nurse to have staples removed 10 days after surgery. . Showering: You may shower two (2) days after your surgery unless your surgeon tells you differently o Wash gently around incisions with warm soapy water, rinse well, and gently pat dry  o No tub baths until staples are removed, steri-strips fall off or glue is gone.    Medications: . Medications should be liquid or crushed if larger than the size of a dime . Extended release pills (medication that release a little bit at a time through the day) should NOT be crushed or cut. (examples include XL, ER, DR, SR) . Depending on the size and number of medications you take, you may need to space (take a few throughout the day)/change the time you take your medications so that you do not over-fill your pouch (smaller stomach) . Make sure you follow-up with your primary care doctor to make medication changes needed during rapid weight loss and life-style changes . If you have diabetes, follow up with the doctor that orders your diabetes medication(s) within one week after surgery and check your blood sugar regularly. . Do not drive while taking prescription pain medication  . It is ok to take Tylenol by the bottle instructions with your pain medicine or instead of your pain medicine as needed.  DO NOT TAKE NSAIDS (EXAMPLES OF NSAIDS:  IBUPROFREN/ NAPROXEN)  Diet:                    First 2 Weeks  You will see the dietician t about two (2) weeks after your surgery. The dietician will increase the types of foods you can eat if you are handling liquids well: . If you have severe vomiting or nausea and cannot keep down clear liquids lasting longer than 1 day, call your surgeon @ ([phone]) Protein  Shake . Drink at least 2 ounces of shake 5-6 times per day . Each serving of protein shakes (usually 8 - 12 ounces) should have: o 15 grams of protein  o And no more than 5 grams of carbohydrate  . Goal for protein each day: o Men = 80 grams per day o Women = 60 grams per day . Protein powder may be added to fluids such as non-fat milk or Lactaid milk or unsweetened Soy/Almond milk (limit to 35 grams added protein powder per serving)  Hydration . Slowly increase the   amount of water and other clear liquids as tolerated (See Acceptable Fluids) . Slowly increase the amount of protein shake as tolerated  .  Sip fluids slowly and throughout the day.  Do not use straws. . May use sugar substitutes in small amounts (no more than 6 - 8 packets per day; i.e. Splenda)  Fluid Goal . The first goal is to drink at least 8 ounces of protein shake/drink per day (or as directed by the nutritionist); some examples of protein shakes are Syntrax Nectar, Adkins Advantage, EAS Edge HP, and Unjury. See handout from pre-op Bariatric Education Class: o Slowly increase the amount of protein shake you drink as tolerated o You may find it easier to slowly sip shakes throughout the day o It is important to get your proteins in first . Your fluid goal is to drink 64 - 100 ounces of fluid daily o It may take a few weeks to build up to this . 32 oz (or more) should be clear liquids  And  . 32 oz (or more) should be full liquids (see below for examples) . Liquids should not contain sugar, caffeine, or carbonation  Clear Liquids: . Water or Sugar-free flavored water (i.e. Fruit H2O, Propel) . Decaffeinated coffee or tea (sugar-free) . Crystal Lite, Wyler's Lite, Minute Maid Lite . Sugar-free Jell-O . Bouillon or broth . Sugar-free Popsicle:   *Less than 20 calories each; Limit 1 per day  Full Liquids: Protein Shakes/Drinks + 2 choices per day of other full liquids . Full liquids must be: o No More Than 15  grams of Carbs per serving  o No More Than 3 grams of Fat per serving . Strained low-fat cream soup (except Cream of Potato or Tomato) . Non-Fat milk . Fat-free Lactaid Milk . Unsweetened Soy Or Unsweetened Almond Milk . Low Sugar yogurt (Dannon Lite & Fit, Greek yogurt; Oikos Triple Zero; Chobani Simply 100; Yoplait 100 calorie Greek - No Fruit on the Bottom)    Vitamins and Minerals . Start 1 day after surgery unless otherwise directed by your surgeon . 2 Chewable Bariatric Specific Multivitamin / Multimineral Supplement with iron (Example: Bariatric Advantage Multi EA) . Chewable Calcium with Vitamin D-3 (Example: 3 Chewable Calcium Plus 600 with Vitamin D-3) o Take 500 mg three (3) times a day for a total of 1500 mg each day o Do not take all 3 doses of calcium at one time as it may cause constipation, and you can only absorb 500 mg  at a time  o Do not mix multivitamins containing iron with calcium supplements; take 2 hours apart . Menstruating women and those with a history of anemia (a blood disease that causes weakness) may need extra iron o Talk with your doctor to see if you need more iron . Do not stop taking or change any vitamins or minerals until you talk to your dietitian or surgeon . Your Dietitian and/or surgeon must approve all vitamin and mineral supplements   Activity and Exercise: Limit your physical activity as instructed by your doctor.  It is important to continue walking at home.  During this time, use these guidelines: . Do not lift anything greater than ten (10) pounds for at least two (2) weeks . Do not go back to work or drive until your surgeon says you can . You may have sex when you feel comfortable  o It is VERY important for female patients to use a reliable birth control method; fertility often increases after   surgery  o All hormonal birth control will be ineffective for 30 days after surgery due to medications given during surgery a barrier method must be  used. o Do not get pregnant for at least 18 months . Start exercising as soon as your doctor tells you that you can o Make sure your doctor approves any physical activity . Start with a simple walking program . Walk 5-15 minutes each day, 7 days per week.  . Slowly increase until you are walking 30-45 minutes per day Consider joining our BELT program. [phone] or email [email or web address]   Special Instructions Things to remember: . Use your CPAP when sleeping if this applies to you  . [facility] has two free Bariatric Surgery Support Groups that meet monthly o The 3rd Thursday of each month, 6 pm, [facility]  o The 2nd Friday of each month, 11:45 am in the private dining room in the basement of  . It is very important to keep all follow up appointments with your surgeon, dietitian, primary care physician, and behavioral health practitioner . Routine follow up schedule with your surgeon include appointments at 2-3 weeks, 6-8 weeks, 6 months, and 1 year at a minimum.  Your surgeon may request to see you more often.   o After the first year, please follow up with your bariatric surgeon and dietitian at least once a year in order to maintain best weight loss results [facility]: [phone] [facility]: [phone] Bariatric Nurse Coordinator: [phone]      Reviewed and Endorsed  by [facility] Patient Education Committee, June, 2016 Edits Approved: Aug, 2018    

## 2019-03-11 NOTE — Progress Notes (Signed)
Patient alert and oriented, pain is controlled. Patient is tolerating fluids, advanced to protein shake today, patient is tolerating well. Reviewed Gastric Bypass discharge instructions with patient and patient is able to articulate understanding. Provided information on BELT program, Support Group and [facility] outpatient pharmacy. All questions answered, will continue to monitor.   Total fluid intake 720 Per dehydration protocol call back one week post op 

## 2019-03-11 NOTE — Progress Notes (Signed)
Patient alert and oriented, Post op day 3.  Provided support and encouragement.  Encouraged pulmonary toilet, ambulation and small sips of liquids.  Was able to tolerate 8 ounces of water, popsicle and 2 ounces of chicken soup unjury.  Unfortunately, patient becomes nauseated and dizzy with movement.  Mucusy emesis after showering this am.  In chair this morning, family to bring patient preferred protein from home at patient request.  Discussed arranging education with patient/friend this am for Lovenox and post discharge education.  To meet family/patient at 10 am.  All questions answered.  Will continue to monitor.

## 2019-03-11 NOTE — Discharge Summary (Signed)
Physician Discharge Summary  Patient ID: Emily Frank MRN: [id number] DOB/AGE: 29/08/1990 29 y.o.  PCP: Claiborne RiggFleming, Zelda W, NP  Admit date: 03/08/2019 Discharge date: 03/11/2019  Admission Diagnoses:  Morbid obesity  Discharge Diagnoses:  same  Principal Problem:   Gastric bypass status for obesity August 2020   Surgery:  Gastric bypass  Discharged Condition: improved  Hospital Course:   Had surgery on Tuesday.  Had nausea on Wednesday-UGI done that looked OK.  Started liquids and advance slowly and ready for disharge on Friday.  Lab normalized.  Lovenox 40 instead of 60 because of Hg 11.    Consults: none  Significant Diagnostic Studies: none    Discharge Exam: Blood pressure 140/87, pulse 81, temperature 98.3 F (36.8 C), temperature source Oral, resp. rate 18, height 5\' 4"  (1.626 m), weight (!) 141.5 kg, last menstrual period 12/01/2018, SpO2 100 %. Incisions OK  Disposition: Discharge disposition: 01-Home or Self Care       Discharge Instructions    Ambulate hourly while awake   Complete by: As directed    Call MD for:  difficulty breathing, headache or visual disturbances   Complete by: As directed    Call MD for:  persistant dizziness or light-headedness   Complete by: As directed    Call MD for:  persistant nausea and vomiting   Complete by: As directed    Call MD for:  redness, tenderness, or signs of infection (pain, swelling, redness, odor or green/yellow discharge around incision site)   Complete by: As directed    Call MD for:  severe uncontrolled pain   Complete by: As directed    Call MD for:  temperature >101 F   Complete by: As directed    Diet bariatric full liquid   Complete by: As directed    Incentive spirometry   Complete by: As directed    Perform hourly while awake     Allergies as of 03/11/2019   No Known Allergies     Medication List    STOP taking these medications   diphenoxylate-atropine 2.5-0.025 MG tablet Commonly known  as: Lomotil   Dulaglutide 1.5 MG/0.5ML Sopn Commonly known as: Trulicity   famotidine 20 MG tablet Commonly known as: PEPCID   hydrOXYzine 25 MG tablet Commonly known as: ATARAX/VISTARIL   metFORMIN 1000 MG tablet Commonly known as: GLUCOPHAGE   ondansetron 4 MG tablet Commonly known as: ZOFRAN   phentermine 37.5 MG tablet Commonly known as: ADIPEX-P   polyethylene glycol 17 g packet Commonly known as: MiraLax   predniSONE 20 MG tablet Commonly known as: DELTASONE   Probiotic-10 Chew     TAKE these medications   ARIPiprazole 2 MG tablet Commonly known as: ABILIFY Take 1 tablet (2 mg total) by mouth at bedtime. For mood   bisacodyl 10 MG suppository Commonly known as: Dulcolax Place 1 suppository (10 mg total) rectally as needed for moderate constipation.   BLACK ELDERBERRY PO Take 2 each by mouth daily.   cholecalciferol 10 MCG (400 UNIT) Tabs tablet Commonly known as: VITAMIN D3 Take 1 tablet (400 Units total) by mouth daily.   enoxaparin 40 MG/0.4ML injection Commonly known as: LOVENOX Inject 0.4 mLs (40 mg total) into the skin every 12 (twelve) hours for 14 days.   ondansetron 4 MG disintegrating tablet Commonly known as: ZOFRAN-ODT Take 1 tablet (4 mg total) by mouth every 6 (six) hours as needed for nausea or vomiting.   oxyCODONE 5 MG immediate release tablet Commonly known as:  Oxy IR/ROXICODONE Take 1 tablet (5 mg total) by mouth every 6 (six) hours as needed for severe pain.   pantoprazole 40 MG tablet Commonly known as: PROTONIX Take 1 tablet (40 mg total) by mouth daily.   sertraline 100 MG tablet Commonly known as: ZOLOFT Take 1 tablet (100 mg total) by mouth at bedtime. For mood   traZODone 50 MG tablet Commonly known as: DESYREL Take 1 tablet (50 mg total) by mouth at bedtime as needed for sleep.   Vitamin D (Ergocalciferol) 1.25 MG (50000 UT) Caps capsule Commonly known as: DRISDOL Take 1 capsule (50,000 Units total) by mouth every  7 (seven) days.      Follow-up Information    Carlena Hurl, PA-C. Go on 03/30/2019.   Specialty: General Surgery Why: at 130 pm Contact information: [place] [address] [phone]        Johnathan Hausen, MD. Go on 05/06/2019.   Specialty: General Surgery Why: at 330pm  Contact information: [address] [place] [address] [phone]           Signed: Pedro Earls 03/11/2019, 12:54 PM

## 2019-03-14 ENCOUNTER — Other Ambulatory Visit: Payer: Self-pay | Admitting: *Deleted

## 2019-03-14 ENCOUNTER — Encounter (HOSPITAL_COMMUNITY): Payer: Self-pay | Admitting: Emergency Medicine

## 2019-03-14 ENCOUNTER — Other Ambulatory Visit: Payer: Self-pay

## 2019-03-14 ENCOUNTER — Emergency Department (HOSPITAL_BASED_OUTPATIENT_CLINIC_OR_DEPARTMENT_OTHER): Payer: No Typology Code available for payment source

## 2019-03-14 ENCOUNTER — Emergency Department (HOSPITAL_COMMUNITY): Payer: No Typology Code available for payment source

## 2019-03-14 ENCOUNTER — Emergency Department (HOSPITAL_COMMUNITY)
Admission: EM | Admit: 2019-03-14 | Discharge: 2019-03-14 | Disposition: A | Discharge status: Home / Self Care | Payer: No Typology Code available for payment source | Attending: Emergency Medicine | Admitting: Emergency Medicine

## 2019-03-14 DIAGNOSIS — R0602 Shortness of breath: Secondary | ICD-10-CM

## 2019-03-14 DIAGNOSIS — R1084 Generalized abdominal pain: Secondary | ICD-10-CM | POA: Insufficient documentation

## 2019-03-14 DIAGNOSIS — Z20828 Contact with and (suspected) exposure to other viral communicable diseases: Secondary | ICD-10-CM | POA: Insufficient documentation

## 2019-03-14 DIAGNOSIS — R600 Localized edema: Secondary | ICD-10-CM | POA: Diagnosis not present

## 2019-03-14 DIAGNOSIS — E669 Obesity, unspecified: Secondary | ICD-10-CM | POA: Diagnosis not present

## 2019-03-14 DIAGNOSIS — Z9884 Bariatric surgery status: Secondary | ICD-10-CM | POA: Diagnosis not present

## 2019-03-14 DIAGNOSIS — Z3202 Encounter for pregnancy test, result negative: Secondary | ICD-10-CM | POA: Diagnosis not present

## 2019-03-14 LAB — COMPREHENSIVE METABOLIC PANEL
ALT: 71 U/L — ABNORMAL HIGH (ref 0–44)
AST: 68 U/L — ABNORMAL HIGH (ref 15–41)
Albumin: 3.7 g/dL (ref 3.5–5.0)
Alkaline Phosphatase: 74 U/L (ref 38–126)
Anion gap: 14 (ref 5–15)
BUN: 13 mg/dL (ref 6–20)
CO2: 21 mmol/L — ABNORMAL LOW (ref 22–32)
Calcium: 9 mg/dL (ref 8.9–10.3)
Chloride: 100 mmol/L (ref 98–111)
Creatinine, Ser: 0.85 mg/dL (ref 0.44–1.00)
GFR calc Af Amer: >60 mL/min (ref >60)
GFR calc non Af Amer: >60 mL/min (ref >60)
Glucose, Bld: 74 mg/dL (ref 70–99)
Potassium: 6.6 mmol/L (ref 3.5–5.1)
Sodium: 135 mmol/L (ref 135–145)
Total Bilirubin: 1.4 mg/dL — ABNORMAL HIGH (ref 0.3–1.2)
Total Protein: 8.5 g/dL — ABNORMAL HIGH (ref 6.5–8.1)

## 2019-03-14 LAB — CBC WITH DIFFERENTIAL/PLATELET
Abs Immature Granulocytes: 0.02 10*3/uL (ref 0.00–0.07)
Basophils Absolute: 0 10*3/uL (ref 0.0–0.1)
Basophils Relative: 0 %
Eosinophils Absolute: 0.1 10*3/uL (ref 0.0–0.5)
Eosinophils Relative: 1 %
HCT: 43.8 % (ref 36.0–46.0)
Hemoglobin: 13.4 g/dL (ref 12.0–15.0)
Immature Granulocytes: 0 %
Lymphocytes Relative: 24 %
Lymphs Abs: 2.2 10*3/uL (ref 0.7–4.0)
MCH: 25.9 pg — ABNORMAL LOW (ref 26.0–34.0)
MCHC: 30.6 g/dL (ref 30.0–36.0)
MCV: 84.6 fL (ref 80.0–100.0)
Monocytes Absolute: 0.8 10*3/uL (ref 0.1–1.0)
Monocytes Relative: 8 %
Neutro Abs: 6.1 10*3/uL (ref 1.7–7.7)
Neutrophils Relative %: 67 %
Platelets: 337 10*3/uL (ref 150–400)
RBC: 5.18 MIL/uL — ABNORMAL HIGH (ref 3.87–5.11)
RDW: 14.2 % (ref 11.5–15.5)
WBC: 9.2 10*3/uL (ref 4.0–10.5)
nRBC: 0 % (ref 0.0–0.2)

## 2019-03-14 LAB — APTT: aPTT: 34 seconds (ref 24–36)

## 2019-03-14 LAB — URINALYSIS, ROUTINE W REFLEX MICROSCOPIC
Bilirubin Urine: NEGATIVE
Glucose, UA: NEGATIVE mg/dL
Hgb urine dipstick: NEGATIVE
Ketones, ur: 80 mg/dL — AB
Nitrite: NEGATIVE
Protein, ur: NEGATIVE mg/dL
Specific Gravity, Urine: 1.028 (ref 1.005–1.030)
pH: 5 (ref 5.0–8.0)

## 2019-03-14 LAB — PROTIME-INR
INR: 1.2 (ref 0.8–1.2)
Prothrombin Time: 14.7 seconds (ref 11.4–15.2)

## 2019-03-14 LAB — POTASSIUM: Potassium: 4 mmol/L (ref 3.5–5.1)

## 2019-03-14 LAB — PREGNANCY, URINE: Preg Test, Ur: NEGATIVE

## 2019-03-14 LAB — LIPASE, BLOOD: Lipase: 43 U/L (ref 11–51)

## 2019-03-14 LAB — SARS CORONAVIRUS 2 BY RT PCR (HOSPITAL ORDER, PERFORMED IN ~~LOC~~ HOSPITAL LAB): SARS Coronavirus 2: NEGATIVE

## 2019-03-14 LAB — LACTIC ACID, PLASMA: Lactic Acid, Venous: 1.2 mmol/L (ref 0.5–1.9)

## 2019-03-14 MED ORDER — IOHEXOL 300 MG/ML  SOLN
30.0000 mL | Freq: Once | INTRAMUSCULAR | Status: AC | PRN
  Administered 2019-03-14: 30 mL via ORAL

## 2019-03-14 MED ORDER — SODIUM CHLORIDE 0.9 % IV BOLUS
500.0000 mL | Freq: Once | INTRAVENOUS | Status: AC
  Administered 2019-03-14: 16:00:00 500 mL via INTRAVENOUS

## 2019-03-14 MED ORDER — IOHEXOL 350 MG/ML SOLN
100.0000 mL | Freq: Once | INTRAVENOUS | Status: AC | PRN
  Administered 2019-03-14: 18:00:00 100 mL via INTRAVENOUS

## 2019-03-14 NOTE — Discharge Instructions (Signed)
Please keep using your incentive spirometer at home.  Please make sure that you are drinking enough water to stay well-hydrated.  Today your CT scan was reassuring.  You did not have any evidence of blood clots in your legs or in your chest and your coronavirus test was negative.  Develop fevers, worsening symptoms or have additional concerns please seek additional medical care and evaluation.  Please schedule a follow-up appointment with your surgeon.

## 2019-03-14 NOTE — Progress Notes (Signed)
VASCULAR LAB PRELIMINARY  PRELIMINARY  PRELIMINARY  PRELIMINARY  Bilateral lower extremity venous duplex completed.    Preliminary report:  See CV proc for preliminary results.   Roby Spalla, RVT 03/14/2019, 3:33 PM
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
## 2019-03-14 NOTE — Patient Outreach (Addendum)
[facility]) Care Management  03/14/2019  Emily Frank September 26, 1990 [id number]   Transition of care telephone call  Referral received: 03/09/19 Initial outreach: 03/14/19 Insurance: [employer]  [place] returned all to this RNCM in response to voice mail, she states she is on her way back to the hospital with c/o shortness of breath,tachycardia, and activity intolerance.   Objective: Per the electronic medical record, Emily Frank was hospitalized at [facility] from 8/25-8/28 for Roux En Y Gastric Bypass Surgery.  Comorbidities include:polycystic ovarian syndrome,  Insulin resistance- Hgb A1C= 5.4% on 11/08/18, B12 deficiency, depression, morbid obesity, panic attacks She was discharged to home on 03/11/19 without the need for home health services or durable medical equipment per the discharge summary.  She has the following post operative appointments scheduled: 03/23/19- Bariatric class; 03/29/19 follow up with primary care provider;  9/16 follow up with surgeon's PA; 10/23 follow up with surgeon.  Plan: Advised patient this RNCM will attempt another outreach to complete transition of care assessment once she has returned home.  Barrington Ellison RN,CCM,CDE [place] Management Coordinator Office Phone [phone] Office Fax [phone]

## 2019-03-14 NOTE — ED Triage Notes (Addendum)
Patient reports gastric bypass x6 days ago and SOB x2 days. Reports SOB increases with exertion. Denies pain. Denies fever and cough.

## 2019-03-14 NOTE — ED Notes (Signed)
CRITICAL VALUE STICKER  CRITICAL VALUE: Potassium 6.6  RECEIVER (on-site recipient of call): Sharlyn Bologna RN  DATE & TIME NOTIFIED: 8/31 1512  MESSENGER (representative from lab): Lab  MD NOTIFIED: Benjamine Mola PA  TIME OF NOTIFICATION: 1512  RESPONSE: See orders

## 2019-03-15 ENCOUNTER — Telehealth (HOSPITAL_COMMUNITY): Payer: Self-pay

## 2019-03-15 ENCOUNTER — Other Ambulatory Visit: Payer: Self-pay | Admitting: *Deleted

## 2019-03-15 ENCOUNTER — Encounter: Payer: Self-pay | Admitting: *Deleted

## 2019-03-15 NOTE — Patient Outreach (Signed)
  [facility]) Care Management  03/15/2019  Emily Frank March 19, 1991 [id number]  Transition of care call/case closure   Referral received: 03/09/19 Initial outreach: 03/14/19 Insurance: [place] [employer]   Subjective: Successful telephone call to patient's preferred (mobile) number in order to complete transition of care assessment; 2 HIPAA identifiers verified. Explained purpose of call and completed transition of care assessment.  Frank states she feels much better than yesterday , no longer having shortness of breath with ambulation. Says she was discharged from the [facility] Emergency department yesterday after presenting there with Emily/o shortness of breath and tachycardia. She says they ruled out a pulmonary embolism and DVT. She says she is trying her best to meet her fluid intake goals and that she spoke with bariatric nurse coordinator earlier today by phone.    She says she does not know if she purchased the hospital indemnity voluntary insurance. She says she uses a [facility] outpatient pharmacy.  She denies educational needs related to staying safe during the COVID 19 pandemic.   Objective: Per the electronic medical record, Emily Frank was hospitalized at [facility] from 8/25-8/28 for Roux En Y Gastric Bypass Surgery.  Comorbidities include:polycystic ovarian syndrome,  Insulin resistance- Hgb A1C= 5.4% on 11/08/18, B12 deficiency, depression, morbid obesity, panic attacks She was discharged to home on 03/11/19 without the need for home health services or durable medical equipment per the discharge summary.  She went to the [facility] Emergency department on 8/31 for Emily/o dyspnea and tachycardia and her work up for DVT and pulmonary embolism was negative and she did not require admission. She has the following post operative appointments scheduled: 03/23/19- Bariatric class; 03/29/19 follow up with primary care provider;  9/16 follow up with surgeon's PA;  10/23 follow up with surgeon.   Assessment:  Patient voices good understanding of all discharge instructions.  See transition of care flowsheet for assessment details.   Plan:  Provided Jhanae with the phone number for [facility] so she can determine if she has the hospital indemnity insurance.  Reviewed hospital discharge diagnosis of Roux En Y Gastric Bypass surgery and treatment plan using hospital discharge instructions, assessing medication adherence, reviewing problems requiring provider notification, and discussing the importance of follow up with surgeon, primary care provider and/or specialists as directed. No ongoing care management needs identified so will close case to [place] Management services and route successful outreach letter with [place] Management pamphlet and 24 Hour Nurse Line Magnet to [place] [facility] clinical pool to be mailed to patient's home address.   Barrington Ellison RN,CCM,CDE [facility] Management Coordinator Office Phone [phone] Office Fax [phone]

## 2019-03-15 NOTE — Telephone Encounter (Signed)
Patient called to discuss post bariatric surgery follow up questions.  See below:   1.  Tell me about your pain and pain management?denies  2.  Let's talk about fluid intake.  How much total fluid are you taking in?33 ounces of fluid plus also eating ice, gave patient goal of 45 ounces or more today tomorrow will follow up Wednesday.    3.  How much protein have you taken in the last 2 days?30 grams of protein has unflavored protein, encouraged to use this protein in fluids to increase fluid intake  4.  Have you had nausea?  Tell me about when have experienced nausea and what you did to help?denies  5.  Has the frequency or color changed with your urine?darker this morning, but gets lighter  6.  Tell me what your incisions look like?no problems  7.  Have you been passing gas? BM?had bm passing gas  8.  If a problem or question were to arise who would you call?  Do you know contact numbers for [place], [facility], and [facility]?aware of how to contact all services  9.  How has the walking going?walking around home  10.  How are your vitamins and calcium going?  How are you taking them?mvi started today and has not started calcium calcium  Went to ED due to SOB.  Worked up for DVT/PE.  Negative doppler and Spiral CT.  Received IV fluids felt much better by discharge

## 2019-03-18 ENCOUNTER — Telehealth (HOSPITAL_COMMUNITY): Payer: Self-pay

## 2019-03-23 ENCOUNTER — Encounter: Payer: No Typology Code available for payment source | Attending: Surgery | Admitting: Skilled Nursing Facility1

## 2019-03-23 ENCOUNTER — Other Ambulatory Visit: Payer: Self-pay

## 2019-03-23 DIAGNOSIS — E669 Obesity, unspecified: Secondary | ICD-10-CM | POA: Insufficient documentation

## 2019-03-23 NOTE — Progress Notes (Signed)
2 Week Post-Operative Nutrition Class   Patient was seen on 09/07/18 for Post-Operative Nutrition education [facility].   Pt states she had to go to the hospital for dehydration stating she had nausea. Pt states she currently has no nausea with drinking about 60 ounces of fluid.  Pt states it has been 3 days since she has had a bowel movement. Pt state she is struggling to remember all 3 calcium.    Surgery date: 03/08/2019 Surgery type: RYGB Start weight at [facility]: 326.5 Weight today: 295.3   Body Composition Scale 03/23/2019  Total Body Fat % 48  Visceral Fat 15  Fat-Free Mass % 51.9   Total Body Water % 40.4   Muscle-Mass lbs 33.6  Body Fat Displacement          Torso  lbs 88         Left Leg  lbs 17.6         Right Leg  lbs 17.6         Left Arm  lbs 8.8         Right Arm   lbs 8.8     The following the learning objectives were met by the patient during this course:  Identifies Phase 3 (Soft, High Proteins) Dietary Goals and will begin from 2 weeks post-operatively to 2 months post-operatively  Identifies appropriate sources of fluids and proteins   States protein recommendations and appropriate sources post-operatively  Identifies the need for appropriate texture modifications, mastication, and bite sizes when consuming solids  Identifies appropriate multivitamin and calcium sources post-operatively  Describes the need for physical activity post-operatively and will follow MD recommendations  States when to call healthcare provider regarding medication questions or post-operative complications   Handouts given during class include:  Phase 3A: Soft, High Protein Diet Handout   Follow-Up Plan: Patient will follow-up at [facility] in 6 weeks for 2 month post-op nutrition visit for diet advancement per MD.

## 2019-03-29 ENCOUNTER — Other Ambulatory Visit: Payer: Self-pay

## 2019-03-29 ENCOUNTER — Ambulatory Visit: Payer: No Typology Code available for payment source | Attending: Nurse Practitioner | Admitting: Pharmacist

## 2019-03-29 ENCOUNTER — Telehealth (HOSPITAL_BASED_OUTPATIENT_CLINIC_OR_DEPARTMENT_OTHER): Payer: No Typology Code available for payment source | Admitting: Nurse Practitioner

## 2019-03-29 ENCOUNTER — Encounter: Payer: Self-pay | Admitting: Nurse Practitioner

## 2019-03-29 DIAGNOSIS — Z9884 Bariatric surgery status: Secondary | ICD-10-CM | POA: Diagnosis not present

## 2019-03-29 DIAGNOSIS — Z23 Encounter for immunization: Secondary | ICD-10-CM

## 2019-03-29 DIAGNOSIS — E282 Polycystic ovarian syndrome: Secondary | ICD-10-CM

## 2019-03-29 NOTE — Progress Notes (Signed)
Patient presents for vaccination against influenza per orders of Zelda. Consent given. Counseling provided. No contraindications exists. Vaccine administered without incident.   
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
## 2019-03-30 ENCOUNTER — Telehealth: Payer: Self-pay | Admitting: Dietician

## 2019-03-30 NOTE — Telephone Encounter (Signed)
I spoke with patient via telephone to assess fluid intake and food tolerance since diet advancement to solid protein foods on 03/23/2019.  Surgery Date: 03/08/2019 Surgery Type: RYGB  Daily fluid intake: 48-64 ounces Daily protein intake: 60 grams  Patient states she has not been able to tolerate solid foods, other than eggs and cheese. States she tried eggs multiple times before she was finally able to tolerate yesterday. Shrimp, chicken, hamburger, beans, etc. do not sit well, pt states it hurts to swallow and she feels as though she can't get them down. Eats a lot of ice. Drinks protein shakes without issues, states this is how she reaches daily protein goal. Having BM regularly, states they are small. Has an appointment with surgeon today, states she wants to go in for rehydration again.    I advised pt to continue trying soft solid protein foods slowly. Since having difficulty tolerating, continue with protein shakes as much as needed in order to reach daily protein goal.   Plan to follow up at next visit at [place] on 05/10/2019.    Nat Christen Winton) Short, MS, RD, LDN

## 2019-04-12 ENCOUNTER — Ambulatory Visit (INDEPENDENT_AMBULATORY_CARE_PROVIDER_SITE_OTHER): Payer: No Typology Code available for payment source | Admitting: Obstetrics and Gynecology

## 2019-04-12 ENCOUNTER — Encounter: Payer: Self-pay | Admitting: Obstetrics and Gynecology

## 2019-04-12 ENCOUNTER — Other Ambulatory Visit: Payer: Self-pay

## 2019-04-12 VITALS — BP 125/82 | HR 83 | Temp 98.1°F | Wt 292.7 lb

## 2019-04-12 DIAGNOSIS — Z113 Encounter for screening for infections with a predominantly sexual mode of transmission: Secondary | ICD-10-CM

## 2019-04-12 DIAGNOSIS — Z01419 Encounter for gynecological examination (general) (routine) without abnormal findings: Secondary | ICD-10-CM

## 2019-04-12 DIAGNOSIS — N898 Other specified noninflammatory disorders of vagina: Secondary | ICD-10-CM

## 2019-04-12 DIAGNOSIS — Z124 Encounter for screening for malignant neoplasm of cervix: Secondary | ICD-10-CM

## 2019-04-12 DIAGNOSIS — Z3141 Encounter for fertility testing: Secondary | ICD-10-CM
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
## 2019-04-12 NOTE — Progress Notes (Signed)
Pt presents for annual, pap, and all STD's.  Flu vaccine UTD per pt  Declines BC - Female partner  1 mo post op Gastric Roux-EN-Y - doing well

## 2019-04-13 LAB — CERVICOVAGINAL ANCILLARY ONLY
Bacterial Vaginitis (gardnerella): NEGATIVE
Candida Glabrata: NEGATIVE
Candida Vaginitis: NEGATIVE
Chlamydia: NEGATIVE
Molecular Disclaimer: NEGATIVE
Molecular Disclaimer: NEGATIVE
Molecular Disclaimer: NEGATIVE
Molecular Disclaimer: NEGATIVE
Molecular Disclaimer: NORMAL
Molecular Disclaimer: NORMAL
Neisseria Gonorrhea: NEGATIVE
Trichomonas: NEGATIVE

## 2019-04-13 LAB — HEPATITIS C ANTIBODY: Hep C Virus Ab: <0.1 s/co ratio (ref 0.0–0.9)

## 2019-04-13 LAB — HEPATITIS B SURFACE ANTIGEN: Hepatitis B Surface Ag: NEGATIVE

## 2019-04-13 LAB — RPR: RPR Ser Ql: NONREACTIVE

## 2019-04-13 LAB — HIV ANTIBODY (ROUTINE TESTING W REFLEX): HIV Screen 4th Generation wRfx: NONREACTIVE

## 2019-04-14 LAB — CYTOLOGY - PAP: Diagnosis: NEGATIVE

## 2019-04-20 ENCOUNTER — Other Ambulatory Visit: Payer: Self-pay | Admitting: Nurse Practitioner

## 2019-04-20 MED ORDER — BENZONATATE 200 MG PO CAPS: 200.0000 mg | ORAL_CAPSULE | Freq: Two times a day (BID) | ORAL | 0 refills | Status: AC | PRN

## 2019-04-29 ENCOUNTER — Other Ambulatory Visit: Payer: Self-pay | Admitting: Nurse Practitioner

## 2019-04-29 DIAGNOSIS — L2489 Irritant contact dermatitis due to other agents: Secondary | ICD-10-CM

## 2019-05-10 ENCOUNTER — Other Ambulatory Visit: Payer: Self-pay

## 2019-05-10 ENCOUNTER — Encounter: Payer: No Typology Code available for payment source | Attending: Surgery | Admitting: Skilled Nursing Facility1

## 2019-05-10 DIAGNOSIS — E669 Obesity, unspecified: Secondary | ICD-10-CM | POA: Diagnosis not present

## 2019-05-10 NOTE — Progress Notes (Signed)
Nutrition Follow-Up Visit for Bariatric Surgery Medical Nutrition Therapy  Appt Start Time: 10:00 End Time: 10:30  2 Months Post-Operative RYGB Surgery Surgery Date: 03/08/2019  Pt's Expectations of Surgery/ Goals:   NUTRITION ASSESSMENT    Anthropometrics  Start weight at [facility]: 326.5 lbs (date: ) Today's weight: 282.8 lbs Weight change: 13 lbs (since previous nutrition visit)  Body Composition Scale 03/23/2019 05/10/2019  Total Body Fat % 48 47  Visceral Fat 15 14  Fat-Free Mass % 51.9 52.9   Total Body Water % 40.4 40.9   Muscle-Mass lbs 33.6 33.5  Body Fat Displacement           Torso  lbs 88 82.6         Left Leg  lbs 17.6 16.5         Right Leg  lbs 17.6 16.5         Left Arm  lbs 8.8 8.2         Right Arm   lbs 8.8 8.2   Clinical  Medical hx: PCOS, depression, MDD Medications: see list Labs:    Lifestyle & Dietary Hx (including living situation, sleep regimen, functional ability, weight hx, current dietary patterns, fluid intake, supplements, physical activity, etc)  Pt reports not drinking enough because she is too busy at work and her work will not let her have fluid at work. Pt states she will not be celebrating thanksgiving this year.   Estimated daily fluid intake: 32 oz Estimated daily protein intake: 60+ g Supplements: bari chewable and 2 times a day calcium Current average weekly physical activity: 30 minutes at the gym 2 days a week  24-Hr Dietary Recall First Meal: eggs and cheese Snack: yogurt Second Meal: grilled chicken or shrimp or steak Snack: belvita crackers or yogurt Third Meal: chicken or steak or [place] Snack:  Beverages: protein water (1-2), water with flavoring, zero sugar minute made  Post-Op Goals/ Signs/ Symptoms Using straws: no Drinking while eating: no Chewing/swallowing difficulties: no Changes in vision: no Changes to mood/headaches: no Hair loss/changes to skin/nails: no Difficulty focusing/concentrating: no Sweating:  no Dizziness/lightheadedness: no Palpitations: no  Carbonated/caffeinated beverages: no N/V/D/C/Gas: no Abdominal pain: no Dumping syndrome: no  Goals: -Ask your doctor for a note to allow you to have fluid at your desk  -Continue to aim for a minimum of 64 fluid ounces 7 days a week with at least 30 ounces being plain water -Eat non-starchy vegetables 2 times a day 7 days a week -Start out with soft cooked vegetables today and tomorrow; if tolerated begin to eat raw vegetables or cooked including salads -Eat your 3 ounces of protein first then start in on your non-starchy vegetables; once you understand how much of your meal leads to satisfaction and not full while still eating 3 ounces of protein and non-starchy vegetables you can eat them in any order  -Continue to aim for 30 minutes of activity at least 5 times a week -Do NOT cook with/add to your food: alfredo sauce, cheese sauce, barbeque sauce, ketchup, fat back, butter, bacon grease, grease, Crisco    NUTRITION DIAGNOSIS  Overweight/obesity ([place]-3.3) related to past poor dietary habits and physical inactivity as evidenced by completed bariatric surgery and following dietary guidelines for continued weight loss and healthy nutrition status.     NUTRITION INTERVENTION Nutrition counseling (C-1) and education (E-2) to facilitate bariatric surgery goals, including: . Diet advancement to the next phase (phase non starchy vegetables) now including nonstarchy vegetables  .  The importance of consuming adequate calories as well as certain nutrients daily due to the body's need for essential vitamins, minerals, and fats . The importance of daily physical activity and to reach a goal of at least 150 minutes of moderate to vigorous physical activity weekly (or as directed by their physician) due to benefits such as increased musculature and improved lab values  Handouts Provided Include   [facility] phone number  Learning Style & Readiness for  Change Teaching method utilized: Visual & Auditory  Demonstrated degree of understanding via: Teach Back  Barriers to learning/adherence to lifestyle change: barriers   RD's Notes for Next Visit . Check in on fluid next time   MONITORING & EVALUATION Dietary intake, weekly physical activity, body weight  Next Steps Patient is to follow-up in 2 months

## 2019-05-10 NOTE — Patient Instructions (Signed)
-  Continue to aim for a minimum of 64 fluid ounces 7 days a week with at least 30 ounces being plain water  -Eat non-starchy vegetables 2 times a day 7 days a week  -Start out with soft cooked vegetables today and tomorrow; if tolerated begin to eat raw vegetables or cooked including salads  -Eat your 3 ounces of protein first then start in on your non-starchy vegetables; once you understand how much of your meal leads to satisfaction and not full while still eating 3 ounces of protein and non-starchy vegetables you can eat them in any order   -Continue to aim for 30 minutes of activity at least 5 times a week  -Do NOT cook with/add to your food: alfredo sauce, cheese sauce, barbeque sauce, ketchup, fat back, butter, bacon grease, grease, Crisco   

## 2019-06-03 ENCOUNTER — Other Ambulatory Visit: Payer: Self-pay

## 2019-06-03 ENCOUNTER — Ambulatory Visit
Admission: EM | Admit: 2019-06-03 | Discharge: 2019-06-03 | Disposition: A | Discharge status: Home / Self Care | Payer: No Typology Code available for payment source | Attending: Physician Assistant | Admitting: Physician Assistant

## 2019-06-03 DIAGNOSIS — R0789 Other chest pain: Secondary | ICD-10-CM

## 2019-06-03 MED ORDER — TIZANIDINE HCL 4 MG PO TABS: 4.0000 mg | ORAL_TABLET | Freq: Four times a day (QID) | ORAL | 0 refills | Status: DC | PRN

## 2019-06-03 NOTE — Discharge Instructions (Signed)
Tizanidine as needed, this can make you drowsy, so do not take if you are going to drive, operate heavy machinery, or make important decisions. Ice/heat compresses as needed. This can take up to 3-4 weeks to completely resolve, but you should be feeling better each week. Decrease exercise load for the next 3-4 days and add as tolerated. Restart protonix to prevent stomach upset. Follow up with PCP if symptoms not improving.

## 2019-06-03 NOTE — ED Triage Notes (Signed)
Pt c/o rt upper chest pain shooting through to upper back yesterday evening while working out on the exercise bike.
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
## 2019-06-24 ENCOUNTER — Encounter: Payer: Self-pay | Admitting: Nurse Practitioner

## 2019-06-28 ENCOUNTER — Encounter: Payer: No Typology Code available for payment source | Attending: Surgery | Admitting: Skilled Nursing Facility1

## 2019-06-28 ENCOUNTER — Other Ambulatory Visit: Payer: Self-pay

## 2019-06-28 DIAGNOSIS — E669 Obesity, unspecified: Secondary | ICD-10-CM | POA: Insufficient documentation

## 2019-06-28 NOTE — Progress Notes (Signed)
Nutrition Follow-Up Visit for Bariatric Surgery Medical Nutrition Therapy  Appt Start Time: 10:00 End Time: 10:30  2 Months Post-Operative RYGB Surgery Surgery Date: 03/08/2019  NUTRITION ASSESSMENT    Anthropometrics  Start weight at [facility]: 326.5 lbs (date: ) Today's weight: 282.8 lbs   Weight change: 17.6 lbs (since previous nutrition visit)  Body Composition Scale 03/23/2019 05/10/2019 06/28/2019  Total Body Fat % 48 47 45.6  Visceral Fat 15 14 13   Fat-Free Mass % 51.9 52.9 54.3   Total Body Water % 40.4 40.9 41.6   Muscle-Mass lbs 33.6 33.5 33.3  Body Fat Displacement            Torso  lbs 88 82.6 75         Left Leg  lbs 17.6 16.5 15         Right Leg  lbs 17.6 16.5 15         Left Arm  lbs 8.8 8.2 7.5         Right Arm   lbs 8.8 8.2 7.5   Clinical  Medical hx: PCOS, depression, MDD Medications: see list Labs:    Lifestyle & Dietary Hx (including living situation, sleep regimen, functional ability, weight hx, current dietary patterns, fluid intake, supplements, physical activity, etc)  Pt arrives with all the foods she is going to eat today. Pt goes to [employer] for drinks. Pt states she goes to the gym 2-3 times 7 days a week for the past month. 350 reps  Pt states she was able to make changes more recently due to her friend yelling ather she has not been doing right.  Pt states she tries to eat greens every day because she was constipated for a  while.  Pt answered her phone during the appointment.   Estimated daily fluid intake: 32 oz Carbohydrate: 50+ grams Estimated daily protein intake: 67+ g Supplements: bari capsule 3 a day and 2 times a day calcium Current average weekly physical activity: 2-3 times Day at gym 7 days a week  24-Hr Dietary Recall First Meal: eggs and cheese or protein drink with 24g pro with coffee and sugar free white chocolate syrup  Snack: greek yogurt (12g pro) Second Meal: salmon in a pack (15g pro) on spring mix salad with  bacon and almonds with skinny girl dressing Snack: boom chic pop 140 calories (eating half the bag) or cheese and meat/nuts  Third Meal: lean cuisine frozen meal 290 cal; 16g pro Snack:  Beverages: water 68 ounces   Post-Op Goals/ Signs/ Symptoms Using straws: no Drinking while eating: no Chewing/swallowing difficulties: no Changes in vision: no Changes to mood/headaches: no Hair loss/changes to skin/nails: no Difficulty focusing/concentrating: no Sweating: no Dizziness/lightheadedness: no Palpitations: no  Carbonated/caffeinated beverages: no N/V/D/C/Gas: no Abdominal pain: no Dumping syndrome: no  Goals: Add starchy vegetables back into your diet 1/4 cup beans or corn into your salad or if not having your frozen meal 1/4 potato with non starchy vegetables and protein If eating an avocado limit to 1/4 so it should take you 4 days to eat 1 avocado Keep up the activity, great job!!    NUTRITION DIAGNOSIS  Overweight/obesity ([place]-3.3) related to past poor dietary habits and physical inactivity as evidenced by completed bariatric surgery and following dietary guidelines for continued weight loss and healthy nutrition status.     NUTRITION INTERVENTION Nutrition counseling (C-1) and education (E-2) to facilitate bariatric surgery goals, including: . Diet advancement to the next phase (phase  non starchy vegetables) now including nonstarchy vegetables  . The importance of consuming adequate calories as well as certain nutrients daily due to the body's need for essential vitamins, minerals, and fats . The importance of daily physical activity and to reach a goal of at least 150 minutes of moderate to vigorous physical activity weekly (or as directed by their physician) due to benefits such as increased musculature and improved lab values . Why you need complex carbohydrates: Whole grains and other complex carbohydrates are required to have a healthy diet. Whole grains provide fiber which  can help with blood glucose levels and help keep you satiated. Fruits and starchy vegetables provide essential vitamins and minerals required for immune function, eyesight support, brain support, bone density, wound healing and many other functions within the body. According to the current evidenced based 2015-2020 Dietary Guidelines for Americans, complex carbohydrates are part of a healthy eating pattern which is associated with a decreased risk for type 2 diabetes, cancers, and cardiovascular disease.   Handouts Provided Include   Phase 5  Learning Style & Readiness for Change Teaching method utilized: Visual & Auditory  Demonstrated degree of understanding via: Teach Back  Barriers to learning/adherence to lifestyle change: barriers    MONITORING & EVALUATION Dietary intake, weekly physical activity, body weight  Next Steps Patient is to follow-up in 2 months

## 2019-07-11 ENCOUNTER — Other Ambulatory Visit: Payer: Self-pay | Admitting: Nurse Practitioner

## 2019-07-11 DIAGNOSIS — N6314 Unspecified lump in the right breast, lower inner quadrant: Secondary | ICD-10-CM

## 2019-07-22 ENCOUNTER — Other Ambulatory Visit: Payer: Self-pay

## 2019-07-22 ENCOUNTER — Ambulatory Visit
Admission: RE | Admit: 2019-07-22 | Discharge: 2019-07-22 | Disposition: A | Discharge status: Home / Self Care | Payer: 59 | Source: Ambulatory Visit | Attending: Nurse Practitioner | Admitting: Nurse Practitioner

## 2019-07-22 DIAGNOSIS — Z9884 Bariatric surgery status: Secondary | ICD-10-CM | POA: Diagnosis not present

## 2019-07-22 DIAGNOSIS — N6314 Unspecified lump in the right breast, lower inner quadrant: Secondary | ICD-10-CM

## 2019-07-22 DIAGNOSIS — N6489 Other specified disorders of breast: Secondary | ICD-10-CM | POA: Diagnosis not present

## 2019-07-26 ENCOUNTER — Other Ambulatory Visit: Payer: Self-pay | Admitting: Nurse Practitioner

## 2019-07-27 ENCOUNTER — Other Ambulatory Visit: Payer: Self-pay | Admitting: Nurse Practitioner

## 2019-07-27 MED ORDER — MUPIROCIN CALCIUM 2 % EX CREA: 1.0000 "application " | TOPICAL_CREAM | Freq: Two times a day (BID) | CUTANEOUS | 1 refills | Status: DC

## 2019-08-01 ENCOUNTER — Other Ambulatory Visit: Payer: Self-pay | Admitting: Nurse Practitioner

## 2019-08-01 DIAGNOSIS — E559 Vitamin D deficiency, unspecified: Secondary | ICD-10-CM

## 2019-08-01 DIAGNOSIS — E538 Deficiency of other specified B group vitamins: Secondary | ICD-10-CM

## 2019-08-01 DIAGNOSIS — E8881 Metabolic syndrome: Secondary | ICD-10-CM

## 2019-08-01 DIAGNOSIS — R5383 Other fatigue: Secondary | ICD-10-CM

## 2019-08-02 ENCOUNTER — Other Ambulatory Visit: Payer: Self-pay

## 2019-08-02 ENCOUNTER — Ambulatory Visit: Payer: 59 | Attending: Nurse Practitioner

## 2019-08-02 DIAGNOSIS — E538 Deficiency of other specified B group vitamins: Secondary | ICD-10-CM | POA: Diagnosis not present

## 2019-08-02 DIAGNOSIS — E8881 Metabolic syndrome: Secondary | ICD-10-CM

## 2019-08-02 DIAGNOSIS — E559 Vitamin D deficiency, unspecified: Secondary | ICD-10-CM

## 2019-08-02 DIAGNOSIS — R5383 Other fatigue: Secondary | ICD-10-CM

## 2019-08-03 ENCOUNTER — Other Ambulatory Visit: Payer: Self-pay | Admitting: Nurse Practitioner

## 2019-08-03 LAB — VITAMIN D 25 HYDROXY (VIT D DEFICIENCY, FRACTURES): Vit D, 25-Hydroxy: 13.7 ng/mL — ABNORMAL LOW (ref 30.0–100.0)

## 2019-08-03 LAB — CMP14+EGFR
ALT: 9 IU/L (ref 0–32)
AST: 18 IU/L (ref 0–40)
Albumin/Globulin Ratio: 1.3 (ref 1.2–2.2)
Albumin: 4 g/dL (ref 3.9–5.0)
Alkaline Phosphatase: 92 IU/L (ref 39–117)
BUN/Creatinine Ratio: 10 (ref 9–23)
BUN: 7 mg/dL (ref 6–20)
Bilirubin Total: 0.3 mg/dL (ref 0.0–1.2)
CO2: 25 mmol/L (ref 20–29)
Calcium: 9.4 mg/dL (ref 8.7–10.2)
Chloride: 103 mmol/L (ref 96–106)
Creatinine, Ser: 0.7 mg/dL (ref 0.57–1.00)
GFR calc Af Amer: 135 mL/min/{1.73_m2} (ref >59)
GFR calc non Af Amer: 117 mL/min/{1.73_m2} (ref >59)
Globulin, Total: 3.2 g/dL (ref 1.5–4.5)
Glucose: 74 mg/dL (ref 65–99)
Potassium: 4.4 mmol/L (ref 3.5–5.2)
Sodium: 140 mmol/L (ref 134–144)
Total Protein: 7.2 g/dL (ref 6.0–8.5)

## 2019-08-03 LAB — CBC
Hematocrit: 39.4 % (ref 34.0–46.6)
Hemoglobin: 12.6 g/dL (ref 11.1–15.9)
MCH: 27.8 pg (ref 26.6–33.0)
MCHC: 32 g/dL (ref 31.5–35.7)
MCV: 87 fL (ref 79–97)
Platelets: 263 10*3/uL (ref 150–450)
RBC: 4.53 x10E6/uL (ref 3.77–5.28)
RDW: 14.3 % (ref 11.7–15.4)
WBC: 8.7 10*3/uL (ref 3.4–10.8)

## 2019-08-03 LAB — VITAMIN B12: Vitamin B-12: 1137 pg/mL (ref 232–1245)

## 2019-08-03 MED ORDER — VITAMIN D (ERGOCALCIFEROL) 1.25 MG (50000 UNIT) PO CAPS: 50000.0000 [IU] | ORAL_CAPSULE | ORAL | 1 refills | Status: DC

## 2019-08-29 ENCOUNTER — Encounter: Payer: 59 | Attending: Surgery | Admitting: Skilled Nursing Facility1

## 2019-08-29 ENCOUNTER — Other Ambulatory Visit: Payer: Self-pay

## 2019-08-29 DIAGNOSIS — E669 Obesity, unspecified: Secondary | ICD-10-CM | POA: Diagnosis not present

## 2019-08-29 NOTE — Progress Notes (Signed)
Nutrition Follow-Up Visit for Bariatric Surgery Medical Nutrition Therapy  Appt Start Time: 10:00 End Time: 10:30  2 Months Post-Operative RYGB Surgery Surgery Date: 03/08/2019  NUTRITION ASSESSMENT    Anthropometrics  Start weight at [facility]: 326.5 lbs (02/10/2018) Today's weight: 282.8 lbs   Weight change: 17.6 lbs (since previous nutrition visit)  Body Composition Scale 03/23/2019 05/10/2019 06/28/2019 08/29/2019  Total Body Fat % 48 47 45.6 43.6  Visceral Fat 15 14 13 12   Fat-Free Mass % 51.9 52.9 54.3 56.3   Total Body Water % 40.4 40.9 41.6 46.2   Muscle-Mass lbs 33.6 33.5 33.3 33.1  Body Fat Displacement             Torso  lbs 88 82.6 75 66.0         Left Leg  lbs 17.6 16.5 15 13.2         Right Leg  lbs 17.6 16.5 15 13.2         Left Arm  lbs 8.8 8.2 7.5 6.6         Right Arm   lbs 8.8 8.2 7.5 6.6   Clinical  Medical hx: PCOS, depression, MDD Medications: see list Labs: vitamin D 13   Lifestyle & Dietary Hx (including living situation, sleep regimen, functional ability, weight hx, current dietary patterns, fluid intake, supplements, physical activity, etc)  Pt answered her phone during the appointment.  Pt has problem with bowel movements and is taking herbalife fiber supplement with shake. She notes she is getting more fluid, eating lean fish or salad w/fish, more homemade meals and spinach. Pt states she eats 2-3 Baby-bel cheese and yogurt. Pt reports she is nervous about eating out. Pt states she does not eat snacks and does not eat after 6p. She reports she eats 30z protein and less than 1/2 cup veggies. Pt reports multivitamin makes her sick (taking in am) and calcium supplement is fine. She reports potato hurts her stomach exploding feeling instant potato after few bites pain.  Labs: Vitamin D 13.7    Estimated daily fluid intake: 32 oz Carbohydrate: 50+ grams Estimated daily protein intake: 67+ g Supplements: bari capsule 3 a day and 2 times a day  calcium Current average weekly physical activity: 2-3 times Day at gym 7 days a week  24-Hr Dietary Recall First Meal: bkfast shake 30g protein herbalife tea pre-made lunch meal dinner vegetables plant-based protein Snack: greek yogurt (12g pro) Second Meal: pre-made meal Snack:  Third Meal: vegetables, fish or fish in salad, spinach, yogurt Snack:  Beverages: water 64 ounces   Post-Op Goals/ Signs/ Symptoms Using straws: no Drinking while eating: no Chewing/swallowing difficulties: no Changes in vision: no Changes to mood/headaches: no Hair loss/changes to skin/nails: no Difficulty focusing/concentrating: no Sweating: no Dizziness/lightheadedness: no Palpitations: no  Carbonated/caffeinated beverages: no N/V/D/C/Gas: constipation Abdominal pain: no Dumping syndrome: no  Goals: Increase beans and vegetable intake Get more fluid: minimum 64 fluid ounces Add starchy vegetables back into your diet 1/4 cup beans or corn into your salad or if not having your frozen meal 1/4 potato with non starchy vegetables and protein If eating an avocado limit to 1/4 so it should take you 4 days to eat 1 avocado Keep up the activity, great job!!    NUTRITION DIAGNOSIS  Overweight/obesity (-3.3) related to past poor dietary habits and physical inactivity as evidenced by completed bariatric surgery and following dietary guidelines for continued weight loss and healthy nutrition status.     NUTRITION INTERVENTION Nutrition  counseling (C-1) and education (E-2) to facilitate bariatric surgery goals, including: . Diet advancement to the next phase (phase non starchy vegetables) now including starchy vegetables  . The importance of consuming adequate calories as well as certain nutrients daily due to the body's need for essential vitamins, minerals, and fats . The importance of daily physical activity and to reach a goal of at least 150 minutes of moderate to vigorous physical activity weekly (or  as directed by their physician) due to benefits such as increased musculature and improved lab values . Why you need complex carbohydrates: Whole grains and other complex carbohydrates are required to have a healthy diet. Whole grains provide fiber which can help with blood glucose levels and help keep you satiated. Fruits and starchy vegetables provide essential vitamins and minerals required for immune function, eyesight support, brain support, bone density, wound healing and many other functions within the body. According to the current evidenced based 2015-2020 Dietary Guidelines for Americans, complex carbohydrates are part of a healthy eating pattern which is associated with a decreased risk for type 2 diabetes, cancers, and cardiovascular disease.   Learning Style & Readiness for Change Teaching method utilized: Visual & Auditory  Demonstrated degree of understanding via: Teach Back  Barriers to learning/adherence to lifestyle change: barriers    MONITORING & EVALUATION Dietary intake, weekly physical activity, body weight  Next Steps Patient is to follow-up in 2 months

## 2019-09-12 DIAGNOSIS — Z83511 Family history of glaucoma: Secondary | ICD-10-CM | POA: Diagnosis not present

## 2019-09-12 DIAGNOSIS — H52223 Regular astigmatism, bilateral: Secondary | ICD-10-CM | POA: Diagnosis not present

## 2019-09-12 DIAGNOSIS — H40011 Open angle with borderline findings, low risk, right eye: Secondary | ICD-10-CM | POA: Diagnosis not present

## 2019-09-23 ENCOUNTER — Ambulatory Visit: Admission: EM | Admit: 2019-09-23 | Discharge: 2019-09-23 | Discharge status: Absent without leave | Payer: 59

## 2019-09-23 ENCOUNTER — Other Ambulatory Visit: Payer: Self-pay

## 2019-10-04 ENCOUNTER — Ambulatory Visit: Payer: 59 | Attending: Nurse Practitioner

## 2019-10-04 ENCOUNTER — Other Ambulatory Visit: Payer: Self-pay

## 2019-10-04 DIAGNOSIS — E559 Vitamin D deficiency, unspecified: Secondary | ICD-10-CM

## 2019-10-05 ENCOUNTER — Other Ambulatory Visit: Payer: Self-pay | Admitting: Nurse Practitioner

## 2019-10-05 LAB — VITAMIN D 25 HYDROXY (VIT D DEFICIENCY, FRACTURES): Vit D, 25-Hydroxy: 17.4 ng/mL — ABNORMAL LOW (ref 30.0–100.0)

## 2019-10-05 MED ORDER — VITAMIN D (ERGOCALCIFEROL) 1.25 MG (50000 UNIT) PO CAPS: 50000.0000 [IU] | ORAL_CAPSULE | ORAL | 0 refills | Status: DC

## 2019-10-10 ENCOUNTER — Ambulatory Visit: Payer: 59 | Admitting: Skilled Nursing Facility1

## 2019-10-27 ENCOUNTER — Other Ambulatory Visit: Payer: Self-pay | Admitting: Nurse Practitioner

## 2019-10-27 ENCOUNTER — Other Ambulatory Visit: Payer: Self-pay | Admitting: *Deleted

## 2019-10-27 DIAGNOSIS — Z202 Contact with and (suspected) exposure to infections with a predominantly sexual mode of transmission: Secondary | ICD-10-CM

## 2019-10-28 LAB — HIV ANTIBODY (ROUTINE TESTING W REFLEX): HIV Screen 4th Generation wRfx: NONREACTIVE

## 2019-10-28 LAB — RPR: RPR Ser Ql: NONREACTIVE

## 2019-10-31 LAB — SPECIMEN STATUS REPORT

## 2019-11-05 ENCOUNTER — Encounter: Payer: Self-pay | Admitting: Nurse Practitioner

## 2019-11-07 DIAGNOSIS — F411 Generalized anxiety disorder: Secondary | ICD-10-CM | POA: Diagnosis not present

## 2019-11-07 DIAGNOSIS — F332 Major depressive disorder, recurrent severe without psychotic features: Secondary | ICD-10-CM | POA: Diagnosis not present

## 2019-11-15 ENCOUNTER — Other Ambulatory Visit: Payer: 59

## 2019-11-15 ENCOUNTER — Other Ambulatory Visit: Payer: Self-pay

## 2019-11-15 DIAGNOSIS — Z113 Encounter for screening for infections with a predominantly sexual mode of transmission: Secondary | ICD-10-CM | POA: Diagnosis not present

## 2019-11-18 LAB — HSV(HERPES SMPLX)ABS-I+II(IGG+IGM)-BLD
HSV 1 Glycoprotein G Ab, IgG: 1.07 index — ABNORMAL HIGH (ref 0.00–0.90)
HSV 2 IgG, Type Spec: 1.66 index — ABNORMAL HIGH (ref 0.00–0.90)
HSVI/II Comb IgM: 0.91 Ratio — ABNORMAL HIGH (ref 0.00–0.90)

## 2019-11-18 LAB — HSV-2 IGG SUPPLEMENTAL TEST: HSV-2 IgG Supplemental Test: NEGATIVE

## 2019-12-02 DIAGNOSIS — F411 Generalized anxiety disorder: Secondary | ICD-10-CM | POA: Diagnosis not present

## 2019-12-02 DIAGNOSIS — F332 Major depressive disorder, recurrent severe without psychotic features: Secondary | ICD-10-CM | POA: Diagnosis not present

## 2019-12-20 ENCOUNTER — Other Ambulatory Visit (HOSPITAL_COMMUNITY)
Admission: RE | Admit: 2019-12-20 | Discharge: 2019-12-20 | Disposition: A | Discharge status: Home / Self Care | Payer: 59 | Source: Ambulatory Visit | Attending: Nurse Practitioner | Admitting: Nurse Practitioner

## 2019-12-20 ENCOUNTER — Other Ambulatory Visit: Payer: Self-pay

## 2019-12-20 DIAGNOSIS — Z202 Contact with and (suspected) exposure to infections with a predominantly sexual mode of transmission: Secondary | ICD-10-CM

## 2019-12-20 NOTE — Progress Notes (Signed)
Pt.'s came in and re-swab for her cervical ancillary test. Pt. Last specimen was not receive.

## 2019-12-21 ENCOUNTER — Other Ambulatory Visit: Payer: Self-pay | Admitting: Nurse Practitioner

## 2019-12-21 MED ORDER — AMOXICILLIN 500 MG PO CAPS: 500.0000 mg | ORAL_CAPSULE | Freq: Three times a day (TID) | ORAL | 0 refills | Status: AC

## 2019-12-22 ENCOUNTER — Other Ambulatory Visit: Payer: Self-pay | Admitting: Nurse Practitioner

## 2019-12-22 LAB — CERVICOVAGINAL ANCILLARY ONLY
Bacterial Vaginitis (gardnerella): POSITIVE — AB
Candida Glabrata: NEGATIVE
Candida Vaginitis: POSITIVE — AB
Chlamydia: POSITIVE — AB
Comment: NEGATIVE
Comment: NEGATIVE
Comment: NEGATIVE
Comment: NEGATIVE
Comment: NEGATIVE
Comment: NORMAL
Neisseria Gonorrhea: NEGATIVE
Trichomonas: NEGATIVE

## 2019-12-22 MED ORDER — FLUCONAZOLE 150 MG PO TABS
150.0000 mg | ORAL_TABLET | Freq: Once | ORAL | 0 refills | Status: AC
Start: 2019-12-22 — End: 2019-12-22

## 2019-12-22 MED ORDER — METRONIDAZOLE 500 MG PO TABS: 500.0000 mg | ORAL_TABLET | Freq: Two times a day (BID) | ORAL | 0 refills | Status: AC

## 2019-12-22 MED ORDER — AZITHROMYCIN 500 MG PO TABS: 1000.0000 mg | ORAL_TABLET | Freq: Once | ORAL | 0 refills | Status: AC

## 2020-01-12 DIAGNOSIS — Z319 Encounter for procreative management, unspecified: Secondary | ICD-10-CM | POA: Diagnosis not present

## 2020-01-12 DIAGNOSIS — N978 Female infertility of other origin: Secondary | ICD-10-CM | POA: Diagnosis not present

## 2020-01-27 DIAGNOSIS — F332 Major depressive disorder, recurrent severe without psychotic features: Secondary | ICD-10-CM | POA: Diagnosis not present

## 2020-01-27 DIAGNOSIS — F411 Generalized anxiety disorder: Secondary | ICD-10-CM | POA: Diagnosis not present

## 2020-01-30 ENCOUNTER — Encounter: Payer: Self-pay | Admitting: Emergency Medicine

## 2020-01-30 ENCOUNTER — Ambulatory Visit
Admission: EM | Admit: 2020-01-30 | Discharge: 2020-01-30 | Disposition: A | Discharge status: Home / Self Care | Payer: 59 | Attending: Physician Assistant | Admitting: Physician Assistant

## 2020-01-30 ENCOUNTER — Other Ambulatory Visit: Payer: Self-pay

## 2020-01-30 DIAGNOSIS — J029 Acute pharyngitis, unspecified: Secondary | ICD-10-CM | POA: Insufficient documentation

## 2020-01-30 DIAGNOSIS — R0982 Postnasal drip: Secondary | ICD-10-CM | POA: Diagnosis not present

## 2020-01-30 LAB — POCT RAPID STREP A (OFFICE): Rapid Strep A Screen: NEGATIVE

## 2020-01-30 MED ORDER — FLUTICASONE PROPIONATE 50 MCG/ACT NA SUSP
2.0000 | Freq: Every day | NASAL | 0 refills | Status: DC
Start: 2020-01-30 — End: 2021-05-01

## 2020-01-30 MED ORDER — LIDOCAINE VISCOUS HCL 2 % MT SOLN
15.0000 mL | OROMUCOSAL | 0 refills | Status: DC | PRN
Start: 2020-01-30 — End: 2021-05-01
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
## 2020-01-30 NOTE — Discharge Instructions (Signed)
Rapid strep negative. Start lidocaine for sore throat, do not eat or drink for the next 40 mins after use as it can stunt your gag reflex. Flonase nasal spray for nasal congestion/drainage. You can use over the counter nasal saline rinse such as neti pot for nasal congestion. Monitor for any worsening of symptoms, swelling of the throat, trouble breathing, trouble swallowing, leaning forward to breath, drooling, go to the emergency department for further evaluation needed.

## 2020-01-30 NOTE — ED Triage Notes (Signed)
Pt presents to [facility] for assessment of sore throat starting this morning with post-nasal drip.

## 2020-02-01 LAB — CULTURE, GROUP A STREP (THRC)

## 2020-02-09 ENCOUNTER — Telehealth: Payer: Self-pay | Admitting: Infectious Diseases

## 2020-02-09 ENCOUNTER — Ambulatory Visit (INDEPENDENT_AMBULATORY_CARE_PROVIDER_SITE_OTHER)
Admission: RE | Admit: 2020-02-09 | Discharge: 2020-02-09 | Disposition: A | Discharge status: Home / Self Care | Payer: 59 | Source: Ambulatory Visit

## 2020-02-09 ENCOUNTER — Other Ambulatory Visit: Payer: Self-pay | Admitting: Infectious Diseases

## 2020-02-09 DIAGNOSIS — R05 Cough: Secondary | ICD-10-CM

## 2020-02-09 DIAGNOSIS — R059 Cough, unspecified: Secondary | ICD-10-CM

## 2020-02-09 DIAGNOSIS — U071 COVID-19: Secondary | ICD-10-CM

## 2020-02-09 MED ORDER — BENZONATATE 100 MG PO CAPS
100.0000 mg | ORAL_CAPSULE | Freq: Three times a day (TID) | ORAL | 0 refills | Status: DC | PRN
Start: 2020-02-09 — End: 2021-05-01

## 2020-02-09 MED ORDER — SODIUM CHLORIDE 0.9 % IV SOLN
Freq: Once | INTRAVENOUS | Status: AC
  Filled 2020-02-09: qty 600
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
## 2020-02-09 NOTE — Progress Notes (Signed)
I connected by phone with Emily Frank on 02/09/2020 at 4:19 PM to discuss the potential use of a new treatment for mild to moderate COVID-19 viral infection in non-hospitalized patients.  This patient is a 29 y.o. female that meets the FDA criteria for Emergency Use Authorization of COVID monoclonal antibody casirivimab/imdevimab.  Has a (+) direct SARS-CoV-2 viral test result  Has mild or moderate COVID-19   Is NOT hospitalized due to COVID-19  Is within 10 days of symptom onset  Has at least one of the high risk factor(s) for progression to severe COVID-19 and/or hospitalization as defined in EUA.  Specific high risk criteria : BMI > 25   I have spoken and communicated the following to the patient or parent/caregiver regarding COVID monoclonal antibody treatment:  1. FDA has authorized the emergency use for the treatment of mild to moderate COVID-19 in adults and pediatric patients with positive results of direct SARS-CoV-2 viral testing who are 60 years of age and older weighing at least 40 kg, and who are at high risk for progressing to severe COVID-19 and/or hospitalization.  2. The significant known and potential risks and benefits of COVID monoclonal antibody, and the extent to which such potential risks and benefits are unknown.  3. Information on available alternative treatments and the risks and benefits of those alternatives, including clinical trials.  4. Patients treated with COVID monoclonal antibody should continue to self-isolate and use infection control measures (e.g., wear mask, isolate, social distance, avoid sharing personal items, clean and disinfect "high touch" surfaces, and frequent handwashing) according to CDC guidelines.   5. The patient or parent/caregiver has the option to accept or refuse COVID monoclonal antibody treatment.  After reviewing this information with the patient, The patient agreed to proceed with receiving casirivimab\imdevimab  infusion and will be provided a copy of the Fact sheet prior to receiving the infusion.   [name] 02/09/2020 4:19 PM

## 2020-02-09 NOTE — Telephone Encounter (Signed)
Called to discuss with patient about Covid symptoms and the use of regeneron, a monoclonal antibody infusion for those with mild to moderate Covid symptoms and at a high risk of hospitalization.  Pt is qualified for this infusion at the [facility] infusion center due to BMI>35   Sx of nasal congestion for a little over a week and fever onset the last 48 hours.   Scheduled for 7/30 AM and details discussed re: infusion.    [name], MSN, NP-C [facility]  [email or web address] Pager: [phone] Office: [phone] [facility] Main Line: [phone]

## 2020-02-09 NOTE — Discharge Instructions (Addendum)
Take the [facility] as needed for cough.    Continue to self quarantine until your COVID test result is back.    Come to the urgent care to be seen in person if your symptoms are not improving.

## 2020-02-10 ENCOUNTER — Ambulatory Visit (HOSPITAL_COMMUNITY)
Admission: RE | Admit: 2020-02-10 | Discharge: 2020-02-10 | Disposition: A | Discharge status: Home / Self Care | Payer: 59 | Source: Ambulatory Visit | Attending: Pulmonary Disease | Admitting: Pulmonary Disease

## 2020-02-10 DIAGNOSIS — U071 COVID-19: Secondary | ICD-10-CM | POA: Insufficient documentation

## 2020-02-10 MED ORDER — ALBUTEROL SULFATE HFA 108 (90 BASE) MCG/ACT IN AERS: 2.0000 | INHALATION_SPRAY | Freq: Once | RESPIRATORY_TRACT | Status: DC | PRN

## 2020-02-10 MED ORDER — DIPHENHYDRAMINE HCL 50 MG/ML IJ SOLN: 50.0000 mg | Freq: Once | INTRAMUSCULAR | Status: DC | PRN

## 2020-02-10 MED ORDER — EPINEPHRINE 0.3 MG/0.3ML IJ SOAJ: 0.3000 mg | Freq: Once | INTRAMUSCULAR | Status: DC | PRN

## 2020-02-10 MED ORDER — FAMOTIDINE IN NACL 20-0.9 MG/50ML-% IV SOLN: 20.0000 mg | Freq: Once | INTRAVENOUS | Status: DC | PRN

## 2020-02-10 MED ORDER — SODIUM CHLORIDE 0.9 % IV SOLN: INTRAVENOUS | Status: DC | PRN

## 2020-02-10 MED ORDER — METHYLPREDNISOLONE SODIUM SUCC 125 MG IJ SOLR: 125.0000 mg | Freq: Once | INTRAMUSCULAR | Status: DC | PRN

## 2020-02-10 NOTE — Discharge Instructions (Signed)

## 2020-02-10 NOTE — Progress Notes (Signed)
  Diagnosis: COVID-19  Physician:Dr Wright   Procedure: Covid Infusion Clinic Med: casirivimab\imdevimab infusion - Provided patient with casirivimab\imdevimab fact sheet for patients, parents and caregivers prior to infusion.  Complications: No immediate complications noted.  Discharge: Discharged home   Emily Frank 02/10/2020  

## 2020-02-14 ENCOUNTER — Other Ambulatory Visit: Payer: Self-pay

## 2020-02-14 ENCOUNTER — Encounter (HOSPITAL_BASED_OUTPATIENT_CLINIC_OR_DEPARTMENT_OTHER): Payer: Self-pay

## 2020-02-14 ENCOUNTER — Emergency Department (HOSPITAL_BASED_OUTPATIENT_CLINIC_OR_DEPARTMENT_OTHER)
Admission: EM | Admit: 2020-02-14 | Discharge: 2020-02-15 | Disposition: A | Discharge status: Home / Self Care | Payer: 59 | Source: Home / Self Care | Attending: Emergency Medicine | Admitting: Emergency Medicine

## 2020-02-14 ENCOUNTER — Emergency Department (HOSPITAL_COMMUNITY)
Admission: EM | Admit: 2020-02-14 | Discharge: 2020-02-14 | Disposition: A | Discharge status: Home / Self Care | Payer: 59 | Attending: Emergency Medicine | Admitting: Emergency Medicine

## 2020-02-14 ENCOUNTER — Encounter (HOSPITAL_COMMUNITY): Payer: Self-pay

## 2020-02-14 DIAGNOSIS — R519 Headache, unspecified: Secondary | ICD-10-CM | POA: Insufficient documentation

## 2020-02-14 DIAGNOSIS — U071 COVID-19: Secondary | ICD-10-CM | POA: Diagnosis not present

## 2020-02-14 DIAGNOSIS — R0981 Nasal congestion: Secondary | ICD-10-CM

## 2020-02-14 DIAGNOSIS — R0989 Other specified symptoms and signs involving the circulatory and respiratory systems: Secondary | ICD-10-CM | POA: Insufficient documentation

## 2020-02-14 DIAGNOSIS — Z5321 Procedure and treatment not carried out due to patient leaving prior to being seen by health care provider: Secondary | ICD-10-CM | POA: Diagnosis not present

## 2020-02-14 NOTE — ED Triage Notes (Signed)
Pt c/o HA-reports +covid 7/19-NAD-steady gait

## 2020-02-14 NOTE — ED Triage Notes (Signed)
Patient reports that she was diagnosed with Covid  X 5 days ago. Patient c/o headache, fever.

## 2020-02-14 NOTE — ED Notes (Signed)
Has HA over Rt eye and radiates downward to nose and mouth, has been taking tylenol without relief. Onset of HA last PM, slow onset, some light sensitive, had some Nausea and Vomited x 1. Feels like she is dehydrated.

## 2020-02-15 DIAGNOSIS — Z5321 Procedure and treatment not carried out due to patient leaving prior to being seen by health care provider: Secondary | ICD-10-CM | POA: Diagnosis not present

## 2020-02-15 DIAGNOSIS — U071 COVID-19: Secondary | ICD-10-CM | POA: Diagnosis not present

## 2020-02-15 LAB — PREGNANCY, URINE: Preg Test, Ur: NEGATIVE

## 2020-02-15 MED ORDER — KETOROLAC TROMETHAMINE 15 MG/ML IJ SOLN
15.0000 mg | Freq: Once | INTRAMUSCULAR | Status: AC
  Administered 2020-02-15: 15 mg via INTRAVENOUS
  Filled 2020-02-15: qty 1

## 2020-02-15 MED ORDER — OXYMETAZOLINE HCL 0.05 % NA SOLN
2.0000 | Freq: Two times a day (BID) | NASAL | Status: DC | PRN
  Administered 2020-02-15: 2 via NASAL
  Filled 2020-02-15: qty 30

## 2020-02-15 MED ORDER — DIPHENHYDRAMINE HCL 50 MG/ML IJ SOLN
25.0000 mg | Freq: Once | INTRAMUSCULAR | Status: AC
  Administered 2020-02-15: 25 mg via INTRAVENOUS
  Filled 2020-02-15: qty 1

## 2020-02-15 MED ORDER — METOCLOPRAMIDE HCL 5 MG/ML IJ SOLN
10.0000 mg | Freq: Once | INTRAMUSCULAR | Status: AC
  Administered 2020-02-15: 10 mg via INTRAVENOUS
  Filled 2020-02-15: qty 2

## 2020-02-15 MED ORDER — SODIUM CHLORIDE 0.9 % IV BOLUS
1000.0000 mL | Freq: Once | INTRAVENOUS | Status: AC
  Administered 2020-02-15: 1000 mL via INTRAVENOUS
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
## 2020-02-15 NOTE — ED Notes (Signed)
PT denies headache, PO fluids given. Denies nausea.

## 2020-03-23 DIAGNOSIS — F332 Major depressive disorder, recurrent severe without psychotic features: Secondary | ICD-10-CM | POA: Diagnosis not present

## 2020-03-23 DIAGNOSIS — F411 Generalized anxiety disorder: Secondary | ICD-10-CM | POA: Diagnosis not present

## 2020-03-30 ENCOUNTER — Other Ambulatory Visit (HOSPITAL_COMMUNITY): Payer: Self-pay | Admitting: Psychiatry

## 2020-04-04 ENCOUNTER — Encounter: Payer: 59 | Attending: Surgery | Admitting: Skilled Nursing Facility1

## 2020-04-04 ENCOUNTER — Other Ambulatory Visit: Payer: Self-pay

## 2020-04-04 DIAGNOSIS — Z713 Dietary counseling and surveillance: Secondary | ICD-10-CM | POA: Insufficient documentation

## 2020-04-04 DIAGNOSIS — E669 Obesity, unspecified: Secondary | ICD-10-CM | POA: Diagnosis not present
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
## 2020-04-10 ENCOUNTER — Other Ambulatory Visit: Payer: Self-pay

## 2020-04-10 ENCOUNTER — Other Ambulatory Visit (HOSPITAL_COMMUNITY)
Admission: RE | Admit: 2020-04-10 | Discharge: 2020-04-10 | Disposition: A | Discharge status: Home / Self Care | Payer: 59 | Source: Ambulatory Visit | Attending: Nurse Practitioner | Admitting: Nurse Practitioner

## 2020-04-10 ENCOUNTER — Other Ambulatory Visit: Payer: Self-pay | Admitting: Nurse Practitioner

## 2020-04-10 ENCOUNTER — Other Ambulatory Visit: Payer: 59

## 2020-04-10 DIAGNOSIS — N760 Acute vaginitis: Secondary | ICD-10-CM | POA: Insufficient documentation

## 2020-04-11 ENCOUNTER — Encounter: Payer: Self-pay | Admitting: Dietician

## 2020-04-11 ENCOUNTER — Encounter: Payer: 59 | Admitting: Dietician

## 2020-04-11 ENCOUNTER — Other Ambulatory Visit: Payer: Self-pay | Admitting: Nurse Practitioner

## 2020-04-11 DIAGNOSIS — Z713 Dietary counseling and surveillance: Secondary | ICD-10-CM

## 2020-04-11 LAB — CERVICOVAGINAL ANCILLARY ONLY
Bacterial Vaginitis (gardnerella): NEGATIVE
Candida Glabrata: NEGATIVE
Candida Vaginitis: POSITIVE — AB
Chlamydia: NEGATIVE
Comment: NEGATIVE
Comment: NEGATIVE
Comment: NEGATIVE
Comment: NEGATIVE
Comment: NEGATIVE
Comment: NORMAL
Neisseria Gonorrhea: NEGATIVE
Trichomonas: NEGATIVE

## 2020-04-11 MED ORDER — FLUCONAZOLE 150 MG PO TABS: 150.0000 mg | ORAL_TABLET | Freq: Once | ORAL | 0 refills | Status: AC

## 2020-04-11 NOTE — Progress Notes (Signed)
Nutrition Follow-Up Visit for Bariatric Surgery Medical Nutrition Therapy  Appt Start Time: 11:00am  End Time: 11:30am  2 Months Post-Operative RYGB Surgery Surgery Date: 03/08/2019   MyChart Visit This visit was completed via telephone due to the COVID-19 pandemic.   I spoke with Emily Frank and verified that I was speaking with the correct person with two patient identifiers (full name and date of birth).   I discussed the limitations related to this kind of visit and the patient is willing to proceed.  EID# [id number]   NUTRITION ASSESSMENT  Anthropometrics  Start weight at [facility]: 326.5 lbs (02/10/2018) Today's weight: N/A (MyChart Visit)  Body Composition Scale 03/23/2019 05/10/2019 06/28/2019 08/29/2019 04/04/2020  Total Body Fat % 48 47 45.6 43.6 43.8  Visceral Fat 15 14 13 12 12   Fat-Free Mass % 51.9 52.9 54.3 56.3 56.1   Total Body Water % 40.4 40.9 41.6 46.2 42.5   Muscle-Mass lbs 33.6 33.5 33.3 33.1 33.1  Body Fat Displacement              Torso  lbs 88 82.6 75 66.0 67         Left Leg  lbs 17.6 16.5 15 13.2 13.4         Right Leg  lbs 17.6 16.5 15 13.2 13.4         Left Arm  lbs 8.8 8.2 7.5 6.6 6.7         Right Arm   lbs 8.8 8.2 7.5 6.6 6.7     Lifestyle & Dietary Hx Patient states she has increased her daily water intake and is drinking between 5-6 bottles of water daily. States she has tried home workout videos and plans to go back to the gym soon. Reports constipation is relieved.   Estimated daily fluid intake: 64+ oz Estimated daily protein intake: 60+ g Supplements: bariatric MVI, calcium   Current average weekly physical activity: home workout videos, going to start back at the gym   24-Hr Dietary Recall First Meal: egg + bacon  Snack: kettle corn Second Meal: BBQ chicken + coleslaw  Snack: skinny pop popcorn Third Meal: hamburger steak + corn + collard greens  Snack: - Beverages: water, unsweet tea  Post-Op Goals/ Signs/ Symptoms Using straws:  no Drinking while eating: no Chewing/swallowing difficulties: no Changes in vision: no Changes to mood/headaches: no Hair loss/changes to skin/nails: no Difficulty focusing/concentrating: no Sweating: no Dizziness/lightheadedness: no Palpitations: no  Carbonated/caffeinated beverages: no N/V/D/C/Gas: no Abdominal pain: no Dumping syndrome: no    NUTRITION DIAGNOSIS  Overweight/obesity ([place]-3.3) related to past poor dietary habits and physical inactivity as evidenced by completed bariatric surgery and following dietary guidelines for continued weight loss and healthy nutrition status.     NUTRITION INTERVENTION Nutrition counseling (C-1) and education (E-2) to facilitate bariatric surgery goals, including: . Diet advancement to the next phase (phase non starchy vegetables) no . The importance of consuming adequate calories as well as certain nutrients daily due to the body's need for essential vitamins, minerals, and fats . The importance of daily physical activity and to reach a goal of at least 150 minutes of moderate to vigorous physical activity weekly (or as directed by their physician) due to benefits such as increased musculature and improved lab values Importance of vegetables: To have an overall healthy diet, adult men and women are recommended to consume anywhere from 2-3 cups of vegetables daily. Vegetables provide a wide range of vitamins and minerals such as vitamin A,  vitamin C, potassium, and folic acid. According to the [employer], including fruit and vegetables daily may reduce the risk of cardiovascular disease, certain cancers, and other non-communicable diseases.  Learning Style & Readiness for Change Teaching method utilized: Visual & Auditory  Demonstrated degree of understanding via: Teach Back  Barriers to learning/adherence to lifestyle change: None Identified     MONITORING & EVALUATION Dietary intake, weekly physical activity, body weight in 2  months.   Next Steps Patient is to follow-up in 2 months.

## 2020-06-06 ENCOUNTER — Encounter: Payer: 59 | Attending: Surgery | Admitting: Skilled Nursing Facility1

## 2020-06-06 DIAGNOSIS — E669 Obesity, unspecified: Secondary | ICD-10-CM | POA: Insufficient documentation

## 2020-06-06 DIAGNOSIS — Z713 Dietary counseling and surveillance: Secondary | ICD-10-CM | POA: Insufficient documentation

## 2020-07-11 ENCOUNTER — Ambulatory Visit: Payer: 59 | Admitting: Podiatry

## 2020-07-18 ENCOUNTER — Encounter (INDEPENDENT_AMBULATORY_CARE_PROVIDER_SITE_OTHER): Payer: Self-pay

## 2020-08-03 ENCOUNTER — Ambulatory Visit: Payer: 59 | Admitting: Family

## 2020-08-23 ENCOUNTER — Ambulatory Visit: Payer: 59 | Admitting: Internal Medicine

## 2020-08-23 DIAGNOSIS — Z0289 Encounter for other administrative examinations: Secondary | ICD-10-CM

## 2020-09-27 ENCOUNTER — Encounter (HOSPITAL_COMMUNITY): Payer: Self-pay | Admitting: *Deleted

## 2020-10-22 ENCOUNTER — Other Ambulatory Visit (HOSPITAL_COMMUNITY): Payer: Self-pay

## 2020-10-22 ENCOUNTER — Other Ambulatory Visit: Payer: Self-pay | Admitting: Nurse Practitioner

## 2020-10-22 ENCOUNTER — Telehealth: Payer: Self-pay | Admitting: Nurse Practitioner

## 2020-10-22 MED ORDER — CYCLOBENZAPRINE HCL 5 MG PO TABS
5.0000 mg | ORAL_TABLET | Freq: Three times a day (TID) | ORAL | 1 refills | Status: DC | PRN
  Filled 2020-10-22: qty 45, 15d supply, fill #0

## 2020-10-22 NOTE — Telephone Encounter (Signed)
Provider Zelda working virtual. Left Pt vm to call [phone] to reschedule appt.

## 2020-10-23 ENCOUNTER — Encounter: Payer: 59 | Admitting: Nurse Practitioner

## 2020-10-24 ENCOUNTER — Other Ambulatory Visit: Payer: Self-pay | Admitting: Nurse Practitioner

## 2020-10-24 ENCOUNTER — Other Ambulatory Visit (HOSPITAL_COMMUNITY): Payer: Self-pay

## 2020-10-24 MED ORDER — PREDNISONE 10 MG PO TABS
10.0000 mg | ORAL_TABLET | Freq: Every day | ORAL | 0 refills | Status: AC
  Filled 2020-10-24: qty 5, 5d supply, fill #0

## 2020-11-05 ENCOUNTER — Other Ambulatory Visit: Payer: Self-pay | Admitting: Nurse Practitioner

## 2020-11-05 ENCOUNTER — Other Ambulatory Visit (HOSPITAL_COMMUNITY): Payer: Self-pay

## 2020-11-05 MED ORDER — FLUCONAZOLE 150 MG PO TABS
150.0000 mg | ORAL_TABLET | Freq: Every day | ORAL | 0 refills | Status: DC
Start: 2020-11-05 — End: 2021-02-06
  Filled 2020-11-05: qty 2, 2d supply, fill #0

## 2021-01-23 ENCOUNTER — Ambulatory Visit: Payer: 59 | Admitting: Family Medicine

## 2021-02-06 ENCOUNTER — Telehealth: Payer: No Typology Code available for payment source | Admitting: Family Medicine

## 2021-02-06 DIAGNOSIS — N760 Acute vaginitis: Secondary | ICD-10-CM | POA: Diagnosis not present

## 2021-02-06 MED ORDER — FLUCONAZOLE 150 MG PO TABS: 150.0000 mg | ORAL_TABLET | Freq: Every day | ORAL | 0 refills | Status: DC

## 2021-02-06 MED ORDER — METRONIDAZOLE 500 MG PO TABS: 500.0000 mg | ORAL_TABLET | Freq: Two times a day (BID) | ORAL | 0 refills | Status: AC

## 2021-02-06 NOTE — Progress Notes (Signed)
E-Visit for Vaginal Symptoms  We are sorry that you are not feeling well. Here is how we plan to help! Based on what you shared with me it looks like you: May have a vaginosis due to bacteria  Vaginosis is an inflammation of the vagina that can result in discharge, itching and pain. The cause is usually a change in the normal balance of vaginal bacteria or an infection. Vaginosis can also result from reduced estrogen levels after menopause.  The most common causes of vaginosis are:   Bacterial vaginosis which results from an overgrowth of one on several organisms that are normally present in your vagina.   Yeast infections which are caused by a naturally occurring fungus called candida.   Vaginal atrophy (atrophic vaginosis) which results from the thinning of the vagina from reduced estrogen levels after menopause.   Trichomoniasis which is caused by a parasite and is commonly transmitted by sexual intercourse.  Factors that increase your risk of developing vaginosis include: Medications, such as antibiotics and steroids Uncontrolled diabetes Use of hygiene products such as bubble bath, vaginal spray or vaginal deodorant Douching Wearing damp or tight-fitting clothing Using an intrauterine device (IUD) for birth control Hormonal changes, such as those associated with pregnancy, birth control pills or menopause Sexual activity Having a sexually transmitted infection  Your treatment plan is Metronidazole or Flagyl 500mg twice a day for 7 days.  I have electronically sent this prescription into the pharmacy that you have chosen.  Be sure to take all of the medication as directed. Stop taking any medication if you develop a rash, tongue swelling or shortness of breath. Mothers who are breast feeding should consider pumping and discarding their breast milk while on these antibiotics. However, there is no consensus that infant exposure at these doses would be harmful.  Remember that  medication creams can weaken latex condoms. .   HOME CARE:  Good hygiene may prevent some types of vaginosis from recurring and may relieve some symptoms:  Avoid baths, hot tubs and whirlpool spas. Rinse soap from your outer genital area after a shower, and dry the area well to prevent irritation. Don't use scented or harsh soaps, such as those with deodorant or antibacterial action. Avoid irritants. These include scented tampons and pads. Wipe from front to back after using the toilet. Doing so avoids spreading fecal bacteria to your vagina.  Other things that may help prevent vaginosis include:  Don't douche. Your vagina doesn't require cleansing other than normal bathing. Repetitive douching disrupts the normal organisms that reside in the vagina and can actually increase your risk of vaginal infection. Douching won't clear up a vaginal infection. Use a latex condom. Both female and female latex condoms may help you avoid infections spread by sexual contact. Wear cotton underwear. Also wear pantyhose with a cotton crotch. If you feel comfortable without it, skip wearing underwear to bed. Yeast thrives in moist environments Your symptoms should improve in the next day or two.  GET HELP RIGHT AWAY IF:  You have pain in your lower abdomen ( pelvic area or over your ovaries) You develop nausea or vomiting You develop a fever Your discharge changes or worsens You have persistent pain with intercourse You develop shortness of breath, a rapid pulse, or you faint.  These symptoms could be signs of problems or infections that need to be evaluated by a medical provider now.  MAKE SURE YOU   Understand these instructions. Will watch your condition. Will get help right   away if you are not doing well or get worse.  Thank you for choosing an e-visit.  Your e-visit answers were reviewed by a board certified advanced clinical practitioner to complete your personal care plan. Depending upon the  condition, your plan could have included both over the counter or prescription medications.  Please review your pharmacy choice. Make sure the pharmacy is open so you can pick up prescription now. If there is a problem, you may contact your provider through [employer] and have the prescription routed to another pharmacy.  Your safety is important to [place]. If you have drug allergies check your prescription carefully.   For the next 24 hours you can use MyChart to ask questions about today's visit, request a non-urgent call back, or ask for a work or school excuse. You will get an email in the next two days asking about your experience. I hope that your e-visit has been valuable and will speed your recovery.   I provided 10 minutes of non face-to-face time during this encounter for chart review, medication and order placement, as well as and documentation.

## 2021-03-14 ENCOUNTER — Encounter: Payer: Self-pay | Admitting: Physician Assistant

## 2021-03-14 ENCOUNTER — Other Ambulatory Visit (HOSPITAL_COMMUNITY)
Admission: RE | Admit: 2021-03-14 | Discharge: 2021-03-14 | Disposition: A | Discharge status: Home / Self Care | Payer: No Typology Code available for payment source | Source: Ambulatory Visit | Attending: Physician Assistant | Admitting: Physician Assistant

## 2021-03-14 ENCOUNTER — Ambulatory Visit: Payer: No Typology Code available for payment source | Attending: Physician Assistant | Admitting: Physician Assistant

## 2021-03-14 ENCOUNTER — Other Ambulatory Visit: Payer: Self-pay

## 2021-03-14 DIAGNOSIS — Z113 Encounter for screening for infections with a predominantly sexual mode of transmission: Secondary | ICD-10-CM | POA: Diagnosis present

## 2021-03-14 NOTE — Progress Notes (Signed)
Virtual Visit via Telephone Note  I connected with Emily Frank on 03/14/21 at 10:50 AM EDT by telephone and verified that I am speaking with the correct person using two identifiers.  Location: Patient: home Provider: [facility] office   I discussed the limitations, risks, security and privacy concerns of performing an evaluation and management service by telephone and the availability of in person appointments. I also discussed with the patient that there may be a patient responsible charge related to this service. The patient expressed understanding and agreed to proceed.   History of Present Illness:  patient is wanting to get STI screening done.  No s/sx.  She is starting a new serious relationship and wants to be screened.  No vaginal d/c or fevers.  No pelvic pain.      Observations/Objective:  NAd.  A&OX3   Assessment and Plan: 1. Routine screening for STI (sexually transmitted infection) Safe sex practices reviewed - Cervicovaginal ancillary only - RPR - HIV antibody (with reflex)    Follow Up Instructions: prn   I discussed the assessment and treatment plan with the patient. The patient was provided an opportunity to ask questions and all were answered. The patient agreed with the plan and demonstrated an understanding of the instructions.   The patient was advised to call back or seek an in-person evaluation if the symptoms worsen or if the condition fails to improve as anticipated.  I provided 7 minutes of non-face-to-face time during this encounter.   Georgian Co, PA-C  Patient ID: Emily Frank, female   DOB: 1990-09-23, 30 y.o.   MRN: [id number]

## 2021-03-15 LAB — CERVICOVAGINAL ANCILLARY ONLY
Bacterial Vaginitis (gardnerella): NEGATIVE
Candida Glabrata: NEGATIVE
Candida Vaginitis: NEGATIVE
Chlamydia: NEGATIVE
Comment: NEGATIVE
Comment: NEGATIVE
Comment: NEGATIVE
Comment: NEGATIVE
Comment: NEGATIVE
Comment: NORMAL
Neisseria Gonorrhea: NEGATIVE
Trichomonas: NEGATIVE

## 2021-03-15 LAB — HIV ANTIBODY (ROUTINE TESTING W REFLEX): HIV Screen 4th Generation wRfx: NONREACTIVE

## 2021-03-15 LAB — RPR: RPR Ser Ql: NONREACTIVE

## 2021-04-12 ENCOUNTER — Telehealth: Payer: No Typology Code available for payment source | Admitting: Nurse Practitioner

## 2021-04-12 DIAGNOSIS — N760 Acute vaginitis: Secondary | ICD-10-CM | POA: Diagnosis not present

## 2021-04-13 MED ORDER — FLUCONAZOLE 150 MG PO TABS: 150.0000 mg | ORAL_TABLET | Freq: Once | ORAL | 0 refills | Status: AC

## 2021-04-13 NOTE — Progress Notes (Signed)

## 2021-04-29 ENCOUNTER — Ambulatory Visit: Payer: No Typology Code available for payment source | Admitting: Nurse Practitioner

## 2021-05-01 ENCOUNTER — Encounter: Payer: Self-pay | Admitting: Nurse Practitioner

## 2021-05-01 ENCOUNTER — Other Ambulatory Visit: Payer: Self-pay

## 2021-05-01 ENCOUNTER — Ambulatory Visit: Payer: No Typology Code available for payment source | Attending: Nurse Practitioner | Admitting: Nurse Practitioner

## 2021-05-01 DIAGNOSIS — E875 Hyperkalemia: Secondary | ICD-10-CM | POA: Diagnosis not present

## 2021-05-01 DIAGNOSIS — E559 Vitamin D deficiency, unspecified: Secondary | ICD-10-CM

## 2021-05-01 DIAGNOSIS — R5383 Other fatigue: Secondary | ICD-10-CM
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
## 2021-05-07 ENCOUNTER — Telehealth: Payer: No Typology Code available for payment source | Admitting: Nurse Practitioner

## 2021-05-15 ENCOUNTER — Ambulatory Visit: Payer: No Typology Code available for payment source | Admitting: Nurse Practitioner

## 2021-06-03 ENCOUNTER — Encounter: Payer: Self-pay | Admitting: Emergency Medicine

## 2021-06-03 ENCOUNTER — Ambulatory Visit
Admission: EM | Admit: 2021-06-03 | Discharge: 2021-06-03 | Disposition: A | Discharge status: Home / Self Care | Payer: No Typology Code available for payment source | Attending: Internal Medicine | Admitting: Internal Medicine

## 2021-06-03 ENCOUNTER — Other Ambulatory Visit: Payer: Self-pay

## 2021-06-03 ENCOUNTER — Ambulatory Visit: Payer: No Typology Code available for payment source | Admitting: Physician Assistant

## 2021-06-03 DIAGNOSIS — M25561 Pain in right knee: Secondary | ICD-10-CM

## 2021-06-03 MED ORDER — MELOXICAM 7.5 MG PO TABS
7.5000 mg | ORAL_TABLET | Freq: Every day | ORAL | 0 refills | Status: DC
Start: 2021-06-03 — End: 2021-07-23

## 2021-06-03 NOTE — Discharge Instructions (Addendum)
If the swelling gets worse, follow up with ortho

## 2021-06-03 NOTE — ED Triage Notes (Signed)
Right knee pain x 2 days, denies injury
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
## 2021-06-24 ENCOUNTER — Ambulatory Visit: Payer: No Typology Code available for payment source | Admitting: Nurse Practitioner

## 2021-07-06 ENCOUNTER — Telehealth: Payer: No Typology Code available for payment source | Admitting: Nurse Practitioner

## 2021-07-06 DIAGNOSIS — B9689 Other specified bacterial agents as the cause of diseases classified elsewhere: Secondary | ICD-10-CM

## 2021-07-06 DIAGNOSIS — N760 Acute vaginitis: Secondary | ICD-10-CM

## 2021-07-06 MED ORDER — METRONIDAZOLE 500 MG PO TABS: 500.0000 mg | ORAL_TABLET | Freq: Two times a day (BID) | ORAL | 0 refills | Status: AC

## 2021-07-06 NOTE — Progress Notes (Signed)
Virtual Visit Consent   Emily Frank, you are scheduled for a virtual visit with a [facility] provider today.     Just as with appointments in the office, your consent must be obtained to participate.  Your consent will be active for this visit and any virtual visit you may have with one of our providers in the next 365 days.     If you have a MyChart account, a copy of this consent can be sent to you electronically.  All virtual visits are billed to your insurance company just like a traditional visit in the office.    As this is a virtual visit, video technology does not allow for your provider to perform a traditional examination.  This may limit your provider's ability to fully assess your condition.  If your provider identifies any concerns that need to be evaluated in person or the need to arrange testing (such as labs, EKG, etc.), we will make arrangements to do so.     Although advances in technology are sophisticated, we cannot ensure that it will always work on either your end or our end.  If the connection with a video visit is poor, the visit may have to be switched to a telephone visit.  With either a video or telephone visit, we are not always able to ensure that we have a secure connection.     I need to obtain your verbal consent now.   Are you willing to proceed with your visit today?    [name] has provided verbal consent on 07/06/2021 for a virtual visit (video or telephone).   Viviano Simas, FNP   Date: 07/06/2021 9:47 AM   Virtual Visit via Video Note   I, Viviano Simas, connected with  Gloriajean Okun  ([id number], 05-15-1991) on 07/06/21 at 10:00 AM EST by a video-enabled telemedicine application and verified that I am speaking with the correct person using two identifiers.  Location: Patient: Virtual Visit Location Patient: Home Provider: Virtual Visit Location Provider: Home Office   I discussed the limitations of evaluation  and management by telemedicine and the availability of in person appointments. The patient expressed understanding and agreed to proceed.    History of Present Illness: Emily Frank is a 30 y.o. who identifies as a female who was assigned female at birth, and is being seen today for complaints of vaginal odor and itching. This happened after she used NAIR in place of waxing.   She has had BV in the past aware of symptoms.   Was treated in July successfully ad tolerated Flagyl well  Problems:  Patient Active Problem List   Diagnosis Date Noted   Gastric bypass status for obesity August 2020 03/08/2019   MDD (major depressive disorder), recurrent episode, severe (HCC) 07/23/2018   Panic attacks 02/23/2018   B12 deficiency 12/13/2017   Insulin resistance 12/13/2017   Depression 10/23/2017   PCOS (polycystic ovarian syndrome) 04/29/2017   Morbid obesity (HCC) 04/29/2017    Allergies: No Known Allergies Medications:  Current Outpatient Medications:    meloxicam (MOBIC) 7.5 MG tablet, Take 1 tablet (7.5 mg total) by mouth daily., Disp: 14 tablet, Rfl: 0  Observations/Objective: Patient is well-developed, well-nourished in no acute distress.  Resting comfortably at home.  Head is normocephalic, atraumatic.  No labored breathing.  Speech is clear and coherent with logical content.  Patient is alert and oriented at baseline.    Assessment and Plan: 1. BV (bacterial vaginosis)  -  metroNIDAZOLE (FLAGYL) 500 MG tablet; Take 1 tablet (500 mg total) by mouth 2 (two) times daily for 7 days.  Dispense: 14 tablet; Refill: 0     Follow Up Instructions: I discussed the assessment and treatment plan with the patient. The patient was provided an opportunity to ask questions and all were answered. The patient agreed with the plan and demonstrated an understanding of the instructions.  A copy of instructions were sent to the patient via MyChart unless otherwise noted below.     The  patient was advised to call back or seek an in-person evaluation if the symptoms worsen or if the condition fails to improve as anticipated.  Time:  I spent 10 minutes with the patient via telehealth technology discussing the above problems/concerns.    Viviano Simas, FNP

## 2021-07-23 ENCOUNTER — Telehealth: Payer: Self-pay | Admitting: Internal Medicine

## 2021-07-23 ENCOUNTER — Ambulatory Visit
Admission: EM | Admit: 2021-07-23 | Discharge: 2021-07-23 | Disposition: A | Discharge status: Home / Self Care | Payer: No Typology Code available for payment source | Attending: Emergency Medicine | Admitting: Emergency Medicine

## 2021-07-23 ENCOUNTER — Other Ambulatory Visit: Payer: Self-pay

## 2021-07-23 DIAGNOSIS — Z20822 Contact with and (suspected) exposure to covid-19: Secondary | ICD-10-CM | POA: Insufficient documentation

## 2021-07-23 DIAGNOSIS — M545 Low back pain, unspecified: Secondary | ICD-10-CM | POA: Diagnosis present

## 2021-07-23 LAB — RESP PANEL BY RT-PCR (FLU A&B, COVID) ARPGX2
Influenza A by PCR: NEGATIVE
Influenza B by PCR: NEGATIVE
SARS Coronavirus 2 by RT PCR: POSITIVE — AB

## 2021-07-23 LAB — URINALYSIS, COMPLETE (UACMP) WITH MICROSCOPIC
Bilirubin Urine: NEGATIVE
Glucose, UA: NEGATIVE mg/dL
Hgb urine dipstick: NEGATIVE
Ketones, ur: NEGATIVE mg/dL
Leukocytes,Ua: NEGATIVE
Nitrite: NEGATIVE
Protein, ur: NEGATIVE mg/dL
Specific Gravity, Urine: 1.02 (ref 1.005–1.030)
pH: 6.5 (ref 5.0–8.0)

## 2021-07-23 MED ORDER — MOLNUPIRAVIR EUA 200MG CAPSULE: 4.0000 | ORAL_CAPSULE | Freq: Two times a day (BID) | ORAL | 0 refills | Status: AC

## 2021-07-23 MED ORDER — KETOROLAC TROMETHAMINE 60 MG/2ML IM SOLN
60.0000 mg | Freq: Once | INTRAMUSCULAR | Status: AC
  Administered 2021-07-23: 60 mg via INTRAMUSCULAR

## 2021-07-23 MED ORDER — KETOROLAC TROMETHAMINE 10 MG PO TABS: 10.0000 mg | ORAL_TABLET | Freq: Four times a day (QID) | ORAL | 0 refills | Status: DC | PRN

## 2021-07-23 NOTE — Telephone Encounter (Signed)
I called pt and informed her that her Covid test is positive, flu tests are neg. Due to her BMI being > 30 I sent Molnupiravir. She was told she needs to quarantine for 5 days, and then needs to wear a mask for 5 days in public.

## 2021-07-23 NOTE — Discharge Instructions (Addendum)
You may Take Tylenol up to 1000 mg every 6 hours today, and start the Toradol tomorrow I will call you with the results tonight.

## 2021-07-23 NOTE — ED Triage Notes (Addendum)
Pt states she felt fine when she went to bed last night. Awoke today with feeling lightheaded, chills and sides/low back hurting. Pain comes and goes, but denies pain in triage
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
## 2021-07-24 LAB — URINE CULTURE

## 2021-10-04 ENCOUNTER — Encounter: Payer: Self-pay | Admitting: *Deleted

## 2022-03-12 DIAGNOSIS — Z3A22 22 weeks gestation of pregnancy: Secondary | ICD-10-CM | POA: Diagnosis not present

## 2022-03-12 DIAGNOSIS — O9229 Other disorders of breast associated with pregnancy and the puerperium: Secondary | ICD-10-CM | POA: Diagnosis not present

## 2022-03-12 DIAGNOSIS — Z3A24 24 weeks gestation of pregnancy: Secondary | ICD-10-CM | POA: Diagnosis not present

## 2022-03-12 DIAGNOSIS — O209 Hemorrhage in early pregnancy, unspecified: Secondary | ICD-10-CM | POA: Diagnosis not present

## 2022-03-12 DIAGNOSIS — Z3A25 25 weeks gestation of pregnancy: Secondary | ICD-10-CM | POA: Diagnosis not present

## 2022-03-13 DIAGNOSIS — Z363 Encounter for antenatal screening for malformations: Secondary | ICD-10-CM | POA: Diagnosis not present

## 2022-03-13 DIAGNOSIS — O209 Hemorrhage in early pregnancy, unspecified: Secondary | ICD-10-CM | POA: Diagnosis not present

## 2022-03-13 DIAGNOSIS — Z3A24 24 weeks gestation of pregnancy: Secondary | ICD-10-CM | POA: Diagnosis not present

## 2022-03-13 DIAGNOSIS — O99212 Obesity complicating pregnancy, second trimester: Secondary | ICD-10-CM | POA: Diagnosis not present

## 2022-03-13 DIAGNOSIS — O0932 Supervision of pregnancy with insufficient antenatal care, second trimester: Secondary | ICD-10-CM | POA: Diagnosis not present

## 2022-03-13 DIAGNOSIS — Z3A25 25 weeks gestation of pregnancy: Secondary | ICD-10-CM | POA: Diagnosis not present

## 2022-03-13 DIAGNOSIS — Z36 Encounter for antenatal screening for chromosomal anomalies: Secondary | ICD-10-CM | POA: Diagnosis not present

## 2022-03-19 ENCOUNTER — Other Ambulatory Visit (HOSPITAL_COMMUNITY)
Admission: RE | Admit: 2022-03-19 | Discharge: 2022-03-19 | Disposition: A | Discharge status: Home / Self Care | Payer: Medicaid Other | Source: Ambulatory Visit | Attending: Obstetrics and Gynecology | Admitting: Obstetrics and Gynecology

## 2022-03-19 ENCOUNTER — Ambulatory Visit (INDEPENDENT_AMBULATORY_CARE_PROVIDER_SITE_OTHER): Payer: Medicaid Other | Admitting: Obstetrics and Gynecology

## 2022-03-19 ENCOUNTER — Encounter: Payer: Self-pay | Admitting: Obstetrics and Gynecology

## 2022-03-19 DIAGNOSIS — B3731 Acute candidiasis of vulva and vagina: Secondary | ICD-10-CM | POA: Diagnosis not present

## 2022-03-19 DIAGNOSIS — Z3401 Encounter for supervision of normal first pregnancy, first trimester: Secondary | ICD-10-CM | POA: Insufficient documentation

## 2022-03-19 DIAGNOSIS — Z3483 Encounter for supervision of other normal pregnancy, third trimester: Secondary | ICD-10-CM | POA: Insufficient documentation

## 2022-03-19 DIAGNOSIS — Z3402 Encounter for supervision of normal first pregnancy, second trimester: Secondary | ICD-10-CM

## 2022-03-19 DIAGNOSIS — Z3A25 25 weeks gestation of pregnancy: Secondary | ICD-10-CM

## 2022-03-19 DIAGNOSIS — Z3480 Encounter for supervision of other normal pregnancy, unspecified trimester: Secondary | ICD-10-CM

## 2022-03-19 HISTORY — DX: Encounter for supervision of other normal pregnancy, unspecified trimester: Z34.80

## 2022-03-19 NOTE — Progress Notes (Signed)
Subjective:  Emily Frank is a 31 y.o. G2P0010 at 7w5dbeing seen today for her first Ob visit. EDD by second trimester U/S. Denies any chronic medications or medical problems. H/O first trimester SAB.  She is currently monitored for the following issues for this low-risk pregnancy and has PCOS (polycystic ovarian syndrome); Morbid obesity ([place]; Depression; B12 deficiency; Insulin resistance; Panic attacks; MDD (major depressive disorder), recurrent episode, severe ([place]; Gastric bypass status for obesity August 2020; and Encounter for supervision of normal first pregnancy in first trimester on their problem list.  Patient reports no complaints.  Contractions: Not present. Vag. Bleeding: Small.  Movement: Present. Denies leaking of fluid.   The following portions of the patient's history were reviewed and updated as appropriate: allergies, current medications, past family history, past medical history, past social history, past surgical history and problem list. Problem list updated.  Objective:   Vitals:   03/19/22 1332  BP: 112/77  Pulse: 93  Weight: 267 lb 11.2 oz (121.4 kg)    Fetal Status: Fetal Heart Rate (bpm): 141   Movement: Present     General:  Alert, oriented and cooperative. Patient is in no acute distress.  Skin: Skin is warm and dry. No rash noted.   Cardiovascular: Normal heart rate noted  Respiratory: Normal respiratory effort, no problems with respiration noted  Abdomen: Soft, gravid, appropriate for gestational age. Pain/Pressure: Absent     Pelvic:  Cervical exam performed        Extremities: Normal range of motion.  Edema: None  Mental Status: Normal mood and affect. Normal behavior. Normal judgment and thought content.   Urinalysis:      Assessment and Plan:  Pregnancy: G2P0010 at 246w5d1. Encounter for supervision of normal first pregnancy in first trimester Prenatal care and labs reviewed with pt Genetic testing discussed Anatomy scan  ordered - Cytology - PAP( [place]) - Cervicovaginal ancillary only( [place]) - Panorama Prenatal Test Full Panel - [place] Custom - HgB A1c - CBC/D/Plt+RPR+Rh+ABO+RubIgG... - Comp Met (CMET) - [place] OB COMP + 14 WK; Future  Preterm labor symptoms and general obstetric precautions including but not limited to vaginal bleeding, contractions, leaking of fluid and fetal movement were reviewed in detail with the patient. Please refer to After Visit Summary for other counseling recommendations.  Return in about 2 weeks (around 04/02/2022) for OB visit, face to face, any provider, fadsting for Glucola.   ErChancy MilroyMD

## 2022-03-19 NOTE — Addendum Note (Signed)
Addended by: Jearld Adjutant on: 03/19/2022 02:20 PM   Modules accepted: Orders

## 2022-03-19 NOTE — Patient Instructions (Addendum)
Second Trimester of Pregnancy  The second trimester of pregnancy is from week 13 through week 27. This is months 4 through 6 of pregnancy. The second trimester is often a time when you feel your best. Your body has adjusted to being pregnant, and you begin to feel better physically. During the second trimester: Morning sickness has lessened or stopped completely. You may have more energy. You may have an increase in appetite. The second trimester is also a time when the unborn baby (fetus) is growing rapidly. At the end of the sixth month, the fetus may be up to 12 inches long and weigh about 1 pounds. You will likely begin to feel the baby move (quickening) between 16 and 20 weeks of pregnancy. Body changes during your second trimester Your body continues to go through many changes during your second trimester. The changes vary and generally return to normal after the baby is born. Physical changes Your weight will continue to increase. You will notice your lower abdomen bulging out. You may begin to get stretch marks on your hips, abdomen, and breasts. Your breasts will continue to grow and to become tender. Dark spots or blotches (chloasma or mask of pregnancy) may develop on your face. A dark line from your belly button to the pubic area (linea nigra) may appear. You may have changes in your hair. These can include thickening of your hair, rapid growth, and changes in texture. Some people also have hair loss during or after pregnancy, or hair that feels dry or thin. Health changes You may develop headaches. You may have heartburn. You may develop constipation. You may develop hemorrhoids or swollen, bulging veins (varicose veins). Your gums may bleed and may be sensitive to brushing and flossing. You may urinate more often because the fetus is pressing on your bladder. You may have back pain. This is caused by: Weight gain. Pregnancy hormones that are relaxing the joints in your  pelvis. A shift in weight and the muscles that support your balance. Follow these instructions at home: Medicines Follow your health care provider's instructions regarding medicine use. Specific medicines may be either safe or unsafe to take during pregnancy. Do not take any medicines unless approved by your health care provider. Take a prenatal vitamin that contains at least 600 micrograms (mcg) of folic acid. Eating and drinking Eat a healthy diet that includes fresh fruits and vegetables, whole grains, good sources of protein such as meat, eggs, or tofu, and low-fat dairy products. Avoid raw meat and unpasteurized juice, milk, and cheese. These carry germs that can harm you and your baby. You may need to take these actions to prevent or treat constipation: Drink enough fluid to keep your urine pale yellow. Eat foods that are high in fiber, such as beans, whole grains, and fresh fruits and vegetables. Limit foods that are high in fat and processed sugars, such as fried or sweet foods. Activity Exercise only as directed by your health care provider. Most people can continue their usual exercise routine during pregnancy. Try to exercise for 30 minutes at least 5 days a week. Stop exercising if you develop contractions in your uterus. Stop exercising if you develop pain or cramping in the lower abdomen or lower back. Avoid exercising if it is very hot or humid or if you are at a high altitude. Avoid heavy lifting. If you choose to, you may have sex unless your health care provider tells you not to. Relieving pain and discomfort Wear a supportive  bra to prevent discomfort from breast tenderness. Take warm sitz baths to soothe any pain or discomfort caused by hemorrhoids. Use hemorrhoid cream if your health care provider approves. Rest with your legs raised (elevated) if you have leg cramps or low back pain. If you develop varicose veins: Wear support hose as told by your health care  provider. Elevate your feet for 15 minutes, 3-4 times a day. Limit salt in your diet. Safety Wear your seat belt at all times when driving or riding in a car. Talk with your health care provider if someone is verbally or physically abusive to you. Lifestyle Do not use hot tubs, steam rooms, or saunas. Do not douche. Do not use tampons or scented sanitary pads. Avoid cat litter boxes and soil used by cats. These carry germs that can cause birth defects in the baby and possibly loss of the fetus by miscarriage or stillbirth. Do not use herbal remedies, alcohol, illegal drugs, or medicines that are not approved by your health care provider. Chemicals in these products can harm your baby. Do not use any products that contain nicotine or tobacco, such as cigarettes, e-cigarettes, and chewing tobacco. If you need help quitting, ask your health care provider. General instructions During a routine prenatal visit, your health care provider will do a physical exam and other tests. He or she will also discuss your overall health. Keep all follow-up visits. This is important. Ask your health care provider for a referral to a local prenatal education class. Ask for help if you have counseling or nutritional needs during pregnancy. Your health care provider can offer advice or refer you to specialists for help with various needs. Where to find more information American Pregnancy Association: americanpregnancy.org [employer]: https://www.todd-brady.net/ Office on [employer] [facility]: MightyReward.co.nz Contact a health care provider if you have: A headache that does not go away when you take medicine. Vision changes or you see spots in front of your eyes. Mild pelvic cramps, pelvic pressure, or nagging pain in the abdominal area. Persistent nausea, vomiting, or diarrhea. A bad-smelling vaginal discharge or foul-smelling urine. Pain when you  urinate. Sudden or extreme swelling of your face, hands, ankles, feet, or legs. A fever. Get help right away if you: Have fluid leaking from your vagina. Have spotting or bleeding from your vagina. Have severe abdominal cramping or pain. Have difficulty breathing. Have chest pain. Have fainting spells. Have not felt your baby move for the time period told by your health care provider. Have new or increased pain, swelling, or redness in an arm or leg. Summary The second trimester of pregnancy is from week 13 through week 27 (months 4 through 6). Do not use herbal remedies, alcohol, illegal drugs, or medicines that are not approved by your health care provider. Chemicals in these products can harm your baby. Exercise only as directed by your health care provider. Most people can continue their usual exercise routine during pregnancy. Keep all follow-up visits. This is important. This information is not intended to replace advice given to you by your health care provider. Make sure you discuss any questions you have with your health care provider. Document Revised: 12/07/2019 Document Reviewed: 10/13/2019 Elsevier Patient Education  2023 [employer].  Considering [place]? Guide for patients at [facility] [employer] [facility]) Why consider waterbirth? Gentle birth for babies  Less pain medicine used in labor  May allow for passive descent/less pushing  May reduce perineal tears  More mobility and instinctive  maternal position changes  Increased maternal relaxation   Is waterbirth safe? What are the risks of infection, drowning or other complications? Infection:  Very low risk (3.7 % for tub vs 4.8% for bed)  7 in 8000 waterbirths with documented infection  Poorly cleaned equipment most common cause  Slightly lower group B strep transmission rate  Drowning  Maternal:  Very low risk  Related to seizures or fainting  Newborn:  Very low risk. No evidence of increased risk of  respiratory problems in multiple large studies  Physiological protection from breathing under water  Avoid underwater birth if there are any fetal complications  Once baby's head is out of the water, keep it out.  Birth complication  Some reports of cord trauma, but risk decreased by bringing baby to surface gradually  No evidence of increased risk of shoulder dystocia. Mothers can usually change positions faster in water than in a bed, possibly aiding the maneuvers to free the shoulder.   There are 2 things you MUST do to have a waterbirth with [facility]: Attend a waterbirth class at [employer] [facility] at [facility]   3rd Wednesday of every month from 7-9 pm (virtual during COVID) [employer] at [email or web address] or [email or web address] or by calling [phone] Bring [place] the certificate from the class to your prenatal appointment or send via MyChart Meet with a midwife at 36 weeks* to see if you can still plan a waterbirth and to sign the consent.   *We also recommend that you schedule as many of your prenatal visits with a midwife as possible.    Helpful information: You may want to bring a bathing suit top to the hospital to wear during labor but this is optional.  All other supplies are provided by the hospital. Please arrive at the hospital with signs of active labor, and do not wait at home until late in labor. It takes 45 min- 1 hour for fetal monitoring, and check in to your room to take place, plus transport and filling of the waterbirth tub.    Things that would prevent you from having a waterbirth: Premature, <37wks  Previous cesarean birth  Presence of thick meconium-stained fluid  Multiple gestation (Twins, triplets, etc.)  Uncontrolled diabetes or gestational diabetes requiring medication  Hypertension diagnosed in pregnancy or preexisting hypertension (gestational hypertension, preeclampsia, or chronic hypertension) Fetal growth restriction (your  baby measures less than 10th percentile on ultrasound) Heavy vaginal bleeding  Non-reassuring fetal heart rate  Active infection (MRSA, etc.). Group B Strep is NOT a contraindication for waterbirth.  If your labor has to be induced and induction method requires continuous monitoring of the baby's heart rate  Other risks/issues identified by your obstetrical provider   Please remember that birth is unpredictable. Under certain unforeseeable circumstances your provider may advise against giving birth in the tub. These decisions will be made on a case-by-case basis and with the safety of you and your baby as our highest priority.    Updated 10/16/21

## 2022-03-19 NOTE — Progress Notes (Signed)
Pt presents for NOB visit. Pt is late to care. Pt is [redacted]w[redacted]d, EDD 06-27-22. Pt states she did not know she was pregnant. Pt has concern of MS and sickle cell. Pt has sickle cell trait and FOB has MS. Pt and FOB want DNA testing. Pt interesting in water birth. [place] completed 03/13/22 at [facility].

## 2022-03-20 LAB — COMPREHENSIVE METABOLIC PANEL
ALT: 11 IU/L (ref 0–32)
AST: 17 IU/L (ref 0–40)
Albumin/Globulin Ratio: 1.4 (ref 1.2–2.2)
Albumin: 4 g/dL (ref 3.9–4.9)
Alkaline Phosphatase: 111 IU/L (ref 44–121)
BUN/Creatinine Ratio: 10 (ref 9–23)
BUN: 6 mg/dL (ref 6–20)
Bilirubin Total: 0.2 mg/dL (ref 0.0–1.2)
CO2: 19 mmol/L — ABNORMAL LOW (ref 20–29)
Calcium: 9 mg/dL (ref 8.7–10.2)
Chloride: 101 mmol/L (ref 96–106)
Creatinine, Ser: 0.63 mg/dL (ref 0.57–1.00)
Globulin, Total: 2.8 g/dL (ref 1.5–4.5)
Glucose: 75 mg/dL (ref 70–99)
Potassium: 4.1 mmol/L (ref 3.5–5.2)
Sodium: 136 mmol/L (ref 134–144)
Total Protein: 6.8 g/dL (ref 6.0–8.5)
eGFR: 121 mL/min/{1.73_m2} (ref >59)

## 2022-03-20 LAB — CBC/D/PLT+RPR+RH+ABO+RUBIGG...
Antibody Screen: NEGATIVE
Basophils Absolute: 0 10*3/uL (ref 0.0–0.2)
Basos: 0 %
EOS (ABSOLUTE): 0.1 10*3/uL (ref 0.0–0.4)
Eos: 1 %
HCV Ab: NONREACTIVE
HIV Screen 4th Generation wRfx: NONREACTIVE
Hematocrit: 33.2 % — ABNORMAL LOW (ref 34.0–46.6)
Hemoglobin: 10.2 g/dL — ABNORMAL LOW (ref 11.1–15.9)
Hepatitis B Surface Ag: NEGATIVE
Immature Grans (Abs): 0 10*3/uL (ref 0.0–0.1)
Immature Granulocytes: 0 %
Lymphocytes Absolute: 1.8 10*3/uL (ref 0.7–3.1)
Lymphs: 20 %
MCH: 24.8 pg — ABNORMAL LOW (ref 26.6–33.0)
MCHC: 30.7 g/dL — ABNORMAL LOW (ref 31.5–35.7)
MCV: 81 fL (ref 79–97)
Monocytes Absolute: 0.7 10*3/uL (ref 0.1–0.9)
Monocytes: 8 %
Neutrophils Absolute: 6.4 10*3/uL (ref 1.4–7.0)
Neutrophils: 71 %
Platelets: 280 10*3/uL (ref 150–450)
RBC: 4.12 x10E6/uL (ref 3.77–5.28)
RDW: 15.9 % — ABNORMAL HIGH (ref 11.7–15.4)
RPR Ser Ql: NONREACTIVE
Rh Factor: POSITIVE
Rubella Antibodies, IGG: 3.38 index (ref >0.99)
WBC: 9 10*3/uL (ref 3.4–10.8)

## 2022-03-20 LAB — HEMOGLOBIN A1C
Est. average glucose Bld gHb Est-mCnc: 108 mg/dL
Hgb A1c MFr Bld: 5.4 % (ref 4.8–5.6)

## 2022-03-20 LAB — HCV INTERPRETATION

## 2022-03-21 LAB — CERVICOVAGINAL ANCILLARY ONLY
Bacterial Vaginitis (gardnerella): NEGATIVE
Candida Glabrata: NEGATIVE
Candida Vaginitis: POSITIVE — AB
Chlamydia: NEGATIVE
Comment: NEGATIVE
Comment: NEGATIVE
Comment: NEGATIVE
Comment: NEGATIVE
Comment: NEGATIVE
Comment: NORMAL
Neisseria Gonorrhea: NEGATIVE
Trichomonas: NEGATIVE

## 2022-03-24 ENCOUNTER — Other Ambulatory Visit: Payer: Self-pay | Admitting: Emergency Medicine

## 2022-03-24 DIAGNOSIS — B3731 Acute candidiasis of vulva and vagina: Secondary | ICD-10-CM

## 2022-03-24 MED ORDER — TERCONAZOLE 0.8 % VA CREA: 1.0000 | TOPICAL_CREAM | Freq: Every day | VAGINAL | 0 refills | Status: DC

## 2022-03-24 NOTE — Progress Notes (Signed)
TC to patient about results, Rx sent to pharmacy.

## 2022-03-25 DIAGNOSIS — F411 Generalized anxiety disorder: Secondary | ICD-10-CM | POA: Diagnosis not present

## 2022-03-25 DIAGNOSIS — F331 Major depressive disorder, recurrent, moderate: Secondary | ICD-10-CM | POA: Diagnosis not present

## 2022-03-26 LAB — CYTOLOGY - PAP
Comment: NEGATIVE
Diagnosis: UNDETERMINED — AB
High risk HPV: NEGATIVE

## 2022-03-27 ENCOUNTER — Encounter: Payer: Self-pay | Admitting: Obstetrics and Gynecology

## 2022-03-28 ENCOUNTER — Ambulatory Visit: Payer: Medicaid Other | Attending: Obstetrics and Gynecology

## 2022-03-28 ENCOUNTER — Other Ambulatory Visit: Payer: Medicaid Other

## 2022-03-28 ENCOUNTER — Other Ambulatory Visit: Payer: Self-pay | Admitting: *Deleted

## 2022-03-28 ENCOUNTER — Ambulatory Visit: Payer: Medicaid Other | Admitting: *Deleted

## 2022-03-28 ENCOUNTER — Encounter: Payer: Self-pay | Admitting: *Deleted

## 2022-03-28 ENCOUNTER — Encounter: Payer: Self-pay | Admitting: Emergency Medicine

## 2022-03-28 VITALS — BP 126/71 | HR 99

## 2022-03-28 DIAGNOSIS — O99212 Obesity complicating pregnancy, second trimester: Secondary | ICD-10-CM

## 2022-03-28 DIAGNOSIS — O0932 Supervision of pregnancy with insufficient antenatal care, second trimester: Secondary | ICD-10-CM

## 2022-03-28 DIAGNOSIS — O26842 Uterine size-date discrepancy, second trimester: Secondary | ICD-10-CM

## 2022-03-28 DIAGNOSIS — Z3401 Encounter for supervision of normal first pregnancy, first trimester: Secondary | ICD-10-CM | POA: Diagnosis not present

## 2022-03-28 DIAGNOSIS — Z3482 Encounter for supervision of other normal pregnancy, second trimester: Secondary | ICD-10-CM

## 2022-03-28 DIAGNOSIS — Z363 Encounter for antenatal screening for malformations: Secondary | ICD-10-CM | POA: Insufficient documentation

## 2022-03-28 DIAGNOSIS — O365990 Maternal care for other known or suspected poor fetal growth, unspecified trimester, not applicable or unspecified: Secondary | ICD-10-CM

## 2022-03-28 DIAGNOSIS — Z31430 Encounter of female for testing for genetic disease carrier status for procreative management: Secondary | ICD-10-CM

## 2022-03-28 DIAGNOSIS — R638 Other symptoms and signs concerning food and fluid intake: Secondary | ICD-10-CM

## 2022-03-28 DIAGNOSIS — Z3A27 27 weeks gestation of pregnancy: Secondary | ICD-10-CM | POA: Diagnosis not present

## 2022-04-02 DIAGNOSIS — F411 Generalized anxiety disorder: Secondary | ICD-10-CM | POA: Diagnosis not present

## 2022-04-02 DIAGNOSIS — F331 Major depressive disorder, recurrent, moderate: Secondary | ICD-10-CM | POA: Diagnosis not present

## 2022-04-03 LAB — PANORAMA PRENATAL TEST FULL PANEL:PANORAMA TEST PLUS 5 ADDITIONAL MICRODELETIONS: FETAL FRACTION: 15.8

## 2022-04-08 ENCOUNTER — Other Ambulatory Visit: Payer: Medicaid Other

## 2022-04-08 ENCOUNTER — Encounter: Payer: Self-pay | Admitting: Advanced Practice Midwife

## 2022-04-08 ENCOUNTER — Other Ambulatory Visit: Payer: Self-pay | Admitting: Advanced Practice Midwife

## 2022-04-08 ENCOUNTER — Ambulatory Visit (INDEPENDENT_AMBULATORY_CARE_PROVIDER_SITE_OTHER): Payer: Medicaid Other | Admitting: Advanced Practice Midwife

## 2022-04-08 VITALS — BP 130/85 | HR 101 | Wt 267.8 lb

## 2022-04-08 DIAGNOSIS — O99013 Anemia complicating pregnancy, third trimester: Secondary | ICD-10-CM

## 2022-04-08 DIAGNOSIS — Z3401 Encounter for supervision of normal first pregnancy, first trimester: Secondary | ICD-10-CM

## 2022-04-08 DIAGNOSIS — Z3403 Encounter for supervision of normal first pregnancy, third trimester: Secondary | ICD-10-CM

## 2022-04-08 DIAGNOSIS — Z3A28 28 weeks gestation of pregnancy: Secondary | ICD-10-CM | POA: Diagnosis not present

## 2022-04-08 DIAGNOSIS — Z9884 Bariatric surgery status: Secondary | ICD-10-CM

## 2022-04-08 DIAGNOSIS — O219 Vomiting of pregnancy, unspecified: Secondary | ICD-10-CM

## 2022-04-08 DIAGNOSIS — Z23 Encounter for immunization: Secondary | ICD-10-CM

## 2022-04-08 DIAGNOSIS — O26843 Uterine size-date discrepancy, third trimester: Secondary | ICD-10-CM

## 2022-04-08 DIAGNOSIS — O99213 Obesity complicating pregnancy, third trimester: Secondary | ICD-10-CM

## 2022-04-08 DIAGNOSIS — Z3493 Encounter for supervision of normal pregnancy, unspecified, third trimester: Secondary | ICD-10-CM | POA: Diagnosis not present

## 2022-04-08 DIAGNOSIS — F32A Depression, unspecified: Secondary | ICD-10-CM

## 2022-04-08 MED ORDER — ONDANSETRON 4 MG PO TBDP: 4.0000 mg | ORAL_TABLET | Freq: Three times a day (TID) | ORAL | 2 refills | Status: DC | PRN

## 2022-04-08 MED ORDER — METOCLOPRAMIDE HCL 10 MG PO TABS: 10.0000 mg | ORAL_TABLET | Freq: Three times a day (TID) | ORAL | 5 refills | Status: DC

## 2022-04-08 MED ORDER — TETANUS-DIPHTH-ACELL PERTUSSIS 5-2.5-18.5 LF-MCG/0.5 IM SUSY
0.5000 mL | PREFILLED_SYRINGE | Freq: Once | INTRAMUSCULAR | Status: AC
  Administered 2022-04-08: 0.5 mL via INTRAMUSCULAR

## 2022-04-08 MED ORDER — PROMETHAZINE HCL 12.5 MG PO TABS: 12.5000 mg | ORAL_TABLET | Freq: Every evening | ORAL | 0 refills | Status: DC | PRN

## 2022-04-08 NOTE — Patient Instructions (Signed)

## 2022-04-08 NOTE — Progress Notes (Signed)
Patient presents for ROB visit.  

## 2022-04-08 NOTE — Progress Notes (Signed)
PRENATAL VISIT NOTE  Subjective:  Emily Frank is a 31 y.o. G2P0010 at [redacted]w[redacted]d being seen today for ongoing prenatal care.  She is currently monitored for the following issues for this low-risk pregnancy and has PCOS (polycystic ovarian syndrome); Morbid obesity (HCC); Depression; B12 deficiency; Insulin resistance; Panic attacks; MDD (major depressive disorder), recurrent episode, severe (HCC); History of Roux-en-Y gastric bypass; and Encounter for supervision of normal first pregnancy in first trimester on their problem list.  Patient reports no complaints.  Contractions: Not present. Vag. Bleeding: None.  Movement: Present. Denies leaking of fluid.   The following portions of the patient's history were reviewed and updated as appropriate: allergies, current medications, past family history, past medical history, past social history, past surgical history and problem list.   Objective:   Vitals:   04/08/22 0816  BP: 130/85  Pulse: (!) 101  Weight: 267 lb 12.8 oz (121.5 kg)    Fetal Status: Fetal Heart Rate (bpm): 143 Fundal Height: 32 cm Movement: Present     General:  Alert, oriented and cooperative. Patient is in no acute distress.  Skin: Skin is warm and dry. No rash noted.   Cardiovascular: Normal heart rate noted  Respiratory: Normal respiratory effort, no problems with respiration noted  Abdomen: Soft, gravid, appropriate for gestational age.  Pain/Pressure: Absent     Pelvic: Cervical exam deferred        Extremities: Normal range of motion.  Edema: None  Mental Status: Normal mood and affect. Normal behavior. Normal judgment and thought content.   Assessment and Plan:  Pregnancy: G2P0010 at [redacted]w[redacted]d 1. Encounter for supervision of normal first pregnancy in first trimester --Anticipatory guidance about next visits/weeks of pregnancy given.  - Pt is interested in waterbirth.  No contraindications at this time per chart review/patient assessment.   - Pt to enroll  in class, see CNMs for most visits in the office.  - Discussed waterbirth as option for low-risk pregnancy.  Reviewed conditions that may arise during pregnancy that will risk pt out of waterbirth including hypertension, diabetes, fetal growth restriction <10%ile, etc.   2. [redacted] weeks gestation of pregnancy  - CBC - HIV Antibody (routine testing w rflx) - RPR - Tdap (BOOSTRIX) injection 0.5 mL  3. Depression, unspecified depression type --stable doing well  4. Obesity affecting pregnancy in third trimester   5. Need for immunization against influenza  - Flu Vaccine QUAD 36+ mos IM (Fluarix, Quad PF)  6. Nausea/vomiting in pregnancy --Pt unable to tolerate glucose test today and vomited soon after drinking the glucose load.  --discussed options with pt, especially given history of gastric bypass and pt prefers to try test again with Zofran taken before test --Rx for Reglan and Phenergan for daily use PRN and Zofran to use sparingly, but Zofran dose of 8 mg for the morning of the GTT.  - metoCLOPramide (REGLAN) 10 MG tablet; Take 1 tablet (10 mg total) by mouth 3 (three) times daily with meals.  Dispense: 30 tablet; Refill: 5 - promethazine (PHENERGAN) 12.5 MG tablet; Take 1-2 tablets (12.5-25 mg total) by mouth at bedtime as needed for nausea or vomiting.  Dispense: 30 tablet; Refill: 0 - ondansetron (ZOFRAN-ODT) 4 MG disintegrating tablet; Take 1-2 tablets (4-8 mg total) by mouth every 8 (eight) hours as needed for nausea.  Dispense: 20 tablet; Refill: 2  7. Uterine size date discrepancy pregnancy, third trimester --FH ahead at 32cm today at 28 weeks, but EFW on [place] 03/28/22 is SGA at  10% with follow up for growth ordered on 04/18/22 --Discussed FGR as contraindication for waterbirth but growth [place] may show increase in EFW percentile as pregnancy progresses.   8. History of Roux-en-Y gastric bypass    Preterm labor symptoms and general obstetric precautions including but not limited to  vaginal bleeding, contractions, leaking of fluid and fetal movement were reviewed in detail with the patient. Please refer to After Visit Summary for other counseling recommendations.   Return in about 2 weeks (around 04/22/2022) for Midwife preferred, LOB, GTT at next visit.  Future Appointments  Date Time Provider [place]  04/18/2022  7:15 AM [facility] NURSE [facility]  04/18/2022  7:30 AM [facility] US2 [facility]  04/24/2022  8:15 AM [facility] LAB [facility] None  04/24/2022  8:55 AM Laury Deep, CNM [facility] None    Fatima Blank, CNM

## 2022-04-09 LAB — RPR: RPR Ser Ql: NONREACTIVE

## 2022-04-09 LAB — CBC
Hematocrit: 30.6 % — ABNORMAL LOW (ref 34.0–46.6)
Hemoglobin: 9.4 g/dL — ABNORMAL LOW (ref 11.1–15.9)
MCH: 24 pg — ABNORMAL LOW (ref 26.6–33.0)
MCHC: 30.7 g/dL — ABNORMAL LOW (ref 31.5–35.7)
MCV: 78 fL — ABNORMAL LOW (ref 79–97)
Platelets: 266 10*3/uL (ref 150–450)
RBC: 3.91 x10E6/uL (ref 3.77–5.28)
RDW: 15.4 % (ref 11.7–15.4)
WBC: 8.7 10*3/uL (ref 3.4–10.8)

## 2022-04-09 LAB — HIV ANTIBODY (ROUTINE TESTING W REFLEX): HIV Screen 4th Generation wRfx: NONREACTIVE

## 2022-04-10 LAB — HORIZON CUSTOM: REPORT SUMMARY: POSITIVE — AB

## 2022-04-12 MED ORDER — FERROUS SULFATE 325 (65 FE) MG PO TABS: 325.0000 mg | ORAL_TABLET | ORAL | 5 refills | Status: DC

## 2022-04-12 NOTE — Addendum Note (Signed)
Addended by: Fatima Blank A on: 04/12/2022 03:09 AM   Modules accepted: Orders

## 2022-04-14 ENCOUNTER — Other Ambulatory Visit: Payer: Self-pay | Admitting: *Deleted

## 2022-04-14 ENCOUNTER — Telehealth: Payer: Self-pay | Admitting: *Deleted

## 2022-04-14 DIAGNOSIS — Z3401 Encounter for supervision of normal first pregnancy, first trimester: Secondary | ICD-10-CM

## 2022-04-14 NOTE — Telephone Encounter (Signed)
TC from pt concerning abnormal Horizon results. Pt is tearful. Reassured pt that fetal testing was normal. Advised of carrier only status for alpha thal. Offered referral to genetic counselor, accepted. Order placed.

## 2022-04-16 ENCOUNTER — Telehealth: Payer: Self-pay | Admitting: *Deleted

## 2022-04-16 ENCOUNTER — Inpatient Hospital Stay (HOSPITAL_COMMUNITY)
Admission: AD | Admit: 2022-04-16 | Discharge: 2022-04-16 | Disposition: A | Discharge status: Home / Self Care | Payer: Medicaid Other | Attending: Obstetrics and Gynecology | Admitting: Obstetrics and Gynecology

## 2022-04-16 ENCOUNTER — Encounter (HOSPITAL_COMMUNITY): Payer: Self-pay | Admitting: Obstetrics and Gynecology

## 2022-04-16 DIAGNOSIS — N760 Acute vaginitis: Secondary | ICD-10-CM | POA: Diagnosis not present

## 2022-04-16 DIAGNOSIS — Z3A29 29 weeks gestation of pregnancy: Secondary | ICD-10-CM

## 2022-04-16 DIAGNOSIS — Z0371 Encounter for suspected problem with amniotic cavity and membrane ruled out: Secondary | ICD-10-CM | POA: Diagnosis not present

## 2022-04-16 DIAGNOSIS — O23593 Infection of other part of genital tract in pregnancy, third trimester: Secondary | ICD-10-CM | POA: Insufficient documentation

## 2022-04-16 DIAGNOSIS — B9689 Other specified bacterial agents as the cause of diseases classified elsewhere: Secondary | ICD-10-CM | POA: Diagnosis not present

## 2022-04-16 DIAGNOSIS — O368130 Decreased fetal movements, third trimester, not applicable or unspecified: Secondary | ICD-10-CM | POA: Diagnosis present

## 2022-04-16 DIAGNOSIS — Z3493 Encounter for supervision of normal pregnancy, unspecified, third trimester: Secondary | ICD-10-CM

## 2022-04-16 LAB — URINALYSIS, ROUTINE W REFLEX MICROSCOPIC
Bilirubin Urine: NEGATIVE
Glucose, UA: NEGATIVE mg/dL
Hgb urine dipstick: NEGATIVE
Ketones, ur: 20 mg/dL — AB
Leukocytes,Ua: NEGATIVE
Nitrite: NEGATIVE
Protein, ur: NEGATIVE mg/dL
Specific Gravity, Urine: 1.023 (ref 1.005–1.030)
pH: 5 (ref 5.0–8.0)

## 2022-04-16 LAB — WET PREP, GENITAL
Sperm: NONE SEEN
Trich, Wet Prep: NONE SEEN
WBC, Wet Prep HPF POC: <10 (ref <10)
Yeast Wet Prep HPF POC: NONE SEEN

## 2022-04-16 LAB — POCT FERN TEST

## 2022-04-16 MED ORDER — METRONIDAZOLE 0.75 % VA GEL: 1.0000 | Freq: Two times a day (BID) | VAGINAL | 0 refills | Status: DC

## 2022-04-16 NOTE — MAU Note (Signed)
...  Emily Frank is a 31 y.o. at [redacted]w[redacted]d here in MAU reporting: DFM since 1030 this morning. She is also reporting losing her mucous plug that had blood in it around 1700 this afternoon. She reports she immediately left for the hospital. She reports on her way to the hospital she began feeling fluid leaking as well as left lower abdominal pain that is constant.   Late to Prenatal Care. Started at [redacted]w[redacted]d.  Onset of complaint: 1030 - DFM. 1700 for mucous plug/bleeding. Pain score: 4/10 left lower abdomen  FHT: 132 initial external Lab orders placed from triage:  UA, [name]
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
## 2022-04-16 NOTE — Telephone Encounter (Signed)
TC from pt reporting brown vaginal spotting with wiping. Pt denies UC's or abdominal pain or heavy bleeding and reports good FM. Advised that blood is likely old due to brown color. Advised to seek care in MAU for UC's, abdominal pain, increased vaginal bleeding or decreased FM. Pt verbalized understanding.

## 2022-04-18 ENCOUNTER — Encounter: Payer: Self-pay | Admitting: *Deleted

## 2022-04-18 ENCOUNTER — Other Ambulatory Visit: Payer: Self-pay | Admitting: *Deleted

## 2022-04-18 ENCOUNTER — Ambulatory Visit: Payer: Medicaid Other | Attending: Maternal & Fetal Medicine

## 2022-04-18 ENCOUNTER — Ambulatory Visit: Payer: Medicaid Other | Admitting: *Deleted

## 2022-04-18 DIAGNOSIS — O99212 Obesity complicating pregnancy, second trimester: Secondary | ICD-10-CM | POA: Insufficient documentation

## 2022-04-18 DIAGNOSIS — O99843 Bariatric surgery status complicating pregnancy, third trimester: Secondary | ICD-10-CM | POA: Diagnosis not present

## 2022-04-18 DIAGNOSIS — O99213 Obesity complicating pregnancy, third trimester: Secondary | ICD-10-CM

## 2022-04-18 DIAGNOSIS — O26842 Uterine size-date discrepancy, second trimester: Secondary | ICD-10-CM | POA: Diagnosis present

## 2022-04-18 DIAGNOSIS — E669 Obesity, unspecified: Secondary | ICD-10-CM | POA: Diagnosis not present

## 2022-04-18 DIAGNOSIS — O0932 Supervision of pregnancy with insufficient antenatal care, second trimester: Secondary | ICD-10-CM | POA: Insufficient documentation

## 2022-04-18 DIAGNOSIS — O365930 Maternal care for other known or suspected poor fetal growth, third trimester, not applicable or unspecified: Secondary | ICD-10-CM

## 2022-04-18 DIAGNOSIS — Z3A30 30 weeks gestation of pregnancy: Secondary | ICD-10-CM | POA: Diagnosis not present

## 2022-04-18 DIAGNOSIS — O0933 Supervision of pregnancy with insufficient antenatal care, third trimester: Secondary | ICD-10-CM | POA: Diagnosis not present

## 2022-04-18 DIAGNOSIS — R638 Other symptoms and signs concerning food and fluid intake: Secondary | ICD-10-CM | POA: Insufficient documentation

## 2022-04-18 DIAGNOSIS — O365990 Maternal care for other known or suspected poor fetal growth, unspecified trimester, not applicable or unspecified: Secondary | ICD-10-CM | POA: Insufficient documentation

## 2022-04-18 DIAGNOSIS — O365931 Maternal care for other known or suspected poor fetal growth, third trimester, fetus 1: Secondary | ICD-10-CM

## 2022-04-18 DIAGNOSIS — Z9884 Bariatric surgery status: Secondary | ICD-10-CM

## 2022-04-24 ENCOUNTER — Other Ambulatory Visit: Payer: Self-pay | Admitting: Advanced Practice Midwife

## 2022-04-24 ENCOUNTER — Ambulatory Visit (INDEPENDENT_AMBULATORY_CARE_PROVIDER_SITE_OTHER): Payer: Medicaid Other | Admitting: Obstetrics and Gynecology

## 2022-04-24 ENCOUNTER — Other Ambulatory Visit: Payer: Medicaid Other

## 2022-04-24 ENCOUNTER — Encounter: Payer: Self-pay | Admitting: Obstetrics and Gynecology

## 2022-04-24 VITALS — BP 137/84 | HR 114 | Wt 272.0 lb

## 2022-04-24 DIAGNOSIS — O219 Vomiting of pregnancy, unspecified: Secondary | ICD-10-CM

## 2022-04-24 DIAGNOSIS — Z3403 Encounter for supervision of normal first pregnancy, third trimester: Secondary | ICD-10-CM | POA: Diagnosis not present

## 2022-04-24 DIAGNOSIS — O99019 Anemia complicating pregnancy, unspecified trimester: Secondary | ICD-10-CM

## 2022-04-24 DIAGNOSIS — D509 Iron deficiency anemia, unspecified: Secondary | ICD-10-CM

## 2022-04-24 DIAGNOSIS — O99213 Obesity complicating pregnancy, third trimester: Secondary | ICD-10-CM

## 2022-04-24 DIAGNOSIS — Z3A30 30 weeks gestation of pregnancy: Secondary | ICD-10-CM

## 2022-04-24 DIAGNOSIS — O99843 Bariatric surgery status complicating pregnancy, third trimester: Secondary | ICD-10-CM

## 2022-04-24 NOTE — Progress Notes (Signed)
   LOW-RISK PREGNANCY OFFICE VISIT Patient name: Emily Frank MRN [id number]  Date of birth: 05/05/1991 Chief Complaint:   Routine Prenatal Visit (GTT)  History of Present Illness:   Emily Frank is a 31 y.o. G2P0010 female at [redacted]w[redacted]d with an Estimated Date of Delivery: 06/27/22 being seen today for ongoing management of a low-risk pregnancy.  Today she reports no complaints. She is doing her 2 hr GTT today after eating jelly beans as instructed. She states she called to inform them that she had gastric bypass surgery and was not able to tolerate the glucola drink and if she could perform the testing as recommended by MFM. She was told the only way she could the test without drinking the drink is by eating a recommending amount of jelly beans. Contractions: Not present. Vag. Bleeding: None.  Movement: Present. denies leaking of fluid. Review of Systems:   Pertinent items are noted in HPI Denies abnormal vaginal discharge w/ itching/odor/irritation, headaches, visual changes, shortness of breath, chest pain, abdominal pain, severe nausea/vomiting, or problems with urination or bowel movements unless otherwise stated above. Pertinent History Reviewed:  Reviewed past medical,surgical, social, obstetrical and family history.  Reviewed problem list, medications and allergies. Physical Assessment:   Vitals:   04/24/22 0842  BP: 137/84  Pulse: (!) 114  Weight: 272 lb (123.4 kg)  Body mass index is 46.69 kg/m.        Physical Examination:   General appearance: Well appearing, and in no distress  Mental status: Alert, oriented to person, place, and time  Skin: Warm & dry  Cardiovascular: Normal heart rate noted  Respiratory: Normal respiratory effort, no distress  Abdomen: Soft, gravid, nontender  Pelvic: Cervical exam deferred         Extremities: Edema: None  Fetal Status: Fetal Heart Rate (bpm): 154 Fundal Height: 36 cm Movement: Present    No results found for  this or any previous visit (from the past 24 hour(s)).  Assessment & Plan:  1) Low-risk pregnancy G2P0010 at [redacted]w[redacted]d with an Estimated Date of Delivery: 06/27/22   2) Encounter for supervision of normal first pregnancy in third trimester  3) Previous gastric bypass affecting pregnancy in third trimester, antepartum - Advised she had the option to check BS with finger sticks using a glucometer 4x/day x 2 wks - Apologies given that she was not given that option  4) Obesity affecting pregnancy in third trimester, unspecified obesity type - Periodic growth U/S with MFM - Next one scheduled for next week  5) [redacted] weeks gestation of pregnancy    Meds: No orders of the defined types were placed in this encounter.  Labs/procedures today: 2 hr GTT and 3rd trimester labs  Plan:  Continue routine obstetrical care   Reviewed: Preterm labor symptoms and general obstetric precautions including but not limited to vaginal bleeding, contractions, leaking of fluid and fetal movement were reviewed in detail with the patient.  All questions were answered. Has home bp cuff. Check bp weekly, let us know if >140/90.   Follow-up: Return in about 2 weeks (around 05/08/2022) for Return OB visit.  No orders of the defined types were placed in this encounter.  Laury Deep MSN, CNM 04/24/2022 9:13 AM

## 2022-04-25 LAB — CBC
Hematocrit: 29 % — ABNORMAL LOW (ref 34.0–46.6)
Hemoglobin: 9 g/dL — ABNORMAL LOW (ref 11.1–15.9)
MCH: 24.5 pg — ABNORMAL LOW (ref 26.6–33.0)
MCHC: 31 g/dL — ABNORMAL LOW (ref 31.5–35.7)
MCV: 79 fL (ref 79–97)
Platelets: 275 10*3/uL (ref 150–450)
RBC: 3.67 x10E6/uL — ABNORMAL LOW (ref 3.77–5.28)
RDW: 15.7 % — ABNORMAL HIGH (ref 11.7–15.4)
WBC: 10 10*3/uL (ref 3.4–10.8)

## 2022-04-25 LAB — GLUCOSE TOLERANCE, 2 HOURS W/ 1HR
Glucose, 1 hour: 152 mg/dL (ref 70–179)
Glucose, 2 hour: 74 mg/dL (ref 70–152)
Glucose, Fasting: 78 mg/dL (ref 70–91)

## 2022-04-28 ENCOUNTER — Other Ambulatory Visit (HOSPITAL_COMMUNITY): Payer: Self-pay

## 2022-04-28 ENCOUNTER — Inpatient Hospital Stay (HOSPITAL_COMMUNITY)
Admission: AD | Admit: 2022-04-28 | Discharge: 2022-04-28 | Disposition: A | Discharge status: Home / Self Care | Payer: Medicaid Other | Attending: Obstetrics & Gynecology | Admitting: Obstetrics & Gynecology

## 2022-04-28 ENCOUNTER — Encounter (HOSPITAL_COMMUNITY): Payer: Self-pay | Admitting: Obstetrics & Gynecology

## 2022-04-28 ENCOUNTER — Encounter: Payer: Self-pay | Admitting: Obstetrics and Gynecology

## 2022-04-28 DIAGNOSIS — O99843 Bariatric surgery status complicating pregnancy, third trimester: Secondary | ICD-10-CM | POA: Insufficient documentation

## 2022-04-28 DIAGNOSIS — O99283 Endocrine, nutritional and metabolic diseases complicating pregnancy, third trimester: Secondary | ICD-10-CM | POA: Diagnosis present

## 2022-04-28 DIAGNOSIS — O219 Vomiting of pregnancy, unspecified: Secondary | ICD-10-CM

## 2022-04-28 DIAGNOSIS — Z3A31 31 weeks gestation of pregnancy: Secondary | ICD-10-CM | POA: Diagnosis present

## 2022-04-28 DIAGNOSIS — O212 Late vomiting of pregnancy: Secondary | ICD-10-CM | POA: Diagnosis not present

## 2022-04-28 DIAGNOSIS — O99213 Obesity complicating pregnancy, third trimester: Secondary | ICD-10-CM | POA: Insufficient documentation

## 2022-04-28 DIAGNOSIS — Z0371 Encounter for suspected problem with amniotic cavity and membrane ruled out: Secondary | ICD-10-CM

## 2022-04-28 LAB — URINALYSIS, ROUTINE W REFLEX MICROSCOPIC
Bilirubin Urine: NEGATIVE
Glucose, UA: NEGATIVE mg/dL
Hgb urine dipstick: NEGATIVE
Ketones, ur: 5 mg/dL — AB
Leukocytes,Ua: NEGATIVE
Nitrite: NEGATIVE
Protein, ur: NEGATIVE mg/dL
Specific Gravity, Urine: 1.019 (ref 1.005–1.030)
pH: 5 (ref 5.0–8.0)

## 2022-04-28 LAB — WET PREP, GENITAL
Clue Cells Wet Prep HPF POC: NONE SEEN
Sperm: NONE SEEN
Trich, Wet Prep: NONE SEEN
WBC, Wet Prep HPF POC: <10 (ref <10)
Yeast Wet Prep HPF POC: NONE SEEN

## 2022-04-28 LAB — AMNISURE RUPTURE OF MEMBRANE (ROM) NOT AT ARMC: Amnisure ROM: NEGATIVE

## 2022-04-28 LAB — POCT FERN TEST: POCT Fern Test: NEGATIVE

## 2022-04-28 MED ORDER — ASCORBIC ACID 500 MG PO TABS
500.0000 mg | ORAL_TABLET | ORAL | 1 refills | Status: DC
  Filled 2022-04-28: qty 30, 60d supply, fill #0

## 2022-04-28 MED ORDER — PROMETHAZINE HCL 12.5 MG PO TABS: 12.5000 mg | ORAL_TABLET | Freq: Every evening | ORAL | 0 refills | Status: DC | PRN

## 2022-04-28 MED ORDER — PROCHLORPERAZINE EDISYLATE 10 MG/2ML IJ SOLN
10.0000 mg | Freq: Once | INTRAMUSCULAR | Status: AC
  Administered 2022-04-28: 10 mg via INTRAVENOUS
  Filled 2022-04-28: qty 2

## 2022-04-28 MED ORDER — SCOPOLAMINE 1 MG/3DAYS TD PT72
1.0000 | MEDICATED_PATCH | TRANSDERMAL | 1 refills | Status: DC
  Filled 2022-04-28: qty 10, 30d supply, fill #0

## 2022-04-28 MED ORDER — LACTATED RINGERS IV BOLUS
1000.0000 mL | Freq: Once | INTRAVENOUS | Status: AC
  Administered 2022-04-28: 1000 mL via INTRAVENOUS

## 2022-04-28 MED ORDER — SCOPOLAMINE 1 MG/3DAYS TD PT72
1.0000 | MEDICATED_PATCH | Freq: Once | TRANSDERMAL | Status: DC
  Administered 2022-04-28: 1.5 mg via TRANSDERMAL
  Filled 2022-04-28: qty 1

## 2022-04-28 MED ORDER — FAMOTIDINE IN NACL 20-0.9 MG/50ML-% IV SOLN
20.0000 mg | Freq: Once | INTRAVENOUS | Status: AC
  Administered 2022-04-28: 20 mg via INTRAVENOUS
  Filled 2022-04-28: qty 50

## 2022-04-28 NOTE — Addendum Note (Signed)
Addended by: Graceann Congress on: 04/28/2022 06:49 PM   Modules accepted: Orders

## 2022-04-28 NOTE — MAU Note (Addendum)
...  Emily Frank is a 31 y.o. at [redacted]w[redacted]d here in MAU reporting: Constant painless lower pelvic pressure since 0430 this morning. Gush of fluids that occurred one hour ago and she reports she is still leaking clear fluids. She reports she was vomiting and when she stood up the fluid just continued leaking and has not stopped. She reports she is still nauseous and reports she has battled this her whole pregnancy. Denies VB. +FM.   Onset of complaint: 1300 Pain score: Denies pain.  FHT: 138 initial external Lab orders placed from triage:  UA, [name]
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
## 2022-04-29 ENCOUNTER — Other Ambulatory Visit (HOSPITAL_COMMUNITY): Payer: Self-pay

## 2022-05-01 ENCOUNTER — Encounter: Payer: Self-pay | Admitting: Advanced Practice Midwife

## 2022-05-07 ENCOUNTER — Ambulatory Visit (INDEPENDENT_AMBULATORY_CARE_PROVIDER_SITE_OTHER): Payer: Medicaid Other | Admitting: Obstetrics

## 2022-05-07 ENCOUNTER — Other Ambulatory Visit (HOSPITAL_COMMUNITY): Payer: Self-pay

## 2022-05-07 ENCOUNTER — Encounter: Payer: Self-pay | Admitting: Obstetrics

## 2022-05-07 ENCOUNTER — Encounter: Payer: Medicaid Other | Admitting: Obstetrics

## 2022-05-07 VITALS — BP 120/83 | HR 108 | Wt 266.5 lb

## 2022-05-07 DIAGNOSIS — Z3480 Encounter for supervision of other normal pregnancy, unspecified trimester: Secondary | ICD-10-CM

## 2022-05-07 DIAGNOSIS — Z3A32 32 weeks gestation of pregnancy: Secondary | ICD-10-CM

## 2022-05-07 DIAGNOSIS — D508 Other iron deficiency anemias: Secondary | ICD-10-CM

## 2022-05-07 DIAGNOSIS — O219 Vomiting of pregnancy, unspecified: Secondary | ICD-10-CM

## 2022-05-07 DIAGNOSIS — Z3483 Encounter for supervision of other normal pregnancy, third trimester: Secondary | ICD-10-CM

## 2022-05-07 DIAGNOSIS — O99213 Obesity complicating pregnancy, third trimester: Secondary | ICD-10-CM

## 2022-05-07 NOTE — Progress Notes (Signed)
ROB 32.5 wks Nausea and vomiting cont's Insurance will not cover scope patch

## 2022-05-07 NOTE — Progress Notes (Signed)
PA submitted to Cover My Meds for scopolamine patch.

## 2022-05-07 NOTE — Progress Notes (Signed)
Subjective:  Emily Frank is a 31 y.o. G2P0010 at [redacted]w[redacted]d being seen today for ongoing prenatal care.  She is currently monitored for the following issues for this low-risk pregnancy and has PCOS (polycystic ovarian syndrome); Morbid obesity ([place]); Depression; B12 deficiency; Insulin resistance; Panic attacks; MDD (major depressive disorder), recurrent episode, severe ([place]); History of Roux-en-Y gastric bypass; and Encounter for supervision of normal first pregnancy in first trimester on their problem list.  Patient reports nausea and vomiting.  Contractions: Not present. Vag. Bleeding: None.  Movement: Present. Denies leaking of fluid.   The following portions of the patient's history were reviewed and updated as appropriate: allergies, current medications, past family history, past medical history, past social history, past surgical history and problem list. Problem list updated.  Objective:   Vitals:   05/07/22 1319  BP: 120/83  Pulse: (!) 108  Weight: 266 lb 8 oz (120.9 kg)    Fetal Status: Fetal Heart Rate (bpm): 131   Movement: Present     General:  Alert, oriented and cooperative. Patient is in no acute distress.  Skin: Skin is warm and dry. No rash noted.   Cardiovascular: Normal heart rate noted  Respiratory: Normal respiratory effort, no problems with respiration noted  Abdomen: Soft, gravid, appropriate for gestational age. Pain/Pressure: Present     Pelvic:  Cervical exam deferred        Extremities: Normal range of motion.  Edema: Trace  Mental Status: Normal mood and affect. Normal behavior. Normal judgment and thought content.   Urinalysis:      Assessment and Plan:  Pregnancy: G2P0010 at [redacted]w[redacted]d  1. Supervision of other normal pregnancy, antepartum  2. Nausea/vomiting in pregnancy - Scopolamine patch Rx along with Phenergan prn   3. Obesity affecting pregnancy in third trimester, unspecified obesity type  4. Iron deficiency anemia secondary to  inadequate dietary iron intake - Iron infusion ordered   There are no diagnoses linked to this encounter. Preterm labor symptoms and general obstetric precautions including but not limited to vaginal bleeding, contractions, leaking of fluid and fetal movement were reviewed in detail with the patient. Please refer to After Visit Summary for other counseling recommendations.   Return in about 2 months (around 07/07/2022) for ROB.   Shelly Bombard, MD  05/07/22

## 2022-05-08 ENCOUNTER — Ambulatory Visit: Payer: Medicaid Other

## 2022-05-08 ENCOUNTER — Other Ambulatory Visit: Payer: Self-pay

## 2022-05-08 ENCOUNTER — Other Ambulatory Visit (HOSPITAL_COMMUNITY): Payer: Self-pay

## 2022-05-08 DIAGNOSIS — O219 Vomiting of pregnancy, unspecified: Secondary | ICD-10-CM

## 2022-05-08 MED ORDER — SCOPOLAMINE 1 MG/3DAYS TD PT72
1.0000 | MEDICATED_PATCH | TRANSDERMAL | 1 refills | Status: DC
  Filled 2022-05-08: qty 10, 30d supply, fill #0

## 2022-05-09 ENCOUNTER — Ambulatory Visit: Payer: Medicaid Other | Admitting: *Deleted

## 2022-05-09 ENCOUNTER — Other Ambulatory Visit: Payer: Self-pay | Admitting: *Deleted

## 2022-05-09 ENCOUNTER — Ambulatory Visit: Payer: Medicaid Other | Attending: Obstetrics and Gynecology

## 2022-05-09 ENCOUNTER — Other Ambulatory Visit: Payer: Self-pay | Admitting: Obstetrics and Gynecology

## 2022-05-09 VITALS — BP 127/77 | HR 115

## 2022-05-09 DIAGNOSIS — O365931 Maternal care for other known or suspected poor fetal growth, third trimester, fetus 1: Secondary | ICD-10-CM | POA: Diagnosis present

## 2022-05-09 DIAGNOSIS — O99213 Obesity complicating pregnancy, third trimester: Secondary | ICD-10-CM

## 2022-05-09 DIAGNOSIS — Z3A33 33 weeks gestation of pregnancy: Secondary | ICD-10-CM

## 2022-05-09 DIAGNOSIS — Z9884 Bariatric surgery status: Secondary | ICD-10-CM

## 2022-05-09 DIAGNOSIS — O99843 Bariatric surgery status complicating pregnancy, third trimester: Secondary | ICD-10-CM

## 2022-05-09 DIAGNOSIS — O0933 Supervision of pregnancy with insufficient antenatal care, third trimester: Secondary | ICD-10-CM

## 2022-05-09 DIAGNOSIS — E669 Obesity, unspecified: Secondary | ICD-10-CM

## 2022-05-09 DIAGNOSIS — O365930 Maternal care for other known or suspected poor fetal growth, third trimester, not applicable or unspecified: Secondary | ICD-10-CM

## 2022-05-09 DIAGNOSIS — O365990 Maternal care for other known or suspected poor fetal growth, unspecified trimester, not applicable or unspecified: Secondary | ICD-10-CM

## 2022-05-09 NOTE — Procedures (Signed)
Sevilla Murtagh Duffus [birth year] [redacted]w[redacted]d  Fetus A Non-Stress Test Interpretation for 05/09/22  Indication: Unsatisfactory BPP and obese  Fetal Heart Rate A Mode: External Baseline Rate (A): 135 bpm Variability: Moderate Accelerations: 15 x 15 Decelerations: None Multiple birth?: No  Uterine Activity Mode: Toco Contraction Frequency (min): none Resting Tone Palpated: Relaxed  Interpretation (Fetal Testing) Nonstress Test Interpretation: Reactive Overall Impression: Reassuring for gestational age Comments: tracing reviewed by Dr. Gertie Exon

## 2022-05-12 ENCOUNTER — Ambulatory Visit: Payer: Medicaid Other | Attending: Maternal & Fetal Medicine

## 2022-05-14 ENCOUNTER — Other Ambulatory Visit (HOSPITAL_COMMUNITY): Payer: Self-pay

## 2022-05-14 ENCOUNTER — Other Ambulatory Visit: Payer: Self-pay | Admitting: *Deleted

## 2022-05-14 DIAGNOSIS — O26893 Other specified pregnancy related conditions, third trimester: Secondary | ICD-10-CM

## 2022-05-14 MED ORDER — FAMOTIDINE 20 MG PO TABS
20.0000 mg | ORAL_TABLET | Freq: Two times a day (BID) | ORAL | 2 refills | Status: DC
  Filled 2022-05-14: qty 60, 30d supply, fill #0

## 2022-05-14 NOTE — Progress Notes (Signed)
TC from pt reporting heartburn. Request med. Dr. Jodi Mourning was consulted. Verbal order Pepcid 20 mg BID. RX sent.

## 2022-05-16 ENCOUNTER — Telehealth: Payer: Self-pay | Admitting: *Deleted

## 2022-05-16 ENCOUNTER — Ambulatory Visit: Payer: Medicaid Other | Admitting: *Deleted

## 2022-05-16 ENCOUNTER — Other Ambulatory Visit: Payer: Self-pay | Admitting: Obstetrics and Gynecology

## 2022-05-16 ENCOUNTER — Ambulatory Visit: Payer: Medicaid Other | Attending: Obstetrics and Gynecology

## 2022-05-16 ENCOUNTER — Other Ambulatory Visit: Payer: Self-pay | Admitting: *Deleted

## 2022-05-16 VITALS — BP 122/69 | HR 103

## 2022-05-16 DIAGNOSIS — O99213 Obesity complicating pregnancy, third trimester: Secondary | ICD-10-CM

## 2022-05-16 DIAGNOSIS — O365990 Maternal care for other known or suspected poor fetal growth, unspecified trimester, not applicable or unspecified: Secondary | ICD-10-CM | POA: Insufficient documentation

## 2022-05-16 DIAGNOSIS — O365931 Maternal care for other known or suspected poor fetal growth, third trimester, fetus 1: Secondary | ICD-10-CM | POA: Diagnosis present

## 2022-05-16 DIAGNOSIS — O365930 Maternal care for other known or suspected poor fetal growth, third trimester, not applicable or unspecified: Secondary | ICD-10-CM

## 2022-05-16 DIAGNOSIS — Z9884 Bariatric surgery status: Secondary | ICD-10-CM | POA: Diagnosis present

## 2022-05-16 DIAGNOSIS — O0933 Supervision of pregnancy with insufficient antenatal care, third trimester: Secondary | ICD-10-CM | POA: Diagnosis not present

## 2022-05-16 DIAGNOSIS — Z3A34 34 weeks gestation of pregnancy: Secondary | ICD-10-CM | POA: Diagnosis not present

## 2022-05-16 DIAGNOSIS — E669 Obesity, unspecified: Secondary | ICD-10-CM | POA: Diagnosis not present

## 2022-05-16 DIAGNOSIS — O99843 Bariatric surgery status complicating pregnancy, third trimester: Secondary | ICD-10-CM

## 2022-05-16 NOTE — Procedures (Signed)
Emily Frank 04/25/1991 [redacted]w[redacted]d  Fetus A Non-Stress Test Interpretation for 05/16/22  Indication: IUGR, Unsatisfactory BPP, and morbidly obese  Fetal Heart Rate A Mode: External Baseline Rate (A): 130 bpm Variability: Moderate Accelerations: 15 x 15 Decelerations: None Multiple birth?: No  Uterine Activity Mode: Toco Contraction Frequency (min): none Contraction Quality: Mild Resting Tone Palpated: Relaxed  Interpretation (Fetal Testing) Nonstress Test Interpretation: Reactive Overall Impression: Reassuring for gestational age Comments: tracing reviewed by Dr. Donalee Citrin

## 2022-05-16 NOTE — Telephone Encounter (Signed)
TC from pt regarding appt time change on 05/21/22. Pt also voicing concerns about MFM recommendation for IOL at 37 wks if severe FGR continues. Pt reassured and provided information on methods of IOL. Reassured of careful monitoring of baby and availability of NICU should they be needed. Pt verbalized understanding. Call transferred to schedulers.

## 2022-05-21 ENCOUNTER — Encounter: Payer: Self-pay | Admitting: Student

## 2022-05-21 ENCOUNTER — Ambulatory Visit (INDEPENDENT_AMBULATORY_CARE_PROVIDER_SITE_OTHER): Payer: Medicaid Other | Admitting: Student

## 2022-05-21 ENCOUNTER — Encounter: Payer: Medicaid Other | Admitting: Student

## 2022-05-21 ENCOUNTER — Non-Acute Institutional Stay (HOSPITAL_COMMUNITY)
Admission: RE | Admit: 2022-05-21 | Discharge: 2022-05-21 | Disposition: A | Discharge status: Home / Self Care | Payer: Medicaid Other | Source: Ambulatory Visit | Attending: Internal Medicine | Admitting: Internal Medicine

## 2022-05-21 VITALS — BP 130/88 | HR 115 | Wt 270.0 lb

## 2022-05-21 DIAGNOSIS — O99013 Anemia complicating pregnancy, third trimester: Secondary | ICD-10-CM

## 2022-05-21 DIAGNOSIS — O99019 Anemia complicating pregnancy, unspecified trimester: Secondary | ICD-10-CM | POA: Insufficient documentation

## 2022-05-21 DIAGNOSIS — O99213 Obesity complicating pregnancy, third trimester: Secondary | ICD-10-CM

## 2022-05-21 DIAGNOSIS — Z3483 Encounter for supervision of other normal pregnancy, third trimester: Secondary | ICD-10-CM

## 2022-05-21 DIAGNOSIS — D509 Iron deficiency anemia, unspecified: Secondary | ICD-10-CM

## 2022-05-21 DIAGNOSIS — O365990 Maternal care for other known or suspected poor fetal growth, unspecified trimester, not applicable or unspecified: Secondary | ICD-10-CM

## 2022-05-21 DIAGNOSIS — Z3A34 34 weeks gestation of pregnancy: Secondary | ICD-10-CM

## 2022-05-21 DIAGNOSIS — O365930 Maternal care for other known or suspected poor fetal growth, third trimester, not applicable or unspecified: Secondary | ICD-10-CM

## 2022-05-21 DIAGNOSIS — Z3480 Encounter for supervision of other normal pregnancy, unspecified trimester: Secondary | ICD-10-CM

## 2022-05-21 DIAGNOSIS — O99843 Bariatric surgery status complicating pregnancy, third trimester: Secondary | ICD-10-CM

## 2022-05-21 MED ORDER — SODIUM CHLORIDE 0.9 % IV SOLN: INTRAVENOUS | Status: DC | PRN

## 2022-05-21 MED ORDER — METHYLPREDNISOLONE SODIUM SUCC 125 MG IJ SOLR: 125.0000 mg | Freq: Once | INTRAMUSCULAR | Status: DC | PRN

## 2022-05-21 MED ORDER — DIPHENHYDRAMINE HCL 50 MG/ML IJ SOLN: 25.0000 mg | Freq: Once | INTRAMUSCULAR | Status: DC | PRN

## 2022-05-21 MED ORDER — ALBUTEROL SULFATE (2.5 MG/3ML) 0.083% IN NEBU: 2.5000 mg | INHALATION_SOLUTION | Freq: Once | RESPIRATORY_TRACT | Status: DC | PRN

## 2022-05-21 MED ORDER — EPINEPHRINE PF 1 MG/ML IJ SOLN: 0.3000 mg | Freq: Once | INTRAMUSCULAR | Status: DC | PRN

## 2022-05-21 MED ORDER — SODIUM CHLORIDE 0.9 % IV BOLUS: 500.0000 mL | Freq: Once | INTRAVENOUS | Status: DC | PRN

## 2022-05-21 MED ORDER — SODIUM CHLORIDE 0.9 % IV SOLN
500.0000 mg | INTRAVENOUS | Status: DC
  Administered 2022-05-21: 500 mg via INTRAVENOUS
  Filled 2022-05-21: qty 25

## 2022-05-21 NOTE — Progress Notes (Addendum)
PATIENT CARE CENTER NOTE:  Provider:  Coral Ceo MD  Diagnosis: anemia in pregnancy  Procedure: Venofer 500mg  infusion    Patient received IV Venofer ( dose 1 of 2). Premedications not required per orders. Pt stated she had a 'bad taste' in her mouth approx. 1 hour into infusion, medication paused and VS obtained. Pt states she has no other complaints, VSS, pt given a snack and states it is helping, otherwise states she feels fine, not further intervention needed and infusion resumed. Tolerated remainder of well, vitals stable, discharge instructions given , verbalized understanding. Pt instructed to schedule next infusion in 1 week prior to leaving, pt verbalized understanding. Patient alert, oriented, and ambulatory at the time of discharge.

## 2022-05-21 NOTE — Progress Notes (Signed)
Pt presents for ROB visit. No concerns at this time.  

## 2022-05-21 NOTE — Progress Notes (Signed)
PRENATAL VISIT NOTE  Subjective:  Emily Frank is a 31 y.o. G2P0010 at [redacted]w[redacted]d being seen today for ongoing prenatal care.  She is currently monitored for the following issues for this low-risk pregnancy and has PCOS (polycystic ovarian syndrome); Morbid obesity (HCC); Depression; B12 deficiency; Insulin resistance; Panic attacks; MDD (major depressive disorder), recurrent episode, severe (HCC); History of Roux-en-Y gastric bypass; and Encounter for supervision of normal first pregnancy in first trimester on their problem list.  Patient reports no complaints.  Contractions: Irritability. Vag. Bleeding: None.  Movement: Present. Denies leaking of fluid.   The following portions of the patient's history were reviewed and updated as appropriate: allergies, current medications, past family history, past medical history, past social history, past surgical history and problem list.   Objective:   Vitals:   05/21/22 1451  BP: 130/88  Pulse: (!) 115  Weight: 270 lb (122.5 kg)    Fetal Status: Fetal Heart Rate (bpm): 135   Movement: Present     General:  Alert, oriented and cooperative. Patient is in no acute distress.  Skin: Skin is warm and dry. No rash noted.   Cardiovascular: Normal heart rate noted  Respiratory: Normal respiratory effort, no problems with respiration noted  Abdomen: Soft, gravid, appropriate for gestational age.  Pain/Pressure: Present     Pelvic: Cervical exam deferred        Extremities: Normal range of motion.  Edema: Trace  Mental Status: Normal mood and affect. Normal behavior. Normal judgment and thought content.   Assessment and Plan:  Pregnancy: G2P0010 at [redacted]w[redacted]d 1. Supervision of other normal pregnancy, antepartum - Frequent and vigorous fetal movement  2. [redacted] weeks gestation of pregnancy - Follow-up appointment next week  3. Obesity affecting pregnancy in third trimester, unspecified obesity type - Follow-up growth scan is scheduled  4. Iron  deficiency anemia during pregnancy - 1/2 IV Iron infusions completed today  5. Fetal growth restriction antepartum - Follow-up growth scan on 05/28/22 - EFW 3rd%tile on most recent scan  - Discussed the possibility of being induced at 37 weeks.  6. Previous gastric bypass affecting pregnancy in third trimester, antepartum   Preterm labor symptoms and general obstetric precautions including but not limited to vaginal bleeding, contractions, leaking of fluid and fetal movement were reviewed in detail with the patient. Please refer to After Visit Summary for other counseling recommendations.   Return in about 9 days (around 05/30/2022) for LOB/GBS, IN-PERSON.  Future Appointments  Date Time Provider Department Center  05/23/2022  8:15 AM [facility] NURSE [facility]  05/23/2022  8:30 AM [facility] US3 [facility]  05/23/2022  9:45 AM [facility] NST [facility]  05/28/2022  7:15 AM [facility] NURSE [facility]  05/28/2022  7:30 AM [facility] US2 [facility]  05/28/2022  8:45 AM [facility] NST [facility]  05/28/2022  9:30 AM [facility] RM 1 [facility] None  05/29/2022  2:50 PM Warden Fillers, MD [facility] None  06/03/2022  7:15 AM [facility] NURSE [facility]  06/03/2022  7:30 AM [facility] US3 [facility]  06/03/2022  8:45 AM [facility] NST [facility]  06/04/2022 10:55 AM Brock Bad, MD [facility] None  06/11/2022  1:50 PM Raelyn Mora, CNM [facility] None  06/13/2022  7:30 AM [facility] NURSE [facility]  06/13/2022  7:45 AM [facility] US6 [facility]  06/13/2022  8:45 AM [facility] NST [facility]  06/18/2022  1:50 PM Corlis Hove, NP [facility] None  06/25/2022  1:30 PM Corlis Hove, NP [facility] None  07/02/2022  1:30 PM Corlis Hove, NP [facility] None    Corlis Hove, NP

## 2022-05-23 ENCOUNTER — Ambulatory Visit: Payer: Medicaid Other | Attending: Obstetrics and Gynecology

## 2022-05-23 ENCOUNTER — Other Ambulatory Visit: Payer: Self-pay | Admitting: Obstetrics and Gynecology

## 2022-05-23 ENCOUNTER — Ambulatory Visit: Payer: Medicaid Other | Admitting: *Deleted

## 2022-05-23 DIAGNOSIS — E669 Obesity, unspecified: Secondary | ICD-10-CM

## 2022-05-23 DIAGNOSIS — O99213 Obesity complicating pregnancy, third trimester: Secondary | ICD-10-CM | POA: Insufficient documentation

## 2022-05-23 DIAGNOSIS — O365990 Maternal care for other known or suspected poor fetal growth, unspecified trimester, not applicable or unspecified: Secondary | ICD-10-CM | POA: Insufficient documentation

## 2022-05-23 DIAGNOSIS — O0933 Supervision of pregnancy with insufficient antenatal care, third trimester: Secondary | ICD-10-CM | POA: Insufficient documentation

## 2022-05-23 DIAGNOSIS — Z9884 Bariatric surgery status: Secondary | ICD-10-CM

## 2022-05-23 DIAGNOSIS — Z3A35 35 weeks gestation of pregnancy: Secondary | ICD-10-CM | POA: Diagnosis not present

## 2022-05-23 DIAGNOSIS — Z362 Encounter for other antenatal screening follow-up: Secondary | ICD-10-CM | POA: Diagnosis not present

## 2022-05-23 DIAGNOSIS — O365930 Maternal care for other known or suspected poor fetal growth, third trimester, not applicable or unspecified: Secondary | ICD-10-CM | POA: Diagnosis not present

## 2022-05-23 DIAGNOSIS — O365931 Maternal care for other known or suspected poor fetal growth, third trimester, fetus 1: Secondary | ICD-10-CM | POA: Insufficient documentation

## 2022-05-23 DIAGNOSIS — O99843 Bariatric surgery status complicating pregnancy, third trimester: Secondary | ICD-10-CM | POA: Diagnosis not present

## 2022-05-23 NOTE — Procedures (Signed)
Emily Frank 1991/04/26 [redacted]w[redacted]d  Fetus A Non-Stress Test Interpretation for 05/23/22  Indication: IUGR and obese  Fetal Heart Rate A Mode: External Baseline Rate (A): 130 bpm Variability: Moderate, Minimal Accelerations: 15 x 15 Decelerations: None Multiple birth?: No  Uterine Activity Mode: Toco Resting Tone Palpated: Relaxed  Interpretation (Fetal Testing) Nonstress Test Interpretation: Reactive Overall Impression: Reassuring for gestational age Comments: tracing reviewed by Dr. Judeth Cornfield

## 2022-05-28 ENCOUNTER — Encounter (HOSPITAL_COMMUNITY): Payer: Medicaid Other

## 2022-05-28 ENCOUNTER — Other Ambulatory Visit (HOSPITAL_COMMUNITY): Payer: Self-pay

## 2022-05-28 ENCOUNTER — Ambulatory Visit: Payer: Medicaid Other | Admitting: *Deleted

## 2022-05-28 ENCOUNTER — Ambulatory Visit: Payer: Medicaid Other | Attending: Obstetrics and Gynecology

## 2022-05-28 VITALS — BP 121/63 | HR 97

## 2022-05-28 DIAGNOSIS — Z3A35 35 weeks gestation of pregnancy: Secondary | ICD-10-CM

## 2022-05-28 DIAGNOSIS — O365990 Maternal care for other known or suspected poor fetal growth, unspecified trimester, not applicable or unspecified: Secondary | ICD-10-CM | POA: Diagnosis present

## 2022-05-28 DIAGNOSIS — O365930 Maternal care for other known or suspected poor fetal growth, third trimester, not applicable or unspecified: Secondary | ICD-10-CM

## 2022-05-28 DIAGNOSIS — O0933 Supervision of pregnancy with insufficient antenatal care, third trimester: Secondary | ICD-10-CM | POA: Diagnosis not present

## 2022-05-28 DIAGNOSIS — O99843 Bariatric surgery status complicating pregnancy, third trimester: Secondary | ICD-10-CM

## 2022-05-28 DIAGNOSIS — Z362 Encounter for other antenatal screening follow-up: Secondary | ICD-10-CM | POA: Diagnosis not present

## 2022-05-28 DIAGNOSIS — O99213 Obesity complicating pregnancy, third trimester: Secondary | ICD-10-CM | POA: Diagnosis not present

## 2022-05-28 DIAGNOSIS — E669 Obesity, unspecified: Secondary | ICD-10-CM

## 2022-05-28 NOTE — Procedures (Signed)
Dorene Bruni Llerenas 10/31/1990 [redacted]w[redacted]d  Fetus A Non-Stress Test Interpretation for 05/28/22  Indication: IUGR and obese  Fetal Heart Rate A Mode: External Baseline Rate (A): 125 bpm Variability: Moderate Accelerations: 15 x 15 Decelerations: None Multiple birth?: No  Uterine Activity Mode: Toco Contraction Frequency (min): none Resting Tone Palpated: Relaxed  Interpretation (Fetal Testing) Nonstress Test Interpretation: Reactive Overall Impression: Reassuring for gestational age Comments: tracing reviewed by Dr. Darra Lis

## 2022-05-29 ENCOUNTER — Telehealth (HOSPITAL_COMMUNITY): Payer: Self-pay | Admitting: *Deleted

## 2022-05-29 ENCOUNTER — Other Ambulatory Visit (HOSPITAL_COMMUNITY)
Admission: RE | Admit: 2022-05-29 | Discharge: 2022-05-29 | Disposition: A | Discharge status: Home / Self Care | Payer: Medicaid Other | Source: Ambulatory Visit | Attending: Obstetrics and Gynecology | Admitting: Obstetrics and Gynecology

## 2022-05-29 ENCOUNTER — Ambulatory Visit (INDEPENDENT_AMBULATORY_CARE_PROVIDER_SITE_OTHER): Payer: Medicaid Other | Admitting: Obstetrics and Gynecology

## 2022-05-29 ENCOUNTER — Encounter (HOSPITAL_COMMUNITY): Payer: Self-pay | Admitting: *Deleted

## 2022-05-29 VITALS — BP 102/71 | HR 88 | Wt 272.0 lb

## 2022-05-29 DIAGNOSIS — O365990 Maternal care for other known or suspected poor fetal growth, unspecified trimester, not applicable or unspecified: Secondary | ICD-10-CM | POA: Insufficient documentation

## 2022-05-29 DIAGNOSIS — Z3483 Encounter for supervision of other normal pregnancy, third trimester: Secondary | ICD-10-CM | POA: Insufficient documentation

## 2022-05-29 DIAGNOSIS — Z6841 Body Mass Index (BMI) 40.0 and over, adult: Secondary | ICD-10-CM

## 2022-05-29 DIAGNOSIS — O99213 Obesity complicating pregnancy, third trimester: Secondary | ICD-10-CM

## 2022-05-29 DIAGNOSIS — Z3490 Encounter for supervision of normal pregnancy, unspecified, unspecified trimester: Secondary | ICD-10-CM

## 2022-05-29 DIAGNOSIS — Z9884 Bariatric surgery status: Secondary | ICD-10-CM

## 2022-05-29 DIAGNOSIS — Z3A35 35 weeks gestation of pregnancy: Secondary | ICD-10-CM

## 2022-05-29 NOTE — Telephone Encounter (Signed)
Preadmission screen  

## 2022-05-29 NOTE — Progress Notes (Signed)
   PRENATAL VISIT NOTE  Subjective:  Emily Frank is a 31 y.o. G2P0010 at [redacted]w[redacted]d being seen today for ongoing prenatal care.  She is currently monitored for the following issues for this high-risk pregnancy and has PCOS (polycystic ovarian syndrome); Morbid obesity (HCC); Depression; B12 deficiency; Insulin resistance; Panic attacks; MDD (major depressive disorder), recurrent episode, severe (HCC); History of Roux-en-Y gastric bypass; Encounter for supervision of other normal pregnancy, third trimester; Fetal growth restriction antepartum; BMI 45.0-49.9, adult (HCC); and Obesity affecting pregnancy in third trimester on their problem list.  Patient doing well with no acute concerns today. She reports no complaints.  Contractions: Irregular. Vag. Bleeding: None.  Movement: Present. Denies leaking of fluid.   The following portions of the patient's history were reviewed and updated as appropriate: allergies, current medications, past family history, past medical history, past social history, past surgical history and problem list. Problem list updated.  Objective:   Vitals:   05/29/22 1459  BP: 102/71  Pulse: 88  Weight: 272 lb (123.4 kg)    Fetal Status: Fetal Heart Rate (bpm): 130 Fundal Height: 36 cm Movement: Present     General:  Alert, oriented and cooperative. Patient is in no acute distress.  Skin: Skin is warm and dry. No rash noted.   Cardiovascular: Normal heart rate noted  Respiratory: Normal respiratory effort, no problems with respiration noted  Abdomen: Soft, gravid, appropriate for gestational age.  Pain/Pressure: Present     Pelvic: Cervical exam performed Dilation: Fingertip Effacement (%): 20 Station: -3  Extremities: Normal range of motion.     Mental Status:  Normal mood and affect. Normal behavior. Normal judgment and thought content.   Assessment and Plan:  Pregnancy: G2P0010 at [redacted]w[redacted]d  1. [redacted] weeks gestation of pregnancy   2. History of Roux-en-Y  gastric bypass   3. Encounter for supervision of other normal pregnancy, third trimester  Continue routine prenatal care  IOL at 37 weeks  - Cervicovaginal ancillary only( [facility]) - Culture, beta strep (group b only)  4. Fetal growth restriction antepartum Reviewed last fetal testing and growth scans, IOL recommended at 37 weeks due to EFW at 1.3%  5. BMI 45.0-49.9, adult (HCC)   6. Obesity affecting pregnancy in third trimester, unspecified obesity type   Preterm labor symptoms and general obstetric precautions including but not limited to vaginal bleeding, contractions, leaking of fluid and fetal movement were reviewed in detail with the patient.  Please refer to After Visit Summary for other counseling recommendations.   No follow-ups on file.   Mariel Aloe, MD Faculty Attending Center for [facility]

## 2022-06-02 ENCOUNTER — Encounter (HOSPITAL_COMMUNITY): Payer: Medicaid Other

## 2022-06-02 LAB — CERVICOVAGINAL ANCILLARY ONLY
Chlamydia: NEGATIVE
Comment: NEGATIVE
Comment: NORMAL
Neisseria Gonorrhea: NEGATIVE

## 2022-06-02 LAB — CULTURE, BETA STREP (GROUP B ONLY): Strep Gp B Culture: NEGATIVE

## 2022-06-03 ENCOUNTER — Ambulatory Visit: Payer: Medicaid Other | Admitting: *Deleted

## 2022-06-03 ENCOUNTER — Ambulatory Visit: Payer: Medicaid Other | Attending: Obstetrics and Gynecology

## 2022-06-03 VITALS — BP 107/57 | HR 105

## 2022-06-03 DIAGNOSIS — O365990 Maternal care for other known or suspected poor fetal growth, unspecified trimester, not applicable or unspecified: Secondary | ICD-10-CM | POA: Insufficient documentation

## 2022-06-03 DIAGNOSIS — O0933 Supervision of pregnancy with insufficient antenatal care, third trimester: Secondary | ICD-10-CM

## 2022-06-03 DIAGNOSIS — Z362 Encounter for other antenatal screening follow-up: Secondary | ICD-10-CM

## 2022-06-03 DIAGNOSIS — O99213 Obesity complicating pregnancy, third trimester: Secondary | ICD-10-CM

## 2022-06-03 DIAGNOSIS — O365930 Maternal care for other known or suspected poor fetal growth, third trimester, not applicable or unspecified: Secondary | ICD-10-CM

## 2022-06-03 DIAGNOSIS — Z3A36 36 weeks gestation of pregnancy: Secondary | ICD-10-CM | POA: Diagnosis not present

## 2022-06-03 DIAGNOSIS — E669 Obesity, unspecified: Secondary | ICD-10-CM | POA: Diagnosis not present

## 2022-06-03 DIAGNOSIS — O99843 Bariatric surgery status complicating pregnancy, third trimester: Secondary | ICD-10-CM

## 2022-06-03 NOTE — Procedures (Signed)
Emily Frank 02-28-1991 [redacted]w[redacted]d  Fetus A Non-Stress Test Interpretation for 06/03/22  Indication: IUGR and Obesity  Fetal Heart Rate A Mode: External Baseline Rate (A): 135 bpm Variability: Moderate Accelerations: 15 x 15 Decelerations: None Multiple birth?: No  Uterine Activity Mode: Palpation, Toco Contraction Frequency (min): Occas UI Contraction Quality: Mild  Interpretation (Fetal Testing) Nonstress Test Interpretation: Reactive Comments: Dr. Judeth Cornfield reviewed tracing.

## 2022-06-04 ENCOUNTER — Non-Acute Institutional Stay (HOSPITAL_COMMUNITY)
Admission: RE | Admit: 2022-06-04 | Discharge: 2022-06-04 | Disposition: A | Discharge status: Home / Self Care | Payer: Medicaid Other | Source: Ambulatory Visit | Attending: Internal Medicine | Admitting: Internal Medicine

## 2022-06-04 ENCOUNTER — Ambulatory Visit (INDEPENDENT_AMBULATORY_CARE_PROVIDER_SITE_OTHER): Payer: Medicaid Other

## 2022-06-04 ENCOUNTER — Encounter: Payer: Medicaid Other | Admitting: Obstetrics

## 2022-06-04 VITALS — BP 127/77 | HR 103 | Wt 274.0 lb

## 2022-06-04 DIAGNOSIS — Z3483 Encounter for supervision of other normal pregnancy, third trimester: Secondary | ICD-10-CM

## 2022-06-04 DIAGNOSIS — O365990 Maternal care for other known or suspected poor fetal growth, unspecified trimester, not applicable or unspecified: Secondary | ICD-10-CM | POA: Diagnosis not present

## 2022-06-04 DIAGNOSIS — Z3A00 Weeks of gestation of pregnancy not specified: Secondary | ICD-10-CM | POA: Diagnosis not present

## 2022-06-04 DIAGNOSIS — O99019 Anemia complicating pregnancy, unspecified trimester: Secondary | ICD-10-CM | POA: Insufficient documentation

## 2022-06-04 DIAGNOSIS — D508 Other iron deficiency anemias: Secondary | ICD-10-CM | POA: Diagnosis not present

## 2022-06-04 DIAGNOSIS — D649 Anemia, unspecified: Secondary | ICD-10-CM | POA: Insufficient documentation

## 2022-06-04 DIAGNOSIS — Z3A36 36 weeks gestation of pregnancy: Secondary | ICD-10-CM

## 2022-06-04 MED ORDER — SODIUM CHLORIDE 0.9 % IV SOLN: INTRAVENOUS | Status: DC | PRN

## 2022-06-04 MED ORDER — SODIUM CHLORIDE 0.9 % IV SOLN
500.0000 mg | Freq: Once | INTRAVENOUS | Status: AC
  Administered 2022-06-04: 500 mg via INTRAVENOUS
  Filled 2022-06-04: qty 25

## 2022-06-04 NOTE — Progress Notes (Signed)
PATIENT CARE CENTER:  Provider: Coral Ceo MD  Diagnosis: anemia in pregnancy  Procedure: Venofer 500mg  infusion  Patient received via PIV Venofer ( dose #2 of 2). Premedications not required per orders. Tolerated well, vitals stable. Pt declined AVS. Patient alert, oriented, and ambulatory at the time of discharge.

## 2022-06-04 NOTE — Progress Notes (Signed)
   HIGH-RISK PREGNANCY OFFICE VISIT  Patient name: Emily Frank MRN [id number]  Date of birth: December 17, 1990 Chief Complaint:   Routine Prenatal Visit  Subjective:   Emily Frank is a 31 y.o. G2P0010 female at [redacted]w[redacted]d with an Estimated Date of Delivery: 06/27/22 being seen today for ongoing management of a high-risk pregnancy aeb has PCOS (polycystic ovarian syndrome); Morbid obesity (HCC); Depression; B12 deficiency; Insulin resistance; Panic attacks; MDD (major depressive disorder), recurrent episode, severe (HCC); History of Roux-en-Y gastric bypass; Encounter for supervision of other normal pregnancy, third trimester; Fetal growth restriction antepartum; BMI 45.0-49.9, adult (HCC); and Obesity affecting pregnancy in third trimester on their problem list.  Patient presents today with no complaints.  Patient endorses fetal movement. Patient denies abdominal cramping or contractions, but reports BH Contractions.  Patient denies vaginal concerns including abnormal discharge, leaking of fluid, and bleeding. No issues with urination, constipation, or diarrhea.   Contractions: Irritability. Vag. Bleeding: None.  Movement: Present.  Reviewed past medical,surgical, social, obstetrical and family history as well as problem list, medications and allergies.  Objective   Vitals:   06/04/22 1358  BP: 127/77  Pulse: (!) 103  Weight: 274 lb (124.3 kg)  Body mass index is 45.6 kg/m.  Total Weight Gain:14 lb (6.35 kg)         Physical Examination:   General appearance: Well appearing, and in no distress  Mental status: Alert, oriented to person, place, and time  Skin: Warm & dry  Cardiovascular: Normal heart rate noted  Respiratory: Normal respiratory effort, no distress  Abdomen: Soft, gravid, nontender, Not assessed    Pelvic: Cervical exam deferred           Extremities: Edema: None  Fetal Status: Fetal Heart Rate (bpm): 137  Movement: Present   No results found for  this or any previous visit (from the past 24 hour(s)).  Assessment & Plan:  High-risk pregnancy of a 31 y.o., G2P0010 at [redacted]w[redacted]d with an Estimated Date of Delivery: 06/27/22   1. Encounter for supervision of other normal pregnancy, third trimester -Anticipatory guidance for upcoming appts. -Patient to schedule next appt in 6 weeks for an in-person or virtual visit.  2. [redacted] weeks gestation of pregnancy -Doing Well. In high spirits.   3. Fetal growth restriction antepartum -1st%ile  -Discussed induction that is scheduled for Friday Nov 24th -Reviewed induction methods including cytotec dosing and balloon dosing.   4. Iron deficiency anemia secondary to inadequate dietary iron intake -Venofeer infusion completed today.     Meds: No orders of the defined types were placed in this encounter.  Labs/procedures today:  Lab Orders  No laboratory test(s) ordered today     Reviewed: Preterm labor symptoms and general obstetric precautions including but not limited to vaginal bleeding, contractions, leaking of fluid and fetal movement were reviewed in detail with the patient.  All questions were answered.  Follow-up: No follow-ups on file.  No orders of the defined types were placed in this encounter.  Cherre Robins MSN, CNM 06/04/2022

## 2022-06-06 ENCOUNTER — Inpatient Hospital Stay (HOSPITAL_COMMUNITY): Payer: Medicaid Other

## 2022-06-06 ENCOUNTER — Inpatient Hospital Stay (HOSPITAL_COMMUNITY)
Admission: AD | Admit: 2022-06-06 | Discharge: 2022-06-10 | DRG: 788 | Disposition: A | Discharge status: Home / Self Care | Payer: Medicaid Other | Attending: Family Medicine | Admitting: Family Medicine

## 2022-06-06 ENCOUNTER — Encounter (HOSPITAL_COMMUNITY): Payer: Self-pay | Admitting: Obstetrics and Gynecology

## 2022-06-06 ENCOUNTER — Other Ambulatory Visit: Payer: Self-pay

## 2022-06-06 DIAGNOSIS — O365931 Maternal care for other known or suspected poor fetal growth, third trimester, fetus 1: Secondary | ICD-10-CM | POA: Diagnosis not present

## 2022-06-06 DIAGNOSIS — O99019 Anemia complicating pregnancy, unspecified trimester: Secondary | ICD-10-CM | POA: Diagnosis present

## 2022-06-06 DIAGNOSIS — O26893 Other specified pregnancy related conditions, third trimester: Secondary | ICD-10-CM | POA: Diagnosis not present

## 2022-06-06 DIAGNOSIS — O365990 Maternal care for other known or suspected poor fetal growth, unspecified trimester, not applicable or unspecified: Secondary | ICD-10-CM | POA: Diagnosis present

## 2022-06-06 DIAGNOSIS — Z9884 Bariatric surgery status: Secondary | ICD-10-CM

## 2022-06-06 DIAGNOSIS — O9902 Anemia complicating childbirth: Secondary | ICD-10-CM | POA: Diagnosis not present

## 2022-06-06 DIAGNOSIS — Z3490 Encounter for supervision of normal pregnancy, unspecified, unspecified trimester: Secondary | ICD-10-CM

## 2022-06-06 DIAGNOSIS — O365930 Maternal care for other known or suspected poor fetal growth, third trimester, not applicable or unspecified: Principal | ICD-10-CM | POA: Diagnosis present

## 2022-06-06 DIAGNOSIS — O99213 Obesity complicating pregnancy, third trimester: Secondary | ICD-10-CM | POA: Diagnosis present

## 2022-06-06 DIAGNOSIS — Z3A37 37 weeks gestation of pregnancy: Secondary | ICD-10-CM

## 2022-06-06 DIAGNOSIS — Z98891 History of uterine scar from previous surgery: Principal | ICD-10-CM

## 2022-06-06 DIAGNOSIS — O99844 Bariatric surgery status complicating childbirth: Secondary | ICD-10-CM | POA: Diagnosis present

## 2022-06-06 DIAGNOSIS — O99214 Obesity complicating childbirth: Secondary | ICD-10-CM | POA: Diagnosis not present

## 2022-06-06 LAB — CBC
HCT: 30.5 % — ABNORMAL LOW (ref 36.0–46.0)
Hemoglobin: 9.1 g/dL — ABNORMAL LOW (ref 12.0–15.0)
MCH: 23.8 pg — ABNORMAL LOW (ref 26.0–34.0)
MCHC: 29.8 g/dL — ABNORMAL LOW (ref 30.0–36.0)
MCV: 79.8 fL — ABNORMAL LOW (ref 80.0–100.0)
Platelets: 292 10*3/uL (ref 150–400)
RBC: 3.82 MIL/uL — ABNORMAL LOW (ref 3.87–5.11)
RDW: 21.5 % — ABNORMAL HIGH (ref 11.5–15.5)
WBC: 8.8 10*3/uL (ref 4.0–10.5)
nRBC: 0.7 % — ABNORMAL HIGH (ref 0.0–0.2)

## 2022-06-06 LAB — TYPE AND SCREEN
ABO/RH(D): O POS
Antibody Screen: NEGATIVE

## 2022-06-06 LAB — RPR: RPR Ser Ql: NONREACTIVE

## 2022-06-06 MED ORDER — OXYTOCIN-SODIUM CHLORIDE 30-0.9 UT/500ML-% IV SOLN
2.5000 [IU]/h | INTRAVENOUS | Status: DC
  Administered 2022-06-07: 2.5 [IU]/h via INTRAVENOUS
  Filled 2022-06-06: qty 500

## 2022-06-06 MED ORDER — OXYCODONE-ACETAMINOPHEN 5-325 MG PO TABS: 1.0000 | ORAL_TABLET | ORAL | Status: DC | PRN

## 2022-06-06 MED ORDER — TERBUTALINE SULFATE 1 MG/ML IJ SOLN: 0.2500 mg | Freq: Once | INTRAMUSCULAR | Status: DC | PRN

## 2022-06-06 MED ORDER — ONDANSETRON HCL 4 MG/2ML IJ SOLN
4.0000 mg | Freq: Four times a day (QID) | INTRAMUSCULAR | Status: DC | PRN
  Administered 2022-06-07: 4 mg via INTRAVENOUS

## 2022-06-06 MED ORDER — OXYTOCIN BOLUS FROM INFUSION: 333.0000 mL | Freq: Once | INTRAVENOUS | Status: DC

## 2022-06-06 MED ORDER — OXYTOCIN-SODIUM CHLORIDE 30-0.9 UT/500ML-% IV SOLN
1.0000 m[IU]/min | INTRAVENOUS | Status: DC
  Administered 2022-06-06: 2 m[IU]/min via INTRAVENOUS
  Administered 2022-06-06: 1 m[IU]/min via INTRAVENOUS
  Filled 2022-06-06: qty 500

## 2022-06-06 MED ORDER — CALCIUM CARBONATE ANTACID 500 MG PO CHEW
400.0000 mg | CHEWABLE_TABLET | ORAL | Status: DC | PRN
  Administered 2022-06-06: 400 mg via ORAL
  Filled 2022-06-06: qty 2

## 2022-06-06 MED ORDER — LACTATED RINGERS IV SOLN: 500.0000 mL | INTRAVENOUS | Status: DC | PRN

## 2022-06-06 MED ORDER — ACETAMINOPHEN 325 MG PO TABS: 650.0000 mg | ORAL_TABLET | ORAL | Status: DC | PRN

## 2022-06-06 MED ORDER — MISOPROSTOL 25 MCG QUARTER TABLET: 25.0000 ug | ORAL_TABLET | ORAL | Status: DC | PRN

## 2022-06-06 MED ORDER — FENTANYL CITRATE (PF) 100 MCG/2ML IJ SOLN: 50.0000 ug | INTRAMUSCULAR | Status: DC | PRN

## 2022-06-06 MED ORDER — LACTATED RINGERS IV SOLN
INTRAVENOUS | Status: DC
  Administered 2022-06-07: 125 mL via INTRAVENOUS

## 2022-06-06 MED ORDER — LIDOCAINE HCL (PF) 1 % IJ SOLN: 30.0000 mL | INTRAMUSCULAR | Status: DC | PRN

## 2022-06-06 MED ORDER — SOD CITRATE-CITRIC ACID 500-334 MG/5ML PO SOLN: 30.0000 mL | ORAL | Status: DC | PRN
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
## 2022-06-06 NOTE — Progress Notes (Signed)
Patient ID: Emily Frank, female   DOB: 1991-03-04, 31 y.o.   MRN: [id number]  Requesting cx exam; feels ctx as stronger than previously  BPs 127/84, 135/84 FHR 130s, +accels, no decels Ctx irreg with Pit at 22 mu/min Cx 4/thick/vtx -2  IUP@37 .0wks FGR  Plan to keep Pitocin going until ~1800, then take a break and let her eat/shower/nap with restart in 4 hours  Arabella Merles CNM 06/06/2022 4:50 PM

## 2022-06-06 NOTE — Progress Notes (Signed)
Patient ID: Emily Frank, female   DOB: 03-Sep-1990, 31 y.o.   MRN: [id number]  In to introduce myself to patient; cervical foley still in place; Pit at 71mu/min; denies pain  BP 126/72 FHR 120-125, +accels, no decels Ctx irreg, mild Cx deferred  IUP@37 .0wks Cx unfavorable FGR w nl dopplers  -Keep pit at low rate until foley comes out, then plan to titrate up to achieve active labor -Anticipate vag del  Arabella Merles CNM 06/06/2022

## 2022-06-07 ENCOUNTER — Encounter (HOSPITAL_COMMUNITY)
Admission: AD | Disposition: A | Discharge status: Home / Self Care | Payer: Self-pay | Source: Home / Self Care | Attending: Family Medicine

## 2022-06-07 ENCOUNTER — Other Ambulatory Visit: Payer: Self-pay

## 2022-06-07 ENCOUNTER — Encounter (HOSPITAL_COMMUNITY): Payer: Self-pay | Admitting: Obstetrics and Gynecology

## 2022-06-07 ENCOUNTER — Inpatient Hospital Stay (HOSPITAL_COMMUNITY): Payer: Medicaid Other | Admitting: Anesthesiology

## 2022-06-07 DIAGNOSIS — Z98891 History of uterine scar from previous surgery: Secondary | ICD-10-CM

## 2022-06-07 DIAGNOSIS — O365931 Maternal care for other known or suspected poor fetal growth, third trimester, fetus 1: Secondary | ICD-10-CM | POA: Diagnosis not present

## 2022-06-07 DIAGNOSIS — O9902 Anemia complicating childbirth: Secondary | ICD-10-CM | POA: Diagnosis not present

## 2022-06-07 DIAGNOSIS — Z3A37 37 weeks gestation of pregnancy: Secondary | ICD-10-CM

## 2022-06-07 DIAGNOSIS — D649 Anemia, unspecified: Secondary | ICD-10-CM | POA: Diagnosis not present

## 2022-06-07 SURGERY — Surgical Case
Anesthesia: Spinal

## 2022-06-07 MED ORDER — HYDROMORPHONE HCL 1 MG/ML IJ SOLN: 0.2500 mg | INTRAMUSCULAR | Status: DC | PRN

## 2022-06-07 MED ORDER — MORPHINE SULFATE (PF) 0.5 MG/ML IJ SOLN
INTRAMUSCULAR | Status: AC
  Filled 2022-06-07: qty 10

## 2022-06-07 MED ORDER — SODIUM CHLORIDE 0.9 % IR SOLN
Status: DC | PRN
  Administered 2022-06-07: 1

## 2022-06-07 MED ORDER — KETOROLAC TROMETHAMINE 30 MG/ML IJ SOLN
30.0000 mg | Freq: Once | INTRAMUSCULAR | Status: AC | PRN
  Administered 2022-06-07: 30 mg via INTRAVENOUS

## 2022-06-07 MED ORDER — FENTANYL CITRATE (PF) 100 MCG/2ML IJ SOLN
INTRAMUSCULAR | Status: AC
  Filled 2022-06-07: qty 2

## 2022-06-07 MED ORDER — METHYLPREDNISOLONE SODIUM SUCC 125 MG IJ SOLR: 125.0000 mg | Freq: Once | INTRAMUSCULAR | Status: DC | PRN

## 2022-06-07 MED ORDER — KETOROLAC TROMETHAMINE 30 MG/ML IJ SOLN: 30.0000 mg | Freq: Four times a day (QID) | INTRAMUSCULAR | Status: AC | PRN

## 2022-06-07 MED ORDER — ACETAMINOPHEN 500 MG PO TABS: 1000.0000 mg | ORAL_TABLET | Freq: Four times a day (QID) | ORAL | Status: DC

## 2022-06-07 MED ORDER — COCONUT OIL OIL: 1.0000 | TOPICAL_OIL | Status: DC | PRN

## 2022-06-07 MED ORDER — SIMETHICONE 80 MG PO CHEW: 80.0000 mg | CHEWABLE_TABLET | ORAL | Status: DC | PRN

## 2022-06-07 MED ORDER — DIBUCAINE (PERIANAL) 1 % EX OINT: 1.0000 | TOPICAL_OINTMENT | CUTANEOUS | Status: DC | PRN

## 2022-06-07 MED ORDER — PHENYLEPHRINE 80 MCG/ML (10ML) SYRINGE FOR IV PUSH (FOR BLOOD PRESSURE SUPPORT)
PREFILLED_SYRINGE | INTRAVENOUS | Status: AC
  Filled 2022-06-07: qty 10

## 2022-06-07 MED ORDER — DEXAMETHASONE SODIUM PHOSPHATE 10 MG/ML IJ SOLN
INTRAMUSCULAR | Status: AC
  Filled 2022-06-07: qty 1

## 2022-06-07 MED ORDER — OXYTOCIN-SODIUM CHLORIDE 30-0.9 UT/500ML-% IV SOLN
INTRAVENOUS | Status: DC | PRN
  Administered 2022-06-07: 250 mL via INTRAVENOUS

## 2022-06-07 MED ORDER — WITCH HAZEL-GLYCERIN EX PADS: 1.0000 | MEDICATED_PAD | CUTANEOUS | Status: DC | PRN

## 2022-06-07 MED ORDER — ALBUTEROL SULFATE (2.5 MG/3ML) 0.083% IN NEBU: 2.5000 mg | INHALATION_SOLUTION | Freq: Once | RESPIRATORY_TRACT | Status: DC | PRN

## 2022-06-07 MED ORDER — OXYCODONE HCL 5 MG/5ML PO SOLN: 5.0000 mg | Freq: Once | ORAL | Status: DC | PRN

## 2022-06-07 MED ORDER — SODIUM CHLORIDE 0.9 % IV SOLN: INTRAVENOUS | Status: DC | PRN

## 2022-06-07 MED ORDER — DIPHENHYDRAMINE HCL 25 MG PO CAPS
25.0000 mg | ORAL_CAPSULE | Freq: Four times a day (QID) | ORAL | Status: DC | PRN
  Filled 2022-06-07: qty 1

## 2022-06-07 MED ORDER — ONDANSETRON HCL 4 MG/2ML IJ SOLN: 4.0000 mg | Freq: Once | INTRAMUSCULAR | Status: DC | PRN

## 2022-06-07 MED ORDER — LACTATED RINGERS IV SOLN: INTRAVENOUS | Status: DC | PRN

## 2022-06-07 MED ORDER — PROPOFOL 10 MG/ML IV BOLUS
INTRAVENOUS | Status: AC
  Filled 2022-06-07: qty 20

## 2022-06-07 MED ORDER — PHENYLEPHRINE HCL-NACL 20-0.9 MG/250ML-% IV SOLN
INTRAVENOUS | Status: AC
  Filled 2022-06-07: qty 250

## 2022-06-07 MED ORDER — PRENATAL MULTIVITAMIN CH
1.0000 | ORAL_TABLET | Freq: Every day | ORAL | Status: DC
  Administered 2022-06-08 – 2022-06-10 (×2): 1 via ORAL
  Filled 2022-06-07 (×2): qty 1

## 2022-06-07 MED ORDER — SENNOSIDES-DOCUSATE SODIUM 8.6-50 MG PO TABS
2.0000 | ORAL_TABLET | Freq: Every day | ORAL | Status: DC
  Administered 2022-06-08 – 2022-06-10 (×2): 2 via ORAL
  Filled 2022-06-07 (×2): qty 2

## 2022-06-07 MED ORDER — MENTHOL 3 MG MT LOZG: 1.0000 | LOZENGE | OROMUCOSAL | Status: DC | PRN

## 2022-06-07 MED ORDER — IBUPROFEN 600 MG PO TABS: 600.0000 mg | ORAL_TABLET | Freq: Four times a day (QID) | ORAL | Status: DC

## 2022-06-07 MED ORDER — EPINEPHRINE PF 1 MG/ML IJ SOLN: 0.3000 mg | Freq: Once | INTRAMUSCULAR | Status: DC | PRN

## 2022-06-07 MED ORDER — SODIUM CHLORIDE 0.9 % IV BOLUS: 500.0000 mL | Freq: Once | INTRAVENOUS | Status: DC | PRN

## 2022-06-07 MED ORDER — AMISULPRIDE (ANTIEMETIC) 5 MG/2ML IV SOLN: 10.0000 mg | Freq: Once | INTRAVENOUS | Status: DC | PRN

## 2022-06-07 MED ORDER — OXYCODONE HCL 5 MG PO TABS
5.0000 mg | ORAL_TABLET | ORAL | Status: DC | PRN
  Administered 2022-06-08: 10 mg via ORAL
  Administered 2022-06-08 (×2): 5 mg via ORAL
  Administered 2022-06-08 – 2022-06-09 (×4): 10 mg via ORAL
  Administered 2022-06-09: 5 mg via ORAL
  Administered 2022-06-10: 10 mg via ORAL
  Administered 2022-06-10: 5 mg via ORAL
  Filled 2022-06-07 (×3): qty 2
  Filled 2022-06-07 (×5): qty 1
  Filled 2022-06-07 (×3): qty 2

## 2022-06-07 MED ORDER — SUCCINYLCHOLINE CHLORIDE 200 MG/10ML IV SOSY
PREFILLED_SYRINGE | INTRAVENOUS | Status: AC
  Filled 2022-06-07: qty 10

## 2022-06-07 MED ORDER — OXYTOCIN-SODIUM CHLORIDE 30-0.9 UT/500ML-% IV SOLN: 2.5000 [IU]/h | INTRAVENOUS | Status: AC

## 2022-06-07 MED ORDER — MEPERIDINE HCL 25 MG/ML IJ SOLN: 6.2500 mg | INTRAMUSCULAR | Status: DC | PRN

## 2022-06-07 MED ORDER — ENOXAPARIN SODIUM 60 MG/0.6ML IJ SOSY
60.0000 mg | PREFILLED_SYRINGE | INTRAMUSCULAR | Status: DC
  Administered 2022-06-08 – 2022-06-10 (×3): 60 mg via SUBCUTANEOUS
  Filled 2022-06-07 (×3): qty 0.6

## 2022-06-07 MED ORDER — DEXTROSE 5 % IV SOLN
INTRAVENOUS | Status: DC | PRN
  Administered 2022-06-07: 3 g via INTRAVENOUS

## 2022-06-07 MED ORDER — DIPHENHYDRAMINE HCL 50 MG/ML IJ SOLN: 25.0000 mg | Freq: Once | INTRAMUSCULAR | Status: DC | PRN

## 2022-06-07 MED ORDER — SODIUM CHLORIDE 0.9 % IV SOLN
INTRAVENOUS | Status: DC | PRN
  Administered 2022-06-07: 500 mg via INTRAVENOUS

## 2022-06-07 MED ORDER — KETOROLAC TROMETHAMINE 30 MG/ML IJ SOLN
30.0000 mg | Freq: Four times a day (QID) | INTRAMUSCULAR | Status: AC
  Administered 2022-06-07 – 2022-06-08 (×3): 30 mg via INTRAVENOUS
  Filled 2022-06-07 (×4): qty 1

## 2022-06-07 MED ORDER — PHENYLEPHRINE HCL-NACL 20-0.9 MG/250ML-% IV SOLN
INTRAVENOUS | Status: DC | PRN
  Administered 2022-06-07: 50 ug/min via INTRAVENOUS

## 2022-06-07 MED ORDER — ONDANSETRON HCL 4 MG/2ML IJ SOLN
4.0000 mg | Freq: Three times a day (TID) | INTRAMUSCULAR | Status: DC | PRN
  Administered 2022-06-07 – 2022-06-10 (×2): 4 mg via INTRAVENOUS
  Filled 2022-06-07 (×2): qty 2

## 2022-06-07 MED ORDER — NALBUPHINE HCL 10 MG/ML IJ SOLN
INTRAMUSCULAR | Status: AC
  Filled 2022-06-07: qty 1

## 2022-06-07 MED ORDER — ACETAMINOPHEN 10 MG/ML IV SOLN
INTRAVENOUS | Status: DC | PRN
  Administered 2022-06-07: 1000 mg via INTRAVENOUS

## 2022-06-07 MED ORDER — SODIUM CHLORIDE 0.9 % IV SOLN
500.0000 mg | Freq: Once | INTRAVENOUS | Status: DC
  Filled 2022-06-07: qty 25

## 2022-06-07 MED ORDER — MORPHINE SULFATE (PF) 0.5 MG/ML IJ SOLN
INTRAMUSCULAR | Status: DC | PRN
  Administered 2022-06-07: 150 ug via INTRATHECAL

## 2022-06-07 MED ORDER — EPHEDRINE SULFATE-NACL 50-0.9 MG/10ML-% IV SOSY
PREFILLED_SYRINGE | INTRAVENOUS | Status: DC | PRN
  Administered 2022-06-07: 5 mg via INTRAVENOUS

## 2022-06-07 MED ORDER — SIMETHICONE 80 MG PO CHEW
80.0000 mg | CHEWABLE_TABLET | Freq: Three times a day (TID) | ORAL | Status: DC
  Administered 2022-06-07 – 2022-06-10 (×8): 80 mg via ORAL
  Filled 2022-06-07 (×7): qty 1

## 2022-06-07 MED ORDER — SODIUM CHLORIDE 0.9 % IV SOLN: 500.0000 mg | INTRAVENOUS | Status: DC

## 2022-06-07 MED ORDER — TETANUS-DIPHTH-ACELL PERTUSSIS 5-2.5-18.5 LF-MCG/0.5 IM SUSY: 0.5000 mL | PREFILLED_SYRINGE | Freq: Once | INTRAMUSCULAR | Status: DC

## 2022-06-07 MED ORDER — DIPHENHYDRAMINE HCL 50 MG/ML IJ SOLN: 12.5000 mg | INTRAMUSCULAR | Status: DC | PRN

## 2022-06-07 MED ORDER — LIDOCAINE 2% (20 MG/ML) 5 ML SYRINGE
INTRAMUSCULAR | Status: AC
  Filled 2022-06-07: qty 5

## 2022-06-07 MED ORDER — STERILE WATER FOR IRRIGATION IR SOLN
Status: DC | PRN
  Administered 2022-06-07: 1

## 2022-06-07 MED ORDER — HYDROMORPHONE HCL 1 MG/ML IJ SOLN: 0.2000 mg | INTRAMUSCULAR | Status: DC | PRN

## 2022-06-07 MED ORDER — BUPIVACAINE HCL 0.25 % IJ SOLN
INTRAMUSCULAR | Status: DC | PRN
  Administered 2022-06-07: 20 mL

## 2022-06-07 MED ORDER — ONDANSETRON HCL 4 MG/2ML IJ SOLN
INTRAMUSCULAR | Status: AC
  Filled 2022-06-07: qty 2

## 2022-06-07 MED ORDER — GABAPENTIN 100 MG PO CAPS
100.0000 mg | ORAL_CAPSULE | Freq: Two times a day (BID) | ORAL | Status: DC
  Administered 2022-06-07 – 2022-06-10 (×5): 100 mg via ORAL
  Filled 2022-06-07 (×5): qty 1

## 2022-06-07 MED ORDER — NALOXONE HCL 4 MG/10ML IJ SOLN: 1.0000 ug/kg/h | INTRAVENOUS | Status: DC | PRN

## 2022-06-07 MED ORDER — FENTANYL CITRATE (PF) 100 MCG/2ML IJ SOLN
INTRAMUSCULAR | Status: DC | PRN
  Administered 2022-06-07: 15 ug via INTRATHECAL

## 2022-06-07 MED ORDER — OXYTOCIN-SODIUM CHLORIDE 30-0.9 UT/500ML-% IV SOLN
INTRAVENOUS | Status: AC
  Filled 2022-06-07: qty 500

## 2022-06-07 MED ORDER — DIPHENHYDRAMINE HCL 50 MG/ML IJ SOLN
INTRAMUSCULAR | Status: AC
  Filled 2022-06-07: qty 1

## 2022-06-07 MED ORDER — DIPHENHYDRAMINE HCL 25 MG PO CAPS
25.0000 mg | ORAL_CAPSULE | ORAL | Status: DC | PRN
  Administered 2022-06-07 – 2022-06-08 (×2): 25 mg via ORAL
  Filled 2022-06-07: qty 1

## 2022-06-07 MED ORDER — SODIUM CHLORIDE 0.9% FLUSH: 3.0000 mL | INTRAVENOUS | Status: DC | PRN

## 2022-06-07 MED ORDER — PHENYLEPHRINE HCL (PRESSORS) 10 MG/ML IV SOLN
INTRAVENOUS | Status: DC | PRN
  Administered 2022-06-07: 40 ug via INTRAVENOUS
  Administered 2022-06-07: 100 ug via INTRAVENOUS
  Administered 2022-06-07: 40 ug via INTRAVENOUS

## 2022-06-07 MED ORDER — EPHEDRINE 5 MG/ML INJ
INTRAVENOUS | Status: AC
  Filled 2022-06-07: qty 5

## 2022-06-07 MED ORDER — ZOLPIDEM TARTRATE 5 MG PO TABS
5.0000 mg | ORAL_TABLET | Freq: Once | ORAL | Status: AC
  Administered 2022-06-07: 5 mg via ORAL
  Filled 2022-06-07: qty 1

## 2022-06-07 MED ORDER — ACETAMINOPHEN 500 MG PO TABS
1000.0000 mg | ORAL_TABLET | Freq: Four times a day (QID) | ORAL | Status: AC
  Administered 2022-06-07 – 2022-06-08 (×3): 1000 mg via ORAL
  Filled 2022-06-07 (×3): qty 2

## 2022-06-07 MED ORDER — SCOPOLAMINE 1 MG/3DAYS TD PT72: 1.0000 | MEDICATED_PATCH | Freq: Once | TRANSDERMAL | Status: DC

## 2022-06-07 MED ORDER — NALBUPHINE HCL 10 MG/ML IJ SOLN
5.0000 mg | INTRAMUSCULAR | Status: DC | PRN
  Administered 2022-06-07: 5 mg via INTRAVENOUS

## 2022-06-07 MED ORDER — DIPHENHYDRAMINE HCL 50 MG/ML IJ SOLN
INTRAMUSCULAR | Status: DC | PRN
  Administered 2022-06-07: 12.5 mg via INTRAVENOUS

## 2022-06-07 MED ORDER — NALOXONE HCL 0.4 MG/ML IJ SOLN: 0.4000 mg | INTRAMUSCULAR | Status: DC | PRN

## 2022-06-07 MED ORDER — OXYCODONE HCL 5 MG PO TABS: 5.0000 mg | ORAL_TABLET | Freq: Once | ORAL | Status: DC | PRN

## 2022-06-07 MED ORDER — BUPIVACAINE IN DEXTROSE 0.75-8.25 % IT SOLN
INTRATHECAL | Status: DC | PRN
  Administered 2022-06-07: 1.6 mL via INTRATHECAL

## 2022-06-07 SURGICAL SUPPLY — 31 items
BENZOIN TINCTURE PRP APPL 2/3 (GAUZE/BANDAGES/DRESSINGS) ×1 IMPLANT
CHLORAPREP W/TINT 26 (MISCELLANEOUS) ×2 IMPLANT
CLAMP UMBILICAL CORD (MISCELLANEOUS) ×1 IMPLANT
CLOTH BEACON ORANGE TIMEOUT ST (SAFETY) ×1 IMPLANT
DRSG OPSITE POSTOP 4X10 (GAUZE/BANDAGES/DRESSINGS) ×1 IMPLANT
ELECT REM PT RETURN 9FT ADLT (ELECTROSURGICAL) ×1
ELECTRODE REM PT RTRN 9FT ADLT (ELECTROSURGICAL) ×1 IMPLANT
EXTRACTOR VACUUM M CUP 4 TUBE (SUCTIONS) IMPLANT
GAUZE SPONGE 4X4 12PLY STRL LF (GAUZE/BANDAGES/DRESSINGS) IMPLANT
GLOVE BIOGEL PI IND STRL 7.0 (GLOVE) ×3 IMPLANT
GLOVE ECLIPSE 7.0 STRL STRAW (GLOVE) ×1 IMPLANT
GOWN STRL REUS W/TWL LRG LVL3 (GOWN DISPOSABLE) ×2 IMPLANT
KIT ABG SYR 3ML LUER SLIP (SYRINGE) ×1 IMPLANT
NDL HYPO 25X5/8 SAFETYGLIDE (NEEDLE) ×1 IMPLANT
NEEDLE HYPO 22GX1.5 SAFETY (NEEDLE) ×1 IMPLANT
NEEDLE HYPO 25X5/8 SAFETYGLIDE (NEEDLE) ×1 IMPLANT
NS IRRIG 1000ML POUR BTL (IV SOLUTION) ×1 IMPLANT
PACK C SECTION WH (CUSTOM PROCEDURE TRAY) ×1 IMPLANT
PAD ABD 7.5X8 STRL (GAUZE/BANDAGES/DRESSINGS) ×1 IMPLANT
PAD OB MATERNITY 4.3X12.25 (PERSONAL CARE ITEMS) ×1 IMPLANT
RTRCTR C-SECT PINK 25CM LRG (MISCELLANEOUS) ×1 IMPLANT
STRIP CLOSURE SKIN 1/2X4 (GAUZE/BANDAGES/DRESSINGS) ×1 IMPLANT
SUT MNCRL 0 VIOLET CTX 36 (SUTURE) ×2 IMPLANT
SUT MONOCRYL 0 CTX 36 (SUTURE) ×2
SUT VIC AB 0 CTX 36 (SUTURE) ×1
SUT VIC AB 0 CTX36XBRD ANBCTRL (SUTURE) ×1 IMPLANT
SUT VIC AB 4-0 KS 27 (SUTURE) ×1 IMPLANT
SYR 30ML LL (SYRINGE) ×1 IMPLANT
TOWEL OR 17X24 6PK STRL BLUE (TOWEL DISPOSABLE) ×1 IMPLANT
TRAY FOLEY W/BAG SLVR 14FR LF (SET/KITS/TRAYS/PACK) ×1 IMPLANT
WATER STERILE IRR 1000ML POUR (IV SOLUTION) ×1 IMPLANT

## 2022-06-07 NOTE — Lactation Note (Signed)
This note was copied from a baby's chart. Lactation Consultation Note  Patient Name: Boy Kyanne Rials [id number] Date: 06/07/2022 Reason for consult: Initial assessment;Primapara;1st time breastfeeding;Early term 37-38.6wks;Breastfeeding assistance;Infant < 6lbs Age:32 hours  LC entered the room and the birth parent stated that the infant had been having trouble regulating his body temperature.  She stated that she had been told not to do any STS with the infant right now.  She has some of her breast milk stored in the fridge and stated that she would be giving the infant some at his next feeding.  The birth parent asked if the [facility] could come back later to work with her due to her being tired.  LC will follow up later.   Infant Feeding Plan:  Breastfeed 8+ times in 24 hours according to feeding cues.  Put baby to the breast at every other feeding/ prior to supplementing the infant.  Keep feedings <30 min total.  Call RN/LC for assistance with breastfeeding.   Maternal Data Does the patient have breastfeeding experience prior to this delivery?: No  Feeding Nipple Type: Extra Slow Flow  Consult Status Consult Status: Follow-up Date: 06/01/22 Follow-up type: In-patient    Delene Loll 06/07/2022, 2:31 PM

## 2022-06-07 NOTE — Progress Notes (Signed)
Patient ID: Emily Frank, female   DOB: Jun 16, 1991, 31 y.o.   MRN: [id number]  In to rupture membranes and place internal monitors simply for better tracing ability and not for fetal concerns; pt on 2nd round of Pit and up to 23mu/min; AROM for clear fluid, cx 4/thick/-3, and IUPC placed first; then first IFSE placed but it wasn't reading correctly so a second IFSE placed; approx 10 minute lapse in FHR tracing while this is taking place, however there haven't been any FHR concerns prior to this event. When the 2nd scalp electrode was hooked up, baby was noted to be in the 60s; Dr Shawnie Pons called urgently, Pitocin turned off, and the patient was repositioned with no improvement; she was taken to the OR in <3 minutes.  Arabella Merles CNM 06/07/2022 7:41 AM

## 2022-06-07 NOTE — Op Note (Signed)
Emily Frank PROCEDURE DATE: 06/07/2022  PREOPERATIVE DIAGNOSES: Intrauterine pregnancy at [redacted]w[redacted]d weeks gestation; non-reassuring fetal status, fetal bradycardia  POSTOPERATIVE DIAGNOSES: The same  PROCEDURE: Low Transverse Cesarean Section  SURGEON:  Dr. Shawnie Pons  ASSISTANT:  Dr. Salvadore Dom. An experienced assistant was required given the standard of surgical care given the complexity of the case.  This assistant was needed for exposure, dissection, suctioning, retraction, instrument exchange, assisting with delivery with administration of fundal pressure, and for overall help during the procedure.  ANESTHESIOLOGY TEAM: Anesthesiologist: Lannie Fields, DO CRNA: Graciela Husbands, CRNA; Wilder Glade, CRNA  INDICATIONS: Emily Frank is a 31 y.o. G2P1011 at [redacted]w[redacted]d here for cesarean section secondary to the indications listed under preoperative diagnoses; please see preoperative note for further details.  The risks of surgery were discussed with the patient including but were not limited to: bleeding which may require transfusion or reoperation; infection which may require antibiotics; injury to bowel, bladder, ureters or other surrounding organs; injury to the fetus; need for additional procedures including hysterectomy in the event of a life-threatening hemorrhage; formation of adhesions; placental abnormalities wth subsequent pregnancies; incisional problems; thromboembolic phenomenon and other postoperative/anesthesia complications.  The patient concurred with the proposed plan, giving informed written consent for the procedure.    FINDINGS:  Viable female infant in cephalic presentation.  Apgars 7 and 9.  Clear amniotic fluid.  Intact placenta, three vessel cord.  Normal uterus, fallopian tubes and ovaries bilaterally.  ANESTHESIA: Spinal INTRAVENOUS FLUIDS: 2200 ml   ESTIMATED BLOOD LOSS: 381 ml URINE OUTPUT:  150 ml SPECIMENS: Placenta sent to  pathology COMPLICATIONS: None immediate  PROCEDURE IN DETAIL:  The patient preoperatively received intravenous antibiotics and had sequential compression devices applied to her lower extremities.  She was then taken to the operating room where spinal anesthesia was administered and was found to be adequate. She was then placed in a dorsal supine position with a leftward tilt, and prepped and draped in a sterile manner.  A foley catheter was placed into her bladder and attached to constant gravity.  After an adequate timeout was performed, a Pfannenstiel skin incision was made with scalpel and carried through to the underlying layer of fascia. The fascia was incised in the midline, and this incision was extended bilaterally bluntly and using the Mayo scissors. The underlying rectus muscles were dissected off the fascia superiorly and inferiorly in a blunt fashion. The rectus muscles were separated in the midline and the peritoneum was entered bluntly. The Alexis self-retaining retractor was introduced into the abdominal cavity.  Attention was turned to the lower uterine segment where a low transverse hysterotomy was made with a scalpel and extended bilaterally bluntly.  The infant was successfully delivered, the cord was clamped and cut after one minute, and the infant was handed over to the awaiting neonatology team. A cord pH was obtained. Uterine massage was then administered, and the placenta delivered intact with a three-vessel cord. The uterus was then cleared of clots and debris.  The hysterotomy was closed with 0 monocryl in a running unlocked fashion. The pelvis was cleared of all clot and debris. Hemostasis was confirmed on all surfaces.  The retractor was removed.  The peritoneum was closed with a 0 monocryl running stitch. The fascia was then closed using 0 Vicryl in a running fashion.  The subcutaneous layer was irrigated, reapproximated with 2-0 plain gut running stitches, and the skin was closed  with a 4-0 Vicryl subcuticular stitch. The patient tolerated  the procedure well. Sponge, instrument and needle counts were correct x 3.  She was taken to the recovery room in stable condition.   Lavonda Jumbo, DO OB Fellow, Faculty [facility], Center for [facility] 06/07/2022, 9:09 AM

## 2022-06-07 NOTE — Anesthesia Postprocedure Evaluation (Signed)
Anesthesia Post Note  Patient: Emily Frank  Procedure(s) Performed: CESAREAN SECTION     Patient location during evaluation: PACU Anesthesia Type: Spinal Level of consciousness: awake and alert and oriented Pain management: pain level controlled Vital Signs Assessment: post-procedure vital signs reviewed and stable Respiratory status: spontaneous breathing, nonlabored ventilation and respiratory function stable Cardiovascular status: blood pressure returned to baseline and stable Postop Assessment: no headache, no backache, spinal receding and no apparent nausea or vomiting Anesthetic complications: no   No notable events documented.  Last Vitals:  Vitals:   06/07/22 1001 06/07/22 1002  BP:  117/73  Pulse: 84 84  Resp: 18 19  Temp:    SpO2: 98% 98%    Last Pain:  Vitals:   06/07/22 1002  TempSrc:   PainSc: 0-No pain   Pain Goal:    LLE Motor Response: Purposeful movement (06/07/22 1002) LLE Sensation: Decreased (06/07/22 1002) RLE Motor Response: Purposeful movement (06/07/22 1002) RLE Sensation: Decreased (06/07/22 1002)        Tennis Must [place]

## 2022-06-07 NOTE — Discharge Summary (Signed)
Postpartum Discharge Summary     Patient Name: Emily Frank DOB: [birth year] MRN: [id number]  Date of admission: 06/06/2022 Delivery date:06/07/2022  Delivering provider: Donnamae Jude  Date of discharge: 06/10/2022  Admitting diagnosis: Fetal growth restriction antepartum [O36.5990] Intrauterine pregnancy: [redacted]w[redacted]d    Secondary diagnosis:  Principal Problem:   Fetal growth restriction antepartum Active Problems:   History of Roux-en-Y gastric bypass   Obesity affecting pregnancy in third trimester   Anemia affecting pregnancy   S/P cesarean section  Additional problems: n/a    Discharge diagnosis: Term Pregnancy Delivered and Anemia                                              Post partum procedures: Venofer Augmentation: AROM, Pitocin, and IP Foley Complications: None  Hospital course: Induction of Labor With Cesarean Section   31y.o. yo G2P1011 at 32w1das admitted to the hospital 06/06/2022 for induction of labor. Patient had a labor course significant for slowed progression of labor. The patient went for cesarean section due to Non-Reassuring FHR and fetal labor intolerance . Delivery details are as follows: Membrane Rupture Time/Date: 7:20 AM ,06/07/2022   Delivery Method:C-Section, Low Transverse  Details of operation can be found in separate operative Note.  Patient had a postpartum course complicated by none. She is ambulating, tolerating a regular diet, passing flatus, and urinating well.  Patient is discharged home in stable condition on 06/10/22.      Newborn Data: Birth date:06/07/2022  Birth time:8:10 AM  Gender:Female  Living status:Living  Apgars:7 ,9  Weight:2300 g                                Magnesium Sulfate received: No BMZ received: No Rhophylac:No MMR:No T-DaP:Given prenatally Flu: Yes Transfusion:No  Physical exam  Vitals:   06/09/22 1359 06/09/22 1932 06/09/22 2047 06/10/22 0500  BP: 124/73 121/75 131/79 129/72  Pulse: 76 77  91 88  Resp: _0 Temp: 97.8 F (36.6 C) (!) 97.2 F (36.2 C) 98 F (36.7 C) 98.1 F (36.7 C)  TempSrc: Oral Oral Oral Oral  SpO2:   99%   Weight:      Height:       General: alert, cooperative, and no distress Lochia: appropriate Uterine Fundus: firm Incision: Healing well with no significant drainage, No significant erythema, Dressing is clean, dry, and intact DVT Evaluation: No evidence of DVT seen on physical exam. Labs: Lab Results  Component Value Date   WBC 14.4 (H) 06/08/2022   HGB 8.5 (L) 06/08/2022   HCT 28.0 (L) 06/08/2022   MCV 80.9 06/08/2022   PLT 277 06/08/2022      Latest Ref Rng & Units 03/19/2022    2:08 PM  CMP  Glucose 70 - 99 mg/dL 75   BUN 6 - 20 mg/dL 6   Creatinine 0.57 - 1.00 mg/dL 0.63   Sodium 134 - 144 mmol/L 136   Potassium 3.5 - 5.2 mmol/L 4.1   Chloride 96 - 106 mmol/L 101   CO2 20 - 29 mmol/L 19   Calcium 8.7 - 10.2 mg/dL 9.0   Total Protein 6.0 - 8.5 g/dL 6.8   Total Bilirubin 0.0 - 1.2 mg/dL 0.2   Alkaline Phos 44 - 121 IU/L 111  AST 0 - 40 IU/L 17   ALT 0 - 32 IU/L 11    Edinburgh Score:    06/08/2022   12:01 AM  Edinburgh Postnatal Depression Scale Screening Tool  I have been able to laugh and see the funny side of things. 0  I have looked forward with enjoyment to things. 0  I have blamed myself unnecessarily when things went wrong. 2  I have been anxious or worried for no good reason. 0  I have felt scared or panicky for no good reason. 2  Things have been getting on top of me. 0  I have been so unhappy that I have had difficulty sleeping. 0  I have felt sad or miserable. 0  I have been so unhappy that I have been crying. 0  The thought of harming myself has occurred to me. 0  Edinburgh Postnatal Depression Scale Total 4     After visit meds:  Allergies as of 06/10/2022   No Known Allergies      Medication List     STOP taking these medications    metoCLOPramide 10 MG tablet Commonly known as:  Reglan   scopolamine 1 MG/3DAYS Commonly known as: TRANSDERM-SCOP       TAKE these medications    acetaminophen 500 MG tablet Commonly known as: TYLENOL Take 2 tablets (1,000 mg total) by mouth every 6 (six) hours.   ascorbic acid 500 MG tablet Commonly known as: VITAMIN C Take 1 tablet (500 mg total) by mouth every other day. Take with iron pill   coconut oil Oil Apply 1 Application topically as needed.   famotidine 20 MG tablet Commonly known as: Pepcid Take 1 tablet (20 mg total) by mouth 2 (two) times daily.   ferrous sulfate 325 (65 FE) MG tablet Commonly known as: FerrouSul Take 1 tablet (325 mg total) by mouth every other day.   gabapentin 100 MG capsule Commonly known as: NEURONTIN Take 2 capsules (200 mg total) by mouth 3 (three) times daily.   oxyCODONE 5 MG immediate release tablet Commonly known as: Oxy IR/ROXICODONE Take 1 tablet (5 mg total) by mouth every 4 (four) hours as needed for moderate pain.   polyethylene glycol 17 g packet Commonly known as: MIRALAX / GLYCOLAX Take 17 g by mouth 2 (two) times daily.   prenatal multivitamin Tabs tablet Take 1 tablet by mouth daily.   promethazine 12.5 MG tablet Commonly known as: PHENERGAN Take 1-2 tablets (12.5-25 mg total) by mouth at bedtime as needed for nausea or vomiting.   senna-docusate 8.6-50 MG tablet Commonly known as: Senokot-S Take 2 tablets by mouth daily.               Discharge Care Instructions  (From admission, onward)           Start     Ordered   06/10/22 0000  Discharge wound care:       Comments: C-section wound care: You may feel pain/discomfort/burning sensation for several weeks. Keep the wound area clean by washing it with mild soap and water. You don't need to scrub it. Often, just letting the water run over your wound in the shower is enough. We do not recommend creams as this can cause infection, but we do recommend oral medication (prescribed), pressure dressings  and running warm water on the wound to help ease discomfort/burning pain.   06/10/22 0534             Discharge home in stable  condition Infant Feeding: Bottle and Breast Infant Disposition:home with mother Discharge instruction: per After Visit Summary and Postpartum booklet. Activity: Advance as tolerated. Pelvic rest for 6 weeks.  Diet: routine diet Future Appointments:No future appointments. Follow up Visit:  Please schedule this patient for a In person postpartum visit in 6 weeks with the following provider: MD and APP. Additional Postpartum F/U:Incision check 1 week  High risk pregnancy complicated by:  FGR, anemia, and obesity Delivery mode:  C-Section, Low Transverse  Anticipated Birth Control:  desires BTL   06/10/2022 Shelda Pal, DO

## 2022-06-07 NOTE — Transfer of Care (Signed)
Immediate Anesthesia Transfer of Care Note  Patient: Emily Frank  Procedure(s) Performed: CESAREAN SECTION  Patient Location: PACU  Anesthesia Type:Spinal  Level of Consciousness: awake, alert , oriented, and patient cooperative  Airway & Oxygen Therapy: Patient Spontanous Breathing and Patient connected to face mask oxygen  Post-op Assessment: Report given to RN and Post -op Vital signs reviewed and stable  Post vital signs: Reviewed and stable  Last Vitals:  Vitals Value Taken Time  BP    Temp    Pulse 84 06/07/22 0911  Resp 26 06/07/22 0911  SpO2 100 % 06/07/22 0911  Vitals shown include unvalidated device data.  Last Pain:  Vitals:   06/07/22 0417  TempSrc: Oral  PainSc:          Complications: No notable events documented.

## 2022-06-07 NOTE — Anesthesia Procedure Notes (Signed)
Spinal  Patient location during procedure: OR Start time: 06/07/2022 7:40 AM End time: 06/07/2022 7:44 AM Reason for block: surgical anesthesia Staffing Performed: anesthesiologist  Anesthesiologist: Lannie Fields, DO Performed by: Lannie Fields, DO Authorized by: Lannie Fields, DO   Preanesthetic Checklist Completed: patient identified, IV checked, risks and benefits discussed, surgical consent, monitors and equipment checked, pre-op evaluation and timeout performed Spinal Block Patient position: sitting Prep: DuraPrep and site prepped and draped Patient monitoring: cardiac monitor, continuous pulse ox and blood pressure Approach: midline Location: L3-4 Injection technique: single-shot Needle Needle type: Pencan  Needle gauge: 24 G Needle length: 9 cm Assessment Sensory level: T6 Events: CSF return Additional Notes Functioning IV was confirmed and monitors were applied. Sterile prep and drape, including hand hygiene and sterile gloves were used. The patient was positioned and the spine was prepped. The skin was anesthetized with lidocaine.  Free flow of clear CSF was obtained prior to injecting local anesthetic into the CSF.  The spinal needle aspirated freely following injection.  The needle was carefully withdrawn.  The patient tolerated the procedure well.

## 2022-06-07 NOTE — Anesthesia Preprocedure Evaluation (Signed)
Anesthesia Evaluation  Patient identified by MRN, date of birth, ID band Patient awake    Reviewed: Allergy & PrecautionsPreop documentation limited or incomplete due to emergent nature of procedure.  Airway Mallampati: III  TM Distance: >3 FB Neck ROM: Full    Dental no notable dental hx.    Pulmonary neg pulmonary ROS   Pulmonary exam normal breath sounds clear to auscultation       Cardiovascular negative cardio ROS Normal cardiovascular exam Rhythm:Regular Rate:Normal     Neuro/Psych  PSYCHIATRIC DISORDERS Anxiety Depression    negative neurological ROS     GI/Hepatic Neg liver ROS,,,S/p roux en y 2020   Endo/Other    Morbid obesityBMI 48  Renal/GU negative Renal ROS  negative genitourinary   Musculoskeletal negative musculoskeletal ROS (+)    Abdominal  (+) + obese  Peds negative pediatric ROS (+)  Hematology  (+) Blood dyscrasia, anemia Hb 9.1, plt 292   Anesthesia Other Findings   Reproductive/Obstetrics (+) Pregnancy                             Anesthesia Physical Anesthesia Plan  ASA: 3 and emergent  Anesthesia Plan: Spinal   Post-op Pain Management: Toradol IV (intra-op)*, Ofirmev IV (intra-op)* and Regional block*   Induction:   PONV Risk Score and Plan: 2 and Propofol infusion and TIVA  Airway Management Planned: Natural Airway and Nasal Cannula  Additional Equipment: None  Intra-op Plan:   Post-operative Plan:   Informed Consent: I have reviewed the patients History and Physical, chart, labs and discussed the procedure including the risks, benefits and alternatives for the proposed anesthesia with the patient or authorized representative who has indicated his/her understanding and acceptance.       Plan Discussed with: CRNA  Anesthesia Plan Comments: (Code caesarean called for dec FHR when scalp electrode placed, no epidural in place at the time. While  readying for general anesthesia, FHR hooked up in OR and FHR in 130s. Decision w/ Dr. Shawnie Pons to cancel code and proceed with spinal anesthetic in controlled fashion.)       Anesthesia Quick Evaluation

## 2022-06-07 NOTE — Progress Notes (Addendum)
Patient ID: Emily Frank, female   DOB: [birth year], 31 y.o.   MRN: [id number]  Had Pitocin break from 1800-2200; now trying to get some rest (peeked in room; didn't disturb)  VSS, afebrile FHR 120-130s, +accels, occ mi variables Ctx not tracing with Pit at 46mu/min Cx deferred (was 4/50 last exam)  IUP@37 .1wks FGR IOL process  Continue to uptitrate Pitocin to achieve adequate labor Ambien 5mg  for sleep Still planning for vag del  [facility] 06/07/2022 2:11 AM

## 2022-06-08 LAB — CBC
HCT: 28 % — ABNORMAL LOW (ref 36.0–46.0)
Hemoglobin: 8.5 g/dL — ABNORMAL LOW (ref 12.0–15.0)
MCH: 24.6 pg — ABNORMAL LOW (ref 26.0–34.0)
MCHC: 30.4 g/dL (ref 30.0–36.0)
MCV: 80.9 fL (ref 80.0–100.0)
Platelets: 277 10*3/uL (ref 150–400)
RBC: 3.46 MIL/uL — ABNORMAL LOW (ref 3.87–5.11)
RDW: 21.5 % — ABNORMAL HIGH (ref 11.5–15.5)
WBC: 14.4 10*3/uL — ABNORMAL HIGH (ref 4.0–10.5)
nRBC: 0.1 % (ref 0.0–0.2)

## 2022-06-08 MED ORDER — ACETAMINOPHEN 500 MG PO TABS
1000.0000 mg | ORAL_TABLET | Freq: Four times a day (QID) | ORAL | Status: DC
  Administered 2022-06-08 – 2022-06-10 (×8): 1000 mg via ORAL
  Filled 2022-06-08 (×10): qty 2

## 2022-06-08 MED ORDER — SODIUM CHLORIDE 0.9 % IV SOLN
500.0000 mg | Freq: Once | INTRAVENOUS | Status: AC
  Administered 2022-06-08: 500 mg via INTRAVENOUS
  Filled 2022-06-08: qty 25

## 2022-06-08 NOTE — Lactation Note (Signed)
This note was copied from a baby's chart. Lactation Consultation Note  Patient Name: Emily Frank [id number] Date: 06/08/2022 Reason for consult: Follow-up assessment;Early term 37-38.6wks;Infant < 6lbs;Primapara;1st time breastfeeding;NICU baby Age:32 hours   P1: Early term infant at 37+1 weeks Feeding preference: Breast/formula  Visited with mother in postpartum room 502.  Reviewed breast massage and hand expression; mother able to easily express colostrum drops.  She has been collected drops in syringes.  Offered to initiate the electric pump; mother receptive.  Pump parts, assembly and cleaning reviewed.  #24 flanges are appropriate at this time.  Observed mother pumping for 15 minutes and few drops obtained.  Encouraged using the drops for nipple care and comfort.  Taught mother how to assess for correct flange size.  Discussed pumping every three hours and performing breast massage and hand expression before/after pumping.  Encouraged to pump in the NICU when desired and how to bring pump parts to NICU with her for easy pumping.  Went to NICU and spoke with father who was at the bedside.  Spoke with RN for an update on "Phore Lindan."  Mother appreciative and encouraged to hear the update.   Maternal Data Has patient been taught Hand Expression?: Yes Does the patient have breastfeeding experience prior to this delivery?: No  Feeding Mother's Current Feeding Choice: Breast Milk and Formula Nipple Type: Nfant Slow Flow (purple)  LATCH Score                    Lactation Tools Discussed/Used Tools: Pump;Flanges Flange Size: 24 Breast pump type: Double-Electric Breast Pump;Manual Pump Education: Setup, frequency, and cleaning;Milk Storage Reason for Pumping: NICU baby; breast stimulation for supplementation Pumping frequency: Every three hours Pumped volume:  (Few drops)  Interventions Interventions: Education;Hand pump;Expressed  milk;DEBP  Discharge Pump: Personal;Hands Free (Medela and a wireless pump)  Consult Status Consult Status: Follow-up Date: 06/09/22 Follow-up type: In-patient    Dora Sims 06/08/2022, 1:43 PM

## 2022-06-08 NOTE — Progress Notes (Signed)
POSTPARTUM PROGRESS NOTE  POD #1  Subjective:  Emily Frank is a 31 y.o. G2P1011 s/p pLTCS at [redacted]w[redacted]d.  She reports she doing well. No acute events overnight. She reports she is doing well. She denies any problems with ambulating, voiding or po intake. Denies nausea or vomiting. She has  passed flatus. Pain is well controlled.  Lochia is minimal. Baby is in the NICU and she has been able to walk down there.  Objective: Blood pressure 124/77, pulse 85, temperature 98.3 F (36.8 C), temperature source Oral, resp. rate 18, height 5\' 4"  (1.626 m), weight 125.5 kg, last menstrual period 07/16/2021, SpO2 100 %, unknown if currently breastfeeding.  Physical Exam:  General: alert, cooperative and no distress Chest: no respiratory distress Heart:regular rate, distal pulses intact Abdomen: soft, nontender,  Uterine Fundus: firm, appropriately tender DVT Evaluation: No calf swelling or tenderness Extremities: mild edema Skin: warm, dry; incision clean/dry/intact w/ pressure and honeycomb dressing in place  Recent Labs    06/06/22 0700 06/08/22 0432  HGB 9.1* 8.5*  HCT 30.5* 28.0*    Assessment/Plan: Emily Frank is a 31 y.o. G2P1011 s/p pLTCS at [redacted]w[redacted]d for fetal bradycardia.  POD#1 - Doing welll; pain is well controlled. H/H appropriate  Routine postpartum care  OOB, ambulated  Lovenox for VTE prophylaxis Anemia: asymptomatic  She is s/p RenY bypass, and hence not a good candidate for PO iron. Will given IV iron.  Contraception: desires interval BTL Feeding: breast  Dispo: Plan for discharge in 1-2 days..   LOS: 2 days   [redacted]w[redacted]d MD MPH OB Fellow, Faculty [facility], Center for [facility] 06/08/2022

## 2022-06-09 MED ORDER — POLYETHYLENE GLYCOL 3350 17 G PO PACK
17.0000 g | PACK | Freq: Every day | ORAL | Status: DC
  Administered 2022-06-09: 17 g via ORAL
  Filled 2022-06-09: qty 1

## 2022-06-09 MED ORDER — HYDROMORPHONE HCL 1 MG/ML IJ SOLN
1.0000 mg | Freq: Once | INTRAMUSCULAR | Status: AC
  Administered 2022-06-09: 1 mg via INTRAVENOUS
  Filled 2022-06-09: qty 1

## 2022-06-09 NOTE — Progress Notes (Signed)
Called to bedside for increasing abdominal pain s/p LTCS refractory to oxy and tylenol. Patient states incision feels more warm to her and that she does not feel like herself. She also endorses cramping in her abdomen and has had to strain hard for a bowel movement today. She has been ambulating well today back and forth from the NICU, as well. I wonder if her pain is secondary to difficulty stooling +/- incisional pain after ambulating extensively today. I am less concerned for incisional infection given the area is not erythematous, excessively warm, or showing signs of drainage or bleeding. Will order miralax daily to add on her stool regimen as well as one time 1mg  IV dilaudid and assess improvement.

## 2022-06-09 NOTE — Lactation Note (Signed)
This note was copied from a baby's chart.  NICU Lactation Consultation Note  Patient Name: Emily Frank [id number] Date: 06/09/2022 Age:32 hours  Subjective Reason for consult: Follow-up assessment; Primapara; 1st time breastfeeding; NICU baby; Early term 37-38.6wks; Infant < 6lbs; Maternal endocrine disorder; Other (Comment) (SGA)  Visited with family of 19 hours old ETI NICU female; [name] is a P1 and reports she's been pumping but not consistently because she has also been putting baby to breast. Explained the importance of pumping after feedings at the breast for the onset of lactogenesis II and to protect her supply, she voiced understanding. [name] was trying to feed baby a bottle, but he was falling asleep and started to spit up the milk. Reviewed the IDF culture and the importance of offering PO feeds when baby is awake and alert in order to be protective of his airways. She's expecting to be discharged tomorrow and will be moving into baby's room.  Objective Infant data: Mother's Current Feeding Choice: Breast Milk and Formula  Infant feeding assessment Scale for Readiness: 1 Scale for Quality: 2  Maternal data: G2P1011  C-Section, Low Transverse Current breast feeding challenges:: NICU admission Does the patient have breastfeeding experience prior to this delivery?: No Pumping frequency: 2 times/24 hours; but she's also been taking baby to breast 4 times/24 hours Pumped volume: 0 mL (drops) Flange Size: 24 Risk factor for low milk supply:: primipara, blood loss of 381 cc., infant separation Pump: Personal, Hands Free (Medela and a wireless pump)  Assessment Infant: LATCH Score: 8  Feeding Status: Ad lib  Maternal: Milk volume: Normal  Intervention/Plan Interventions: Breast feeding basics reviewed; DEBP; Education; Infant Driven Feeding Algorithm education Tools: Pump; Flanges Pump Education: Setup, frequency, and cleaning; Milk Storage  Plan of  care: Encouraged pumping every 3 hours, ideally 8 pumping sessions/24 hours She'll continue taking baby to breast on feeding cues, ideally STS to aid improve blood sugars Parents will continue working on bottle feedings  FOB present and supportive. All questions and concerns answered, family to contact [facility] Ed services PRN.  Consult Status: NICU follow-up  NICU Follow-up type: Maternal D/C visit; Verify onset of copious milk; Verify absence of engorgement   Aalijah Mims S Jhania Etherington 06/09/2022, 11:18 AM

## 2022-06-09 NOTE — Progress Notes (Addendum)
POSTPARTUM PROGRESS NOTE  Subjective:  Emily Frank is a 31 y.o. G2P1011 POD#2 s/p pLTCS at [redacted]w[redacted]d. No acute events overnight. She denies any problems with ambulating, voiding or po intake. She has passed flatus. Pain is currently not controlled on oxy 10 mg and tylenol. Baby is in the NICU and she has been able to walk down there.  Objective: Blood pressure 114/60, pulse 84, temperature 98 F (36.7 C), temperature source Oral, resp. rate 18, height 5\' 4"  (1.626 m), weight 125.5 kg, last menstrual period 07/16/2021, SpO2 100 %, unknown if currently breastfeeding.  Physical Exam:  General: alert, cooperative, in pain, appears slightly sedated Chest: no respiratory distress Heart:regular rate Abdomen: tender to light manipulation Uterine Fundus: difficult to assess given pain levels DVT Evaluation: No calf swelling or tenderness Skin: warm, dry; incision clean/dry/intact w/ honeycomb dressing in place  Recent Labs    06/08/22 0432  HGB 8.5*  HCT 28.0*   Assessment/Plan: Emily Frank is a 31 y.o. G2P1011 s/p pLTCS at [redacted]w[redacted]d for fetal bradycardia.  POD#1 - Doing well; pain is not controlled. Will continue oxy 10 mg and scheduled tylenol for now in addition to adding abdominal binder. Consider IV dilaudid for breakthrough pain if needed though holding off now given sedated appearance. Routine postpartum care OOB, ambulating well Lovenox for VTE prophylaxis  Anemia: s/p IV venofer  Contraception: desires interval BTL Feeding: breast  Dispo: Plan for discharge possibly tomorrow if pain control is adequate.   LOS: 3 days   [redacted]w[redacted]d, MD 06/09/2022    Fellow Attestation  I saw and evaluated the patient, performing the key elements of the service.I  personally performed or re-performed the history, physical exam, and medical decision making activities of this service and have verified that the service and findings are accurately documented in the  resident's note. I developed the management plan that is described in the resident's note, and I agree with the content, with my edits above.    06/11/2022, MD OB Fellow

## 2022-06-09 NOTE — Progress Notes (Signed)
MOB was referred for history of depression/anxiety. * Referral screened out by Clinical Social Worker because none of the following criteria appear to apply: ~ History of anxiety/depression during this pregnancy, or of post-partum depression. ~ Diagnosis of anxiety and/or depression within last 3 years OR * MOB's symptoms currently being treated with medication and/or therapy. No concerns noted in OB record. MOB's Edinburgh Score is 4.  Patient screened out for psychosocial assessment since none of the following apply: Psychosocial stressors documented in mother or baby's chart Gestation less than 32 weeks Code at delivery  Infant with anomalies Please contact the Clinical Social Worker if specific needs arise, by MOB's request, or if MOB scores greater than 9/yes to question 10 on Edinburgh Postpartum Depression Screen.  Please contact the Clinical Social Worker if needs arise, or if MOB requests.  Blaine Hamper, MSW, LCSW Clinical Social Work [phone]

## 2022-06-10 ENCOUNTER — Other Ambulatory Visit (HOSPITAL_COMMUNITY): Payer: Self-pay

## 2022-06-10 ENCOUNTER — Other Ambulatory Visit (HOSPITAL_COMMUNITY): Payer: Medicaid Other

## 2022-06-10 ENCOUNTER — Inpatient Hospital Stay (HOSPITAL_COMMUNITY): Payer: Medicaid Other

## 2022-06-10 DIAGNOSIS — R109 Unspecified abdominal pain: Secondary | ICD-10-CM | POA: Diagnosis not present

## 2022-06-10 MED ORDER — POLYETHYLENE GLYCOL 3350 17 G PO PACK
17.0000 g | PACK | Freq: Two times a day (BID) | ORAL | Status: DC
  Administered 2022-06-10: 17 g via ORAL
  Filled 2022-06-10: qty 1

## 2022-06-10 MED ORDER — ACETAMINOPHEN 500 MG PO TABS
1000.0000 mg | ORAL_TABLET | Freq: Four times a day (QID) | ORAL | 0 refills | Status: DC
  Filled 2022-06-10: qty 30, 4d supply, fill #0

## 2022-06-10 MED ORDER — GABAPENTIN 100 MG PO CAPS
200.0000 mg | ORAL_CAPSULE | Freq: Three times a day (TID) | ORAL | 0 refills | Status: DC
  Filled 2022-06-10: qty 15, 3d supply, fill #0

## 2022-06-10 MED ORDER — POLYETHYLENE GLYCOL 3350 17 GM/SCOOP PO POWD
17.0000 g | Freq: Two times a day (BID) | ORAL | 0 refills | Status: DC
  Filled 2022-06-10: qty 238, 7d supply, fill #0

## 2022-06-10 MED ORDER — OXYCODONE HCL 5 MG PO TABS
5.0000 mg | ORAL_TABLET | Freq: Once | ORAL | Status: AC
  Administered 2022-06-10: 5 mg via ORAL
  Filled 2022-06-10: qty 1

## 2022-06-10 MED ORDER — GABAPENTIN 100 MG PO CAPS
200.0000 mg | ORAL_CAPSULE | Freq: Three times a day (TID) | ORAL | Status: DC
  Administered 2022-06-10: 200 mg via ORAL
  Filled 2022-06-10 (×2): qty 2

## 2022-06-10 MED ORDER — SCOPOLAMINE 1 MG/3DAYS TD PT72
1.0000 | MEDICATED_PATCH | Freq: Once | TRANSDERMAL | 0 refills | Status: AC
  Filled 2022-06-10: qty 1, 1d supply, fill #0

## 2022-06-10 MED ORDER — COCONUT OIL OIL
1.0000 | TOPICAL_OIL | 0 refills | Status: DC | PRN
  Filled 2022-06-10: qty 210, fill #0

## 2022-06-10 MED ORDER — SENNOSIDES-DOCUSATE SODIUM 8.6-50 MG PO TABS
2.0000 | ORAL_TABLET | Freq: Every day | ORAL | 0 refills | Status: DC
  Filled 2022-06-10: qty 30, 15d supply, fill #0

## 2022-06-10 MED ORDER — OXYCODONE HCL 5 MG PO TABS
5.0000 mg | ORAL_TABLET | ORAL | 0 refills | Status: DC | PRN
  Filled 2022-06-10: qty 15, 3d supply, fill #0

## 2022-06-10 MED ORDER — IOHEXOL 350 MG/ML SOLN
75.0000 mL | Freq: Once | INTRAVENOUS | Status: AC | PRN
  Administered 2022-06-10: 75 mL via INTRAVENOUS

## 2022-06-10 NOTE — Progress Notes (Signed)
Given pt h/o Roux en Y and acute abd pain, will perform CT A/p with contrast to eval for hernia. D/c pending CT.  Myrtie Hawk, DO FMOB Fellow, Faculty practice [facility], [facility] for [facility] 06/10/22  8:38 AM

## 2022-06-10 NOTE — Lactation Note (Signed)
This note was copied from a baby's chart.  NICU Lactation Consultation Note  Patient Name: Boy Aayra Hornbaker [id number] Date: 06/10/2022 Age:32 hours   Subjective Reason for consult: Follow-up assessment; Primapara; 1st time breastfeeding; NICU baby; Early term 66-38.6wks  Lactation followed up with [name] in her room. I assisted in the review of hand expression and pumping. I obtained breastmilk storage lables and transferred her EBM to NICU. Maeby brought in her own syringes to fill with hand expresed colostrum. I recommended that she should label her EBM to be used with feedings. Otherwise, it can be used for oral care or other uses.  Armiyah states that it's a priority to provide her EBM to infant. She also wants to breastfeed.   Johann will need to have a CT scan today; I recommended pumping q3 hours while separated from infant. I encouraged her to call for assistance with breastfeeding, when ready.  Objective Infant data: Mother's Current Feeding Choice: Breast Milk and Formula  Infant feeding assessment Scale for Readiness: 2 Scale for Quality: 3  Maternal data: G2P1011  C-Section, Low Transverse Current breast feeding challenges:: NICU; separation  Does the patient have breastfeeding experience prior to this delivery?: No  Pumping frequency: recommended q3 hours Pumped volume: 15 mL Flange Size: 24  Risk factor for low milk supply:: primipara, blood loss of 381 cc., infant separation  Pump: Personal  Assessment Infant: Feeding Status: Ad lib  Maternal: Milk volume: Normal  Intervention/Plan Interventions: Breast feeding basics reviewed; Hand express; Hand pump; DEBP; Education  Tools: Pump; Flanges Pump Education: Setup, frequency, and cleaning  Plan: Consult Status: NICU follow-up  NICU Follow-up type: Verify onset of copious milk; Verify absence of engorgement    Walker Shadow 06/10/2022, 11:53 AM

## 2022-06-10 NOTE — Progress Notes (Signed)
RN called to pt's room for pt with emesis and wanting anti-nausea meds. Pt also complaining of pain due to moving so much in CT. RN called MD for additional pain relief orders- see new order for one time additional pain medication. Will continue to monitor patient.

## 2022-06-10 NOTE — Progress Notes (Signed)
Spoke with patient about CT results demonstrating large stool burden in RLQ where her pain is localized. She is currently in little pain after medication administration. Discussed using miralax BID and senokot 2 tabs daily to help produce a bowel movement. Also discussed one-time dose of scopolamine to aid with nausea. Advised patient to call clinic if her symptoms persisted despite above interventions. She is ready for discharge.

## 2022-06-10 NOTE — Progress Notes (Signed)
Oral contrast solution delivered to patient and was instructed to drink one bottle now and one bottle starting at  1015. Pt voiced understanding and no questions at this time.

## 2022-06-10 NOTE — Progress Notes (Signed)
Pt alerted RN that she finished oral contrast at 1125 with no issues

## 2022-06-11 ENCOUNTER — Encounter: Payer: Medicaid Other | Admitting: Obstetrics and Gynecology

## 2022-06-13 ENCOUNTER — Ambulatory Visit: Payer: Medicaid Other

## 2022-06-16 ENCOUNTER — Telehealth (HOSPITAL_COMMUNITY): Payer: Self-pay | Admitting: *Deleted

## 2022-06-16 DIAGNOSIS — Z1331 Encounter for screening for depression: Secondary | ICD-10-CM
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
## 2022-06-18 ENCOUNTER — Encounter: Payer: Medicaid Other | Admitting: Student

## 2022-06-20 ENCOUNTER — Ambulatory Visit (INDEPENDENT_AMBULATORY_CARE_PROVIDER_SITE_OTHER): Payer: Medicaid Other | Admitting: General Practice

## 2022-06-20 VITALS — BP 123/78 | HR 89 | Ht 64.0 in | Wt 259.7 lb

## 2022-06-20 DIAGNOSIS — Z4889 Encounter for other specified surgical aftercare: Secondary | ICD-10-CM

## 2022-06-20 MED ORDER — ACETAMINOPHEN 500 MG PO TABS: 1000.0000 mg | ORAL_TABLET | Freq: Four times a day (QID) | ORAL | 0 refills | Status: DC

## 2022-06-20 MED ORDER — GABAPENTIN 100 MG PO CAPS: 200.0000 mg | ORAL_CAPSULE | Freq: Three times a day (TID) | ORAL | 0 refills | Status: DC

## 2022-06-20 NOTE — Progress Notes (Signed)
Pt is 13 days postpartum and has concerns about odor from the incision site. The wound is cleansed, debrided of foreign material as much as possible, dry and intact. No visible signs of infection. Mild odor noticed but it did not indicate infection. The patient is alerted to watch for any signs of infection (redness, pus, pain, increased swelling or fever) and call if such occurs. Home wound care instructions are provided. Tetanus vaccination status reviewed: last tetanus booster within 10 years.   Pt requesting refill of Tylenol and gabapentin.

## 2022-06-25 ENCOUNTER — Encounter: Payer: Medicaid Other | Admitting: Student

## 2022-07-02 ENCOUNTER — Encounter: Payer: Medicaid Other | Admitting: Student

## 2022-07-15 ENCOUNTER — Telehealth: Payer: Self-pay | Admitting: Emergency Medicine

## 2022-07-15 NOTE — Telephone Encounter (Signed)
TC from patient who reports heavy vaginal bleeding at 6 weeks pp. Discussed menstrual cycle changes during post partum.  Cannot take Ibuprofen d/t gastric bypass hx.  Pt has PP apt on 1/5.

## 2022-07-18 ENCOUNTER — Ambulatory Visit: Payer: Medicaid Other | Admitting: Obstetrics

## 2022-07-18 ENCOUNTER — Ambulatory Visit (INDEPENDENT_AMBULATORY_CARE_PROVIDER_SITE_OTHER): Payer: Medicaid Other | Admitting: Obstetrics and Gynecology

## 2022-07-18 DIAGNOSIS — Z98891 History of uterine scar from previous surgery: Secondary | ICD-10-CM

## 2022-07-18 DIAGNOSIS — Z6841 Body Mass Index (BMI) 40.0 and over, adult: Secondary | ICD-10-CM | POA: Diagnosis not present

## 2022-07-18 NOTE — Progress Notes (Signed)
[name] Partum Visit Note  Emily Frank is a 32 y.o. G50P1011 female who presents for a postpartum visit. She is 6 weeks postpartum following a primary cesarean section.  I have fully reviewed the prenatal and intrapartum course. The delivery was at 39 gestational weeks.  Anesthesia: spinal. Postpartum course has been uncomplicated. Baby is doing well. Baby is feeding by both breast and bottle - Similac Alimentum. Bleeding no bleeding. Bowel function is normal. Bladder function is normal. Patient is not sexually active. Contraception method is abstinence.   Postpartum depression screening: negative.   The pregnancy intention screening data noted above was reviewed. Potential methods of contraception were discussed. The patient elected to proceed with tubal ligation   Edinburgh Postnatal Depression Scale - 07/18/22 0827       Edinburgh Postnatal Depression Scale:  In the Past 7 Days   I have been able to laugh and see the funny side of things. 0    I have looked forward with enjoyment to things. 0    I have blamed myself unnecessarily when things went wrong. 0    I have been anxious or worried for no good reason. 0    I have felt scared or panicky for no good reason. 0    Things have been getting on top of me. 0    I have been so unhappy that I have had difficulty sleeping. 0    I have felt sad or miserable. 0    I have been so unhappy that I have been crying. 0    The thought of harming myself has occurred to me. 0    Edinburgh Postnatal Depression Scale Total 0             Health Maintenance Due  Topic Date Due   COVID-19 Vaccine (1) Never done    The following portions of the patient's history were reviewed and updated as appropriate: allergies, current medications, past family history, past medical history, past social history, past surgical history, and problem list.  Review of Systems Pertinent items are noted in HPI.  Objective:  BP 111/78   Pulse 73    Wt 264 lb (119.7 kg)   LMP 07/12/2022   Breastfeeding Yes   BMI 45.32 kg/m    General:  alert, cooperative, appears stated age, no distress, and moderately obese   Breasts:  not indicated  Lungs: clear to auscultation bilaterally  Heart:  regular rate and rhythm  Abdomen: soft, non-tender; bowel sounds normal; no masses,  no organomegaly   Wound well approximated incision, clean and dry, one small defect on left margin of incision , visually a pore or healing tract  GU exam:  not indicated       Assessment:    Encounter for postpartum normal postpartum exam.   Plan:   Essential components of care per ACOG recommendations:  1.  Mood and well being: Patient with negative depression screening today. Reviewed local resources for support.  - Patient tobacco use? No.   - hx of drug use? No.    2. Infant care and feeding:  -Patient currently breastmilk feeding? Yes. Reviewed importance of draining breast regularly to support lactation.  -Social determinants of health (SDOH) reviewed in EPIC. No concerns.  3. Sexuality, contraception and birth spacing - Patient does not want a pregnancy in the next year.  Desired family size is 1 child.  - Reviewed reproductive life planning. Reviewed contraceptive methods based on pt preferences and effectiveness.  Patient desired Female Sterilization today.  Pt declined any other form of contraception to bridge until BTL completed. - Discussed birth spacing of 18 months  4. Sleep and fatigue -Encouraged family/partner/community support of 4 hrs of uninterrupted sleep to help with mood and fatigue  5. Physical Recovery  - Discussed patients delivery and complications. She describes her labor as mixed. - Patient had a C-section emergent.  Patient expressed understanding - Patient has urinary incontinence? No. - Patient is safe to resume physical and sexual activity  6.  Health Maintenance - HM due items addressed Yes - Last pap smear   Diagnosis  Date Value Ref Range Status  03/19/2022 (A)  Final   - Atypical squamous cells of undetermined significance (ASC-US)   Pap smear not done at today's visit.  -Breast Cancer screening indicated? No.   7. Chronic Disease/Pregnancy Condition follow up: None  - PCP follow up Will schedule laparoscopic bilateral salpingectomy in 3 months to allow for further healing from cesarean section [facility], [place] for [place], [place]

## 2022-09-24 ENCOUNTER — Encounter: Payer: Medicaid Other | Admitting: Nurse Practitioner

## 2022-10-16 ENCOUNTER — Encounter (HOSPITAL_COMMUNITY): Payer: Self-pay | Admitting: *Deleted

## 2022-10-27 ENCOUNTER — Encounter: Payer: Self-pay | Admitting: Obstetrics and Gynecology

## 2022-10-27 ENCOUNTER — Ambulatory Visit (INDEPENDENT_AMBULATORY_CARE_PROVIDER_SITE_OTHER): Payer: Medicaid Other | Admitting: Obstetrics and Gynecology

## 2022-10-27 VITALS — BP 113/72 | HR 66 | Wt 270.0 lb

## 2022-10-27 DIAGNOSIS — Z01818 Encounter for other preprocedural examination: Secondary | ICD-10-CM

## 2022-10-27 DIAGNOSIS — Z3009 Encounter for other general counseling and advice on contraception: Secondary | ICD-10-CM
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
## 2022-10-27 NOTE — Progress Notes (Signed)
Pt is in office for Tubal pre op.  Pt has no questions today.  Pt states abstinence for [facility].
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
## 2022-11-05 NOTE — Progress Notes (Signed)
Surgical Instructions    Your procedure is scheduled on November 11, 2022.  Report to [facility] Main Entrance "A" at 11:00 A.M., then check in with the Admitting office.  Call this number if you have problems the morning of surgery:  [phone]   If you have any questions prior to your surgery date call [phone]: Open Monday-Friday 8am-4pm If you experience any cold or flu symptoms such as cough, fever, chills, shortness of breath, etc. between now and your scheduled surgery, please notify us at the above number     Remember:  Do not eat after midnight the night before your surgery  You may drink clear liquids until 10:00AM the morning of your surgery.   Clear liquids allowed are: Water, Non-Citrus Juices (without pulp), Carbonated Beverages, Clear Tea, Black Coffee ONLY (NO MILK, CREAM OR POWDERED CREAMER of any kind), and Gatorade    Take these medicines the morning of surgery with A SIP OF WATER: NONE    As of today, STOP taking any Aspirin (unless otherwise instructed by your surgeon) Aleve, Naproxen, Ibuprofen, Motrin, Advil, Goody's, BC's, all herbal medications, fish oil, and all vitamins.            [facility] is not responsible for any belongings or valuables.    Do NOT Smoke (Tobacco/Vaping)  24 hours prior to your procedure  If you use a CPAP at night, you may bring your mask for your overnight stay.   Contacts, glasses, hearing aids, dentures or partials may not be worn into surgery, please bring cases for these belongings   For patients admitted to the hospital, discharge time will be determined by your treatment team.   Patients discharged the day of surgery will not be allowed to drive home, and someone needs to stay with them for 24 hours.   SURGICAL WAITING ROOM VISITATION Patients having surgery or a procedure may have no more than 2 support people in the waiting area - these visitors may rotate.   Children under the age of 38 must have an adult with  them who is not the patient. If the patient needs to stay at the hospital during part of their recovery, the visitor guidelines for inpatient rooms apply. Pre-op nurse will coordinate an appropriate time for 1 support person to accompany patient in pre-op.  This support person may not rotate.   Please refer to [email or web address] for the visitor guidelines for Inpatients (after your surgery is over and you are in a regular room).    Special instructions:    Oral Hygiene is also important to reduce your risk of infection.  Remember - BRUSH YOUR TEETH THE MORNING OF SURGERY WITH YOUR REGULAR TOOTHPASTE   [place]- Preparing For Surgery  Before surgery, you can play an important role. Because skin is not sterile, your skin needs to be as free of germs as possible. You can reduce the number of germs on your skin by washing with CHG (chlorahexidine gluconate) Soap before surgery.  CHG is an antiseptic cleaner which kills germs and bonds with the skin to continue killing germs even after washing.     Please do not use if you have an allergy to CHG or antibacterial soaps. If your skin becomes reddened/irritated stop using the CHG.  Do not shave (including legs and underarms) for at least 48 hours prior to first CHG shower. It is OK to shave your face.  Please follow these instructions carefully.     Shower the [employer]  SURGERY and the MORNING OF SURGERY with CHG Soap.   If you chose to wash your hair, wash your hair first as usual with your normal shampoo. After you shampoo, rinse your hair and body thoroughly to remove the shampoo.  Then [employer] and genitals (private parts) with your normal soap and rinse thoroughly to remove soap.  After that Use CHG Soap as you would any other liquid soap. You can apply CHG directly to the skin and wash gently with a scrungie or a clean washcloth.   Apply the CHG Soap to your body ONLY FROM THE NECK  DOWN.  Do not use on open wounds or open sores. Avoid contact with your eyes, ears, mouth and genitals (private parts). Wash Face and genitals (private parts)  with your normal soap.   Wash thoroughly, paying special attention to the area where your surgery will be performed.  Thoroughly rinse your body with warm water from the neck down.  DO NOT shower/wash with your normal soap after using and rinsing off the CHG Soap.  Pat yourself dry with a CLEAN TOWEL.  Wear CLEAN PAJAMAS to bed the night before surgery  Place CLEAN SHEETS on your bed the night before your surgery  DO NOT SLEEP WITH PETS.   Day of Surgery:  Take a shower with CHG soap. Wear Clean/Comfortable clothing the morning of surgery Do not wear jewelry or makeup. Do not wear lotions, powders, perfumes/cologne or deodorant. Do not shave 48 hours prior to surgery.  Men may shave face and neck. Do not bring valuables to the hospital. Do not wear nail polish, gel polish, artificial nails, or any other type of covering on natural nails (fingers and toes) If you have artificial nails or gel coating that need to be removed by a nail salon, please have this removed prior to surgery. Artificial nails or gel coating may interfere with anesthesia's ability to adequately monitor your vital signs. Remember to brush your teeth WITH YOUR REGULAR TOOTHPASTE.    If you received a COVID test during your pre-op visit, it is requested that you wear a mask when out in public, stay away from anyone that may not be feeling well, and notify your surgeon if you develop symptoms. If you have been in contact with anyone that has tested positive in the last 10 days, please notify your surgeon.    Please read over the following fact sheets that you were given.

## 2022-11-06 ENCOUNTER — Encounter (HOSPITAL_COMMUNITY)
Admission: RE | Admit: 2022-11-06 | Discharge: 2022-11-06 | Disposition: A | Discharge status: Home / Self Care | Payer: Medicaid Other | Source: Ambulatory Visit | Attending: Obstetrics and Gynecology | Admitting: Obstetrics and Gynecology

## 2022-11-06 ENCOUNTER — Encounter (HOSPITAL_COMMUNITY): Payer: Self-pay | Admitting: *Deleted

## 2022-11-06 ENCOUNTER — Other Ambulatory Visit: Payer: Self-pay

## 2022-11-06 DIAGNOSIS — Z01812 Encounter for preprocedural laboratory examination: Secondary | ICD-10-CM | POA: Diagnosis not present

## 2022-11-06 DIAGNOSIS — Z01818 Encounter for other preprocedural examination: Secondary | ICD-10-CM

## 2022-11-06 HISTORY — DX: Nausea with vomiting, unspecified: R11.2

## 2022-11-06 HISTORY — DX: Anemia, unspecified: D64.9

## 2022-11-06 HISTORY — DX: Other specified postprocedural states: Z98.890

## 2022-11-06 LAB — CBC
HCT: 38.9 % (ref 36.0–46.0)
Hemoglobin: 12.4 g/dL (ref 12.0–15.0)
MCH: 27.6 pg (ref 26.0–34.0)
MCHC: 31.9 g/dL (ref 30.0–36.0)
MCV: 86.4 fL (ref 80.0–100.0)
Platelets: 269 10*3/uL (ref 150–400)
RBC: 4.5 MIL/uL (ref 3.87–5.11)
RDW: 14.7 % (ref 11.5–15.5)
WBC: 7.7 10*3/uL (ref 4.0–10.5)
nRBC: 0 % (ref 0.0–0.2)

## 2022-11-06 LAB — TYPE AND SCREEN
ABO/RH(D): O POS
Antibody Screen: NEGATIVE

## 2022-11-06 NOTE — Progress Notes (Signed)
PCP - Bertram Denver, NP Cardiologist - denies  PPM/ICD - denies   Chest x-ray - 03/14/19 EKG - 06/03/19 Stress Test - denies ECHO - denies Cardiac Cath - denies  Sleep Study - denies   DM- denies  ASA/Blood Thinner Instructions: n/a   ERAS Protcol - yes, no drink   COVID TEST- n/a   Anesthesia review: no  Patient denies shortness of breath, fever, cough and chest pain at PAT appointment   All instructions explained to the patient, with a verbal understanding of the material. Patient agrees to go over the instructions while at home for a better understanding. The opportunity to ask questions was provided.

## 2022-11-11 ENCOUNTER — Ambulatory Visit (HOSPITAL_COMMUNITY)
Admission: RE | Admit: 2022-11-11 | Discharge: 2022-11-11 | Disposition: A | Discharge status: Home / Self Care | Payer: Medicaid Other | Attending: Obstetrics and Gynecology | Admitting: Obstetrics and Gynecology

## 2022-11-11 ENCOUNTER — Ambulatory Visit (HOSPITAL_COMMUNITY): Payer: Medicaid Other | Admitting: Certified Registered Nurse Anesthetist

## 2022-11-11 ENCOUNTER — Encounter (HOSPITAL_COMMUNITY)
Admission: RE | Disposition: A | Discharge status: Home / Self Care | Payer: Self-pay | Source: Home / Self Care | Attending: Obstetrics and Gynecology

## 2022-11-11 ENCOUNTER — Encounter (HOSPITAL_COMMUNITY): Payer: Self-pay | Admitting: Obstetrics and Gynecology

## 2022-11-11 ENCOUNTER — Ambulatory Visit (HOSPITAL_BASED_OUTPATIENT_CLINIC_OR_DEPARTMENT_OTHER): Payer: Medicaid Other | Admitting: Certified Registered Nurse Anesthetist

## 2022-11-11 ENCOUNTER — Other Ambulatory Visit: Payer: Self-pay

## 2022-11-11 DIAGNOSIS — Z302 Encounter for sterilization: Secondary | ICD-10-CM

## 2022-11-11 DIAGNOSIS — F418 Other specified anxiety disorders: Secondary | ICD-10-CM

## 2022-11-11 DIAGNOSIS — Z9884 Bariatric surgery status: Secondary | ICD-10-CM | POA: Insufficient documentation

## 2022-11-11 DIAGNOSIS — T888XXA Other specified complications of surgical and medical care, not elsewhere classified, initial encounter: Secondary | ICD-10-CM | POA: Diagnosis not present

## 2022-11-11 DIAGNOSIS — Z6841 Body Mass Index (BMI) 40.0 and over, adult: Secondary | ICD-10-CM | POA: Diagnosis not present

## 2022-11-11 DIAGNOSIS — Z01818 Encounter for other preprocedural examination: Secondary | ICD-10-CM

## 2022-11-11 HISTORY — PX: LAPAROSCOPIC BILATERAL SALPINGECTOMY: SHX5889

## 2022-11-11 LAB — POCT PREGNANCY, URINE: Preg Test, Ur: NEGATIVE

## 2022-11-11 SURGERY — SALPINGECTOMY, BILATERAL, LAPAROSCOPIC
Anesthesia: General | Site: Abdomen | Laterality: Bilateral

## 2022-11-11 MED ORDER — CEFAZOLIN IN SODIUM CHLORIDE 3-0.9 GM/100ML-% IV SOLN
INTRAVENOUS | Status: AC
  Filled 2022-11-11: qty 100

## 2022-11-11 MED ORDER — ONDANSETRON HCL 4 MG/2ML IJ SOLN
INTRAMUSCULAR | Status: AC
  Filled 2022-11-11: qty 2

## 2022-11-11 MED ORDER — ROCURONIUM BROMIDE 10 MG/ML (PF) SYRINGE
PREFILLED_SYRINGE | INTRAVENOUS | Status: AC
  Filled 2022-11-11: qty 10

## 2022-11-11 MED ORDER — SUGAMMADEX SODIUM 200 MG/2ML IV SOLN
INTRAVENOUS | Status: DC | PRN
  Administered 2022-11-11: 245 mg via INTRAVENOUS

## 2022-11-11 MED ORDER — SOD CITRATE-CITRIC ACID 500-334 MG/5ML PO SOLN: 30.0000 mL | ORAL | Status: AC

## 2022-11-11 MED ORDER — POVIDONE-IODINE 10 % EX SWAB
2.0000 | Freq: Once | CUTANEOUS | Status: AC
  Administered 2022-11-11: 2 via TOPICAL

## 2022-11-11 MED ORDER — OXYCODONE HCL 5 MG/5ML PO SOLN: 5.0000 mg | Freq: Once | ORAL | Status: AC | PRN

## 2022-11-11 MED ORDER — CHLORHEXIDINE GLUCONATE 0.12 % MT SOLN: 15.0000 mL | Freq: Once | OROMUCOSAL | Status: AC

## 2022-11-11 MED ORDER — KETOROLAC TROMETHAMINE 30 MG/ML IJ SOLN
INTRAMUSCULAR | Status: AC
  Filled 2022-11-11: qty 1

## 2022-11-11 MED ORDER — DEXAMETHASONE SODIUM PHOSPHATE 10 MG/ML IJ SOLN
INTRAMUSCULAR | Status: DC | PRN
  Administered 2022-11-11: 10 mg via INTRAVENOUS

## 2022-11-11 MED ORDER — MIDAZOLAM HCL 2 MG/2ML IJ SOLN
INTRAMUSCULAR | Status: AC
  Filled 2022-11-11: qty 2

## 2022-11-11 MED ORDER — PROPOFOL 10 MG/ML IV BOLUS
INTRAVENOUS | Status: AC
  Filled 2022-11-11: qty 20

## 2022-11-11 MED ORDER — SOD CITRATE-CITRIC ACID 500-334 MG/5ML PO SOLN
ORAL | Status: AC
  Administered 2022-11-11: 30 mL via ORAL
  Filled 2022-11-11: qty 30

## 2022-11-11 MED ORDER — ONDANSETRON HCL 4 MG/2ML IJ SOLN
INTRAMUSCULAR | Status: DC | PRN
  Administered 2022-11-11: 4 mg via INTRAVENOUS

## 2022-11-11 MED ORDER — ORAL CARE MOUTH RINSE: 15.0000 mL | Freq: Once | OROMUCOSAL | Status: AC

## 2022-11-11 MED ORDER — MIDAZOLAM HCL 5 MG/5ML IJ SOLN
INTRAMUSCULAR | Status: DC | PRN
  Administered 2022-11-11: 2 mg via INTRAVENOUS

## 2022-11-11 MED ORDER — BUPIVACAINE HCL (PF) 0.25 % IJ SOLN
INTRAMUSCULAR | Status: AC
  Filled 2022-11-11: qty 90

## 2022-11-11 MED ORDER — SCOPOLAMINE 1 MG/3DAYS TD PT72
MEDICATED_PATCH | TRANSDERMAL | Status: AC
  Filled 2022-11-11: qty 1

## 2022-11-11 MED ORDER — SUCCINYLCHOLINE CHLORIDE 200 MG/10ML IV SOSY
PREFILLED_SYRINGE | INTRAVENOUS | Status: AC
  Filled 2022-11-11: qty 10

## 2022-11-11 MED ORDER — KETOROLAC TROMETHAMINE 30 MG/ML IJ SOLN
INTRAMUSCULAR | Status: DC | PRN
  Administered 2022-11-11: 30 mg via INTRAVENOUS

## 2022-11-11 MED ORDER — FENTANYL CITRATE (PF) 250 MCG/5ML IJ SOLN
INTRAMUSCULAR | Status: AC
  Filled 2022-11-11: qty 5

## 2022-11-11 MED ORDER — ACETAMINOPHEN 500 MG PO TABS: 1000.0000 mg | ORAL_TABLET | ORAL | Status: AC

## 2022-11-11 MED ORDER — DEXAMETHASONE SODIUM PHOSPHATE 10 MG/ML IJ SOLN
INTRAMUSCULAR | Status: AC
  Filled 2022-11-11: qty 1

## 2022-11-11 MED ORDER — BUPIVACAINE HCL 0.25 % IJ SOLN
INTRAMUSCULAR | Status: DC | PRN
  Administered 2022-11-11: 19 mL

## 2022-11-11 MED ORDER — LACTATED RINGERS IV SOLN: INTRAVENOUS | Status: DC

## 2022-11-11 MED ORDER — CEFAZOLIN IN SODIUM CHLORIDE 3-0.9 GM/100ML-% IV SOLN
3.0000 g | INTRAVENOUS | Status: AC
  Administered 2022-11-11: 3 g via INTRAVENOUS

## 2022-11-11 MED ORDER — ROCURONIUM BROMIDE 10 MG/ML (PF) SYRINGE
PREFILLED_SYRINGE | INTRAVENOUS | Status: DC | PRN
  Administered 2022-11-11: 20 mg via INTRAVENOUS
  Administered 2022-11-11: 40 mg via INTRAVENOUS

## 2022-11-11 MED ORDER — SCOPOLAMINE 1 MG/3DAYS TD PT72
1.0000 | MEDICATED_PATCH | Freq: Once | TRANSDERMAL | Status: DC
  Administered 2022-11-11: 1.5 mg via TRANSDERMAL

## 2022-11-11 MED ORDER — OXYCODONE HCL 5 MG PO TABS
5.0000 mg | ORAL_TABLET | Freq: Once | ORAL | Status: AC | PRN
  Administered 2022-11-11: 5 mg via ORAL

## 2022-11-11 MED ORDER — LIDOCAINE 2% (20 MG/ML) 5 ML SYRINGE
INTRAMUSCULAR | Status: DC | PRN
  Administered 2022-11-11: 100 mg via INTRAVENOUS

## 2022-11-11 MED ORDER — LIDOCAINE 2% (20 MG/ML) 5 ML SYRINGE
INTRAMUSCULAR | Status: AC
  Filled 2022-11-11: qty 5

## 2022-11-11 MED ORDER — CHLORHEXIDINE GLUCONATE 0.12 % MT SOLN
OROMUCOSAL | Status: AC
  Administered 2022-11-11: 15 mL via OROMUCOSAL
  Filled 2022-11-11: qty 15

## 2022-11-11 MED ORDER — FENTANYL CITRATE (PF) 100 MCG/2ML IJ SOLN
25.0000 ug | INTRAMUSCULAR | Status: DC | PRN
  Administered 2022-11-11: 50 ug via INTRAVENOUS

## 2022-11-11 MED ORDER — KETOROLAC TROMETHAMINE 30 MG/ML IJ SOLN: 30.0000 mg | Freq: Once | INTRAMUSCULAR | Status: DC | PRN

## 2022-11-11 MED ORDER — 0.9 % SODIUM CHLORIDE (POUR BTL) OPTIME
TOPICAL | Status: DC | PRN
  Administered 2022-11-11: 1000 mL

## 2022-11-11 MED ORDER — FENTANYL CITRATE (PF) 100 MCG/2ML IJ SOLN
INTRAMUSCULAR | Status: AC
  Filled 2022-11-11: qty 2

## 2022-11-11 MED ORDER — SUCCINYLCHOLINE CHLORIDE 200 MG/10ML IV SOSY
PREFILLED_SYRINGE | INTRAVENOUS | Status: DC | PRN
  Administered 2022-11-11: 160 mg via INTRAVENOUS

## 2022-11-11 MED ORDER — ONDANSETRON HCL 4 MG/2ML IJ SOLN: 4.0000 mg | Freq: Once | INTRAMUSCULAR | Status: DC | PRN

## 2022-11-11 MED ORDER — OXYCODONE-ACETAMINOPHEN 5-325 MG PO TABS: 1.0000 | ORAL_TABLET | Freq: Four times a day (QID) | ORAL | 0 refills | Status: DC | PRN

## 2022-11-11 MED ORDER — OXYCODONE HCL 5 MG PO TABS
ORAL_TABLET | ORAL | Status: AC
  Filled 2022-11-11: qty 1

## 2022-11-11 MED ORDER — PROPOFOL 10 MG/ML IV BOLUS
INTRAVENOUS | Status: DC | PRN
  Administered 2022-11-11: 200 mg via INTRAVENOUS

## 2022-11-11 MED ORDER — FENTANYL CITRATE (PF) 250 MCG/5ML IJ SOLN
INTRAMUSCULAR | Status: DC | PRN
  Administered 2022-11-11: 100 ug via INTRAVENOUS
  Administered 2022-11-11: 50 ug via INTRAVENOUS

## 2022-11-11 MED ORDER — ACETAMINOPHEN 500 MG PO TABS
ORAL_TABLET | ORAL | Status: AC
  Administered 2022-11-11: 1000 mg via ORAL
  Filled 2022-11-11: qty 2

## 2022-11-11 MED ORDER — AMISULPRIDE (ANTIEMETIC) 5 MG/2ML IV SOLN
INTRAVENOUS | Status: AC
  Filled 2022-11-11: qty 4

## 2022-11-11 MED ORDER — AMISULPRIDE (ANTIEMETIC) 5 MG/2ML IV SOLN
10.0000 mg | Freq: Once | INTRAVENOUS | Status: AC
  Administered 2022-11-11: 10 mg via INTRAVENOUS

## 2022-11-11 SURGICAL SUPPLY — 40 items
ADH SKN CLS APL DERMABOND .7 (GAUZE/BANDAGES/DRESSINGS) ×1
APL SRG 38 LTWT LNG FL B (MISCELLANEOUS)
APL SWBSTK 6 STRL LF DISP (MISCELLANEOUS) ×1
APPLICATOR ARISTA FLEXITIP XL (MISCELLANEOUS) IMPLANT
APPLICATOR COTTON TIP 6 STRL (MISCELLANEOUS) IMPLANT
APPLICATOR COTTON TIP 6IN STRL (MISCELLANEOUS) ×1
DERMABOND ADVANCED .7 DNX12 (GAUZE/BANDAGES/DRESSINGS) ×1 IMPLANT
DRSG COVADERM PLUS 2X2 (GAUZE/BANDAGES/DRESSINGS) IMPLANT
DRSG OPSITE POSTOP 3X4 (GAUZE/BANDAGES/DRESSINGS) ×1 IMPLANT
DURAPREP 26ML APPLICATOR (WOUND CARE) ×1 IMPLANT
GLOVE BIOGEL PI IND STRL 8 (GLOVE) ×1 IMPLANT
GLOVE SURG ORTHO 8.0 STRL STRW (GLOVE) ×1 IMPLANT
GOWN STRL REUS W/ TWL LRG LVL3 (GOWN DISPOSABLE) ×1 IMPLANT
GOWN STRL REUS W/ TWL XL LVL3 (GOWN DISPOSABLE) ×1 IMPLANT
GOWN STRL REUS W/TWL LRG LVL3 (GOWN DISPOSABLE) ×1
GOWN STRL REUS W/TWL XL LVL3 (GOWN DISPOSABLE) ×1
HEMOSTAT ARISTA ABSORB 3G PWDR (HEMOSTASIS) IMPLANT
IRRIG SUCT STRYKERFLOW 2 WTIP (MISCELLANEOUS)
IRRIGATION SUCT STRKRFLW 2 WTP (MISCELLANEOUS) IMPLANT
KIT PINK PAD W/HEAD ARE REST (MISCELLANEOUS) ×2
KIT PINK PAD W/HEAD ARM REST (MISCELLANEOUS) ×2 IMPLANT
KIT TURNOVER KIT B (KITS) ×1 IMPLANT
LIGASURE VESSEL 5MM BLUNT TIP (ELECTROSURGICAL) ×1 IMPLANT
NEEDLE INSUFFLATION 150MM (ENDOMECHANICALS) IMPLANT
NS IRRIG 1000ML POUR BTL (IV SOLUTION) ×1 IMPLANT
PACK LAPAROSCOPY BASIN (CUSTOM PROCEDURE TRAY) ×1 IMPLANT
PROTECTOR NERVE ULNAR (MISCELLANEOUS) ×2 IMPLANT
SET TUBE SMOKE EVAC HIGH FLOW (TUBING) ×1 IMPLANT
SLEEVE XCEL OPT CAN 5 100 (ENDOMECHANICALS) ×1 IMPLANT
SUT MNCRL AB 4-0 PS2 18 (SUTURE) ×1 IMPLANT
SUT VICRYL 0 UR6 27IN ABS (SUTURE) ×1 IMPLANT
SYS BAG RETRIEVAL 10MM (BASKET)
SYSTEM BAG RETRIEVAL 10MM (BASKET) IMPLANT
TOWEL GREEN STERILE FF (TOWEL DISPOSABLE) ×2 IMPLANT
TRAY FOLEY W/BAG SLVR 14FR (SET/KITS/TRAYS/PACK) ×1 IMPLANT
TROCAR 11X100 Z THREAD (TROCAR) ×1 IMPLANT
TROCAR KII 8X100ML NONTHREADED (TROCAR) IMPLANT
TROCAR XCEL NON-BLD 5MMX100MML (ENDOMECHANICALS) ×1 IMPLANT
TROCAR Z THREAD OPTICAL 5X150 (TROCAR) IMPLANT
WARMER LAPAROSCOPE (MISCELLANEOUS) ×1 IMPLANT

## 2022-11-11 NOTE — Anesthesia Preprocedure Evaluation (Signed)
Anesthesia Evaluation  Patient identified by MRN, date of birth, ID band Patient awake    Reviewed: Allergy & Precautions, H&P , NPO status , Patient's Chart, lab work & pertinent test results  History of Anesthesia Complications (+) PONV and history of anesthetic complications  Airway Mallampati: II  TM Distance: >3 FB Neck ROM: Full    Dental no notable dental hx.    Pulmonary neg pulmonary ROS   Pulmonary exam normal breath sounds clear to auscultation       Cardiovascular negative cardio ROS Normal cardiovascular exam Rhythm:Regular Rate:Normal     Neuro/Psych   Anxiety Depression    negative neurological ROS     GI/Hepatic negative GI ROS, Neg liver ROS,,,  Endo/Other    Morbid obesityPCOS  Renal/GU negative Renal ROS  negative genitourinary   Musculoskeletal negative musculoskeletal ROS (+)    Abdominal   Peds negative pediatric ROS (+)  Hematology negative hematology ROS (+)   Anesthesia Other Findings   Reproductive/Obstetrics negative OB ROS                             Anesthesia Physical Anesthesia Plan  ASA: 2  Anesthesia Plan: General   Post-op Pain Management: Minimal or no pain anticipated   Induction: Intravenous  PONV Risk Score and Plan: 4 or greater and Ondansetron, Dexamethasone, Treatment may vary due to age or medical condition and Midazolam  Airway Management Planned: Oral ETT  Additional Equipment:   Intra-op Plan:   Post-operative Plan: Extubation in OR  Informed Consent: I have reviewed the patients History and Physical, chart, labs and discussed the procedure including the risks, benefits and alternatives for the proposed anesthesia with the patient or authorized representative who has indicated his/her understanding and acceptance.     Dental advisory given  Plan Discussed with: CRNA and Surgeon  Anesthesia Plan Comments:         Anesthesia Quick Evaluation

## 2022-11-11 NOTE — Interval H&P Note (Signed)
History and Physical Interval Note:  11/11/2022 12:51 PM  Emily Frank  has presented today for surgery, with the diagnosis of Undesired Fertility.  The various methods of treatment have been discussed with the patient and family. After consideration of risks, benefits and other options for treatment, the patient has consented to  Procedure(s): LAPAROSCOPIC BILATERAL SALPINGECTOMY (Bilateral) as a surgical intervention.  The patient's history has been reviewed, patient examined, no change in status, stable for surgery.  I have reviewed the patient's chart and labs.  Questions were answered to the patient's satisfaction.     Warden Fillers

## 2022-11-11 NOTE — Op Note (Signed)
Emily Frank PROCEDURE DATE: 11/11/2022   PREOPERATIVE DIAGNOSIS:  Undesired fertility  POSTOPERATIVE DIAGNOSIS:  Undesired fertility  PROCEDURE:  Laparoscopic Bilateral Salpingectomy   SURGEON:  Dr. Mariel Aloe  ASSISTANT:  Dr. Scheryl Darter. Dr. Marline Backbone (entry into abdomen)   An experienced assistant was required given the standard of surgical care given the complexity of the case.  This assistant was needed for exposure, dissection, suctioning, retraction, instrument exchange, and for overall help during the procedure.  ANESTHESIA:  General endotracheal  COMPLICATIONS:  None immediate.  ESTIMATED BLOOD LOSS:  10 ml.  FLUIDS: 800 ml LR.  URINE OUTPUT:  600 ml of clear urine.  INDICATIONS: 32 y.o. G2P1011 with undesired fertility, desires permanent sterilization. Other reversible forms of contraception were discussed with patient; she declines all other modalities.  Risks of procedure discussed with patient including permanence of method, risk of regret, bleeding, infection, injury to surrounding organs and need for additional procedures including laparotomy.  Failure risk less than 0.5% with increased risk of ectopic gestation if pregnancy occurs was also discussed with patient.  Written informed consent was obtained.    FINDINGS:  Normal uterus, fallopian tubes, and ovaries.  TECHNIQUE:  The patient was taken to the operating room where general anesthesia was obtained without difficulty.  She was then placed in the dorsal lithotomy position and prepared and draped in sterile fashion.  After an adequate timeout was performed, a bivalved speculum was then placed in the patient's vagina, and the anterior lip of cervix grasped with the single-tooth tenaculum.  The Hulka uterine manipulator was then advanced into the uterus and secured.  The speculum was removed from the vagina as was the tenaculum.  Attention was then turned to the patient's abdomen where multiple  attempts of entry at the umbilicus were not successful.  Dr. Derrell Lolling from General surgery was called in to assistant entry due to the patient's previous bariatric surgery and residual obesity.  He was able to insufflate at the RUQ and then we placed a 6-mm skin incision was made in the umbilical fold.  The Optiview 5-mm trocar and sleeve were then advanced without difficulty with the laparoscope under direct visualization into the abdomen.   Adequate pneumoperitoneum was obtained.  A survey of the patient's pelvis and abdomen revealed the findings above. Bilateral 5-mm lower quadrant ports were then placed under direct visualization.  The fallopian tubes were transected from the uterine attachments and the underlying mesosalpinx with the LigaSure device allowing for bilateral salpingectomy.  The fallopian tubes were then removed from the abdomen under direct visualization.  The operative site was surveyed, and it was found to be hemostatic.   No intraoperative injury to other surrounding organs was noted.  The abdomen was desufflated and all instruments were then removed from the patient's abdomen.  All skin incisions were closed with 4-0 Vicryl and Dermabond.  The uterine manipulator was removed from the cervix without complications. The patient tolerated the procedure well.  Sponge, lap, and needle counts were correct times two.  The patient was then taken to the recovery room awake, extubated and in stable condition.  The patient will be discharged to home as per PACU criteria.  Routine postoperative instructions given.  She was prescribed Percocet.  She will follow up in the clinic in 2-3 weeks for postoperative evaluation.   Mariel Aloe, MD, FACOG Attending Obstetrician & Gynecologist Faculty Practice, [facility] [place]

## 2022-11-11 NOTE — Transfer of Care (Signed)
Immediate Anesthesia Transfer of Care Note  Patient: Emily Frank  Procedure(s) Performed: LAPAROSCOPIC BILATERAL SALPINGECTOMY (Bilateral: Abdomen)  Patient Location: PACU  Anesthesia Type:General  Level of Consciousness: awake, alert , and oriented  Airway & Oxygen Therapy: Patient Spontanous Breathing and Patient connected to face mask oxygen  Post-op Assessment: Report given to RN, Post -op Vital signs reviewed and stable, Patient moving all extremities X 4, and Patient able to stick tongue midline  Post vital signs: Reviewed  Last Vitals:  Vitals Value Taken Time  BP 136/92 11/11/22 1456  Temp 98.2   Pulse 81 11/11/22 1459  Resp 8 11/11/22 1459  SpO2 100 % 11/11/22 1459  Vitals shown include unvalidated device data.  Last Pain:  Vitals:   11/11/22 1117  TempSrc:   PainSc: 0-No pain         Complications: No notable events documented.

## 2022-11-11 NOTE — Op Note (Signed)
11/11/2022  3:29 PM  PATIENT:  Emily Frank  32 y.o. female  PRE-OPERATIVE DIAGNOSIS:  Undesired Fertility  POST-OPERATIVE DIAGNOSIS:  Undesired Fertility  PROCEDURE:  Procedure(s): LAPAROSCOPIC ACCESS for BILATERAL SALPINGECTOMY  SURGEON:  Surgeon(s) and Role:    * Warden Fillers, MD - Primary    * Adam Phenix, MD - Assisting    * Axel Filler, MD - Assisting  ANESTHESIA:   general  EBL:  10 mL   BLOOD ADMINISTERED:none  DRAINS: none   LOCAL MEDICATIONS USED:  NONE  SPECIMEN:  No Specimen  DISPOSITION OF SPECIMEN:  N/A  COUNTS:  YES  TOURNIQUET:  * No tourniquets in log *  DICTATION: .Dragon Dictation  Indications: Patient is a 32 year old female who was undergoing laparoscopic bilateral salpingectomy.  I was called intraoperatively for laparoscopic assess.  Patient does have a history of a Roux-en-Y gastric bypass.  There is difficulty in establishing pneumoperitoneum.  Details of procedure: Patient was prepped and draped prior to my arrival. A Veress needle technique was used to try to insufflate the abdomen Palmer's point.  I was unsuccessful.  I then proceeded to make an incision on the right upper quadrant subcostal margin.  This time was able to advance the Veress needle into the abdominal cavity.  Insufflation was begun.  Insufflation was eventually brought up to 15 mmHg.  A 5 Miller trocar was placed by Dr. Donavan Foil at the infraumbilical midline.  I was able to visualize the right upper quadrant Veress needle which appeared to be without any trauma to the bowel or liver.  The left upper quadrant site also appeared to be without injury.  I left them to continue with her procedure as needed.  Please see their op note for the rest of details of the operation.  PLAN OF CARE: Per primary team  PATIENT DISPOSITION:  PACU - hemodynamically stable.

## 2022-11-11 NOTE — Anesthesia Postprocedure Evaluation (Signed)
Anesthesia Post Note  Patient: Emily Frank  Procedure(s) Performed: LAPAROSCOPIC BILATERAL SALPINGECTOMY (Bilateral: Abdomen)     Patient location during evaluation: PACU Anesthesia Type: General Level of consciousness: awake and alert Pain management: pain level controlled Vital Signs Assessment: post-procedure vital signs reviewed and stable Respiratory status: spontaneous breathing, nonlabored ventilation, respiratory function stable and patient connected to nasal cannula oxygen Cardiovascular status: blood pressure returned to baseline and stable Postop Assessment: no apparent nausea or vomiting Anesthetic complications: no  No notable events documented.  Last Vitals:  Vitals:   11/11/22 1511 11/11/22 1530  BP: 117/86 113/74  Pulse: 76 71  Resp: 17 15  Temp:    SpO2: 100% 98%    Last Pain:  Vitals:   11/11/22 1519  TempSrc:   PainSc: 8                  Amberlee Garvey S

## 2022-11-11 NOTE — Anesthesia Procedure Notes (Signed)
Procedure Name: Intubation Date/Time: 11/11/2022 1:16 PM  Performed by: Cy Blamer, CRNAPre-anesthesia Checklist: Patient identified, Emergency Drugs available, Suction available and Patient being monitored Patient Re-evaluated:Patient Re-evaluated prior to induction Oxygen Delivery Method: Circle system utilized Preoxygenation: Pre-oxygenation with 100% oxygen Induction Type: IV induction and Rapid sequence Laryngoscope Size: Miller and 2 Grade View: Grade I Tube type: Oral Tube size: 7.0 mm Number of attempts: 1 Airway Equipment and Method: Stylet and Bite block Placement Confirmation: ETT inserted through vocal cords under direct vision, positive ETCO2 and breath sounds checked- equal and bilateral Secured at: 20 cm Tube secured with: Tape Dental Injury: Teeth and Oropharynx as per pre-operative assessment  Comments: Elective RSI

## 2022-11-12 ENCOUNTER — Encounter (HOSPITAL_COMMUNITY): Payer: Self-pay | Admitting: Obstetrics and Gynecology

## 2022-11-12 NOTE — Progress Notes (Signed)
Patient discharged 4/30, 50 mcgs of fentanyl wasted in Pyxis room-steri cycle with Hurshel Keys, RN. Was unable to waste in Pyxis computer due to patient being discharged from system.  Annamarie Yamaguchi M, RN   Confirmed by [facility] and Sela Hua, RN that 11 mcgs was given to patient leaving 50 mcgs to waste.

## 2022-11-13 LAB — SURGICAL PATHOLOGY

## 2022-12-09 ENCOUNTER — Encounter: Payer: Medicaid Other | Admitting: Obstetrics and Gynecology

## 2023-01-01 ENCOUNTER — Encounter: Payer: Medicaid Other | Admitting: Obstetrics and Gynecology

## 2023-02-18 ENCOUNTER — Ambulatory Visit: Payer: Medicaid Other | Admitting: Physician Assistant

## 2023-03-12 ENCOUNTER — Telehealth: Payer: Medicaid Other | Admitting: Physician Assistant

## 2023-03-12 DIAGNOSIS — B9789 Other viral agents as the cause of diseases classified elsewhere: Secondary | ICD-10-CM

## 2023-03-12 DIAGNOSIS — J019 Acute sinusitis, unspecified: Secondary | ICD-10-CM | POA: Diagnosis not present

## 2023-03-12 MED ORDER — FLUTICASONE PROPIONATE 50 MCG/ACT NA SUSP
2.0000 | Freq: Every day | NASAL | 0 refills | Status: DC
Start: 2023-03-12 — End: 2023-04-09

## 2023-03-12 NOTE — Progress Notes (Signed)
I have spent 5 minutes in review of e-visit questionnaire, review and updating patient chart, medical decision making and response to patient.   William Cody Martin, PA-C    

## 2023-03-12 NOTE — Progress Notes (Signed)
E-Visit for Sinus Problems ° °We are sorry that you are not feeling well.  Here is how we plan to help! ° °Based on what you have shared with me it looks like you have sinusitis.  Sinusitis is inflammation and infection in the sinus cavities of the head.  Based on your presentation I believe you most likely have Acute Viral Sinusitis.This is an infection most likely caused by a virus. There is not specific treatment for viral sinusitis other than to help you with the symptoms until the infection runs its course.  You may use an oral decongestant such as Mucinex D or if you have glaucoma or high blood pressure use plain Mucinex. Saline nasal spray help and can safely be used as often as needed for congestion, I have prescribed: Fluticasone nasal spray two sprays in each nostril once a day ° °Some authorities believe that zinc sprays or the use of Echinacea may shorten the course of your symptoms. ° °Sinus infections are not as easily transmitted as other respiratory infection, however we still recommend that you avoid close contact with loved ones, especially the very young and elderly.  Remember to wash your hands thoroughly throughout the day as this is the number one way to prevent the spread of infection! ° °Home Care: °Only take medications as instructed by your medical team. °Do not take these medications with alcohol. °A steam or ultrasonic humidifier can help congestion.  You can place a towel over your head and breathe in the steam from hot water coming from a faucet. °Avoid close contacts especially the very young and the elderly. °Cover your mouth when you cough or sneeze. °Always remember to wash your hands. ° °Get Help Right Away If: °You develop worsening fever or sinus pain. °You develop a severe head ache or visual changes. °Your symptoms persist after you have completed your treatment plan. ° °Make sure you °Understand these instructions. °Will watch your condition. °Will get help right away if you  are not doing well or get worse. ° ° °Thank you for choosing an e-visit. ° °Your e-visit answers were reviewed by a board certified advanced clinical practitioner to complete your personal care plan. Depending upon the condition, your plan could have included both over the counter or prescription medications. ° °Please review your pharmacy choice. Make sure the pharmacy is open so you can pick up prescription now. If there is a problem, you may contact your provider through MyChart messaging and have the prescription routed to another pharmacy.  Your safety is important to us. If you have drug allergies check your prescription carefully.  ° °For the next 24 hours you can use MyChart to ask questions about today's visit, request a non-urgent call back, or ask for a work or school excuse. °You will get an email in the next two days asking about your experience. I hope that your e-visit has been valuable and will speed your recovery. ° ° °

## 2023-04-03 ENCOUNTER — Ambulatory Visit: Payer: Medicaid Other | Attending: Nurse Practitioner | Admitting: Nurse Practitioner

## 2023-04-03 ENCOUNTER — Encounter: Payer: Self-pay | Admitting: Nurse Practitioner

## 2023-04-03 VITALS — BP 117/80 | HR 84 | Ht 64.0 in | Wt 288.0 lb

## 2023-04-03 DIAGNOSIS — N3941 Urge incontinence: Secondary | ICD-10-CM

## 2023-04-03 DIAGNOSIS — Z6841 Body Mass Index (BMI) 40.0 and over, adult: Secondary | ICD-10-CM

## 2023-04-03 DIAGNOSIS — R7303 Prediabetes: Secondary | ICD-10-CM

## 2023-04-03 DIAGNOSIS — D649 Anemia, unspecified: Secondary | ICD-10-CM

## 2023-04-03 DIAGNOSIS — E559 Vitamin D deficiency, unspecified: Secondary | ICD-10-CM

## 2023-04-03 DIAGNOSIS — N393 Stress incontinence (female) (male): Secondary | ICD-10-CM

## 2023-04-03 MED ORDER — PHENTERMINE HCL 37.5 MG PO TABS
37.5000 mg | ORAL_TABLET | Freq: Every day | ORAL | 1 refills | Status: DC
Start: 2023-04-03 — End: 2024-02-11

## 2023-04-03 MED ORDER — TIRZEPATIDE 2.5 MG/0.5ML ~~LOC~~ SOAJ
2.5000 mg | SUBCUTANEOUS | 6 refills | Status: DC
Start: 2023-04-03 — End: 2024-02-11

## 2023-04-03 NOTE — Progress Notes (Unsigned)
Patient wants to discuss weight loss options. Possibly [id number].
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
## 2023-04-04 LAB — CBC WITH DIFFERENTIAL/PLATELET
Basophils Absolute: 0 10*3/uL (ref 0.0–0.2)
Basos: 0 %
EOS (ABSOLUTE): 0.2 10*3/uL (ref 0.0–0.4)
Eos: 2 %
Hematocrit: 41.3 % (ref 34.0–46.6)
Hemoglobin: 12.6 g/dL (ref 11.1–15.9)
Immature Grans (Abs): 0 10*3/uL (ref 0.0–0.1)
Immature Granulocytes: 0 %
Lymphocytes Absolute: 2.3 10*3/uL (ref 0.7–3.1)
Lymphs: 32 %
MCH: 26.1 pg — ABNORMAL LOW (ref 26.6–33.0)
MCHC: 30.5 g/dL — ABNORMAL LOW (ref 31.5–35.7)
MCV: 86 fL (ref 79–97)
Monocytes Absolute: 0.6 10*3/uL (ref 0.1–0.9)
Monocytes: 9 %
Neutrophils Absolute: 4 10*3/uL (ref 1.4–7.0)
Neutrophils: 57 %
Platelets: 342 10*3/uL (ref 150–450)
RBC: 4.82 x10E6/uL (ref 3.77–5.28)
RDW: 13.1 % (ref 11.7–15.4)
WBC: 7 10*3/uL (ref 3.4–10.8)

## 2023-04-04 LAB — CMP14+EGFR
ALT: 10 IU/L (ref 0–32)
AST: 14 IU/L (ref 0–40)
Albumin: 4 g/dL (ref 3.9–4.9)
Alkaline Phosphatase: 104 IU/L (ref 44–121)
BUN/Creatinine Ratio: 11 (ref 9–23)
BUN: 8 mg/dL (ref 6–20)
Bilirubin Total: <0.2 mg/dL (ref 0.0–1.2)
CO2: 22 mmol/L (ref 20–29)
Calcium: 9.6 mg/dL (ref 8.7–10.2)
Chloride: 103 mmol/L (ref 96–106)
Creatinine, Ser: 0.73 mg/dL (ref 0.57–1.00)
Globulin, Total: 3.7 g/dL (ref 1.5–4.5)
Glucose: 90 mg/dL (ref 70–99)
Potassium: 5 mmol/L (ref 3.5–5.2)
Sodium: 139 mmol/L (ref 134–144)
Total Protein: 7.7 g/dL (ref 6.0–8.5)
eGFR: 111 mL/min/{1.73_m2} (ref >59)

## 2023-04-04 LAB — VITAMIN D 25 HYDROXY (VIT D DEFICIENCY, FRACTURES): Vit D, 25-Hydroxy: 12.3 ng/mL — ABNORMAL LOW (ref 30.0–100.0)

## 2023-04-04 LAB — HEMOGLOBIN A1C
Est. average glucose Bld gHb Est-mCnc: 111 mg/dL
Hgb A1c MFr Bld: 5.5 % (ref 4.8–5.6)

## 2023-04-04 LAB — VITAMIN B12: Vitamin B-12: 598 pg/mL (ref 232–1245)

## 2023-04-06 ENCOUNTER — Other Ambulatory Visit: Payer: Self-pay

## 2023-04-09 ENCOUNTER — Encounter: Payer: Self-pay | Admitting: Nurse Practitioner

## 2023-05-15 ENCOUNTER — Ambulatory Visit: Payer: Medicaid Other | Admitting: Physical Therapy
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
## 2023-05-22 ENCOUNTER — Other Ambulatory Visit: Payer: Self-pay

## 2023-05-22 ENCOUNTER — Encounter: Payer: Self-pay | Admitting: Physical Therapy

## 2023-05-22 ENCOUNTER — Ambulatory Visit: Payer: Medicaid Other | Attending: Nurse Practitioner | Admitting: Nurse Practitioner

## 2023-05-22 ENCOUNTER — Encounter: Payer: Self-pay | Admitting: Nurse Practitioner

## 2023-05-22 ENCOUNTER — Ambulatory Visit: Payer: Medicaid Other | Attending: Nurse Practitioner | Admitting: Physical Therapy

## 2023-05-22 VITALS — BP 112/75 | HR 75 | Ht 64.0 in | Wt 293.8 lb

## 2023-05-22 DIAGNOSIS — M6281 Muscle weakness (generalized): Secondary | ICD-10-CM | POA: Insufficient documentation

## 2023-05-22 DIAGNOSIS — Z6841 Body Mass Index (BMI) 40.0 and over, adult: Secondary | ICD-10-CM

## 2023-05-22 DIAGNOSIS — N393 Stress incontinence (female) (male): Secondary | ICD-10-CM | POA: Diagnosis present

## 2023-05-22 DIAGNOSIS — R7303 Prediabetes: Secondary | ICD-10-CM | POA: Diagnosis not present

## 2023-05-22 DIAGNOSIS — N3941 Urge incontinence: Secondary | ICD-10-CM | POA: Diagnosis present

## 2023-05-22 DIAGNOSIS — E66813 Obesity, class 3: Secondary | ICD-10-CM

## 2023-05-22 DIAGNOSIS — Z7985 Long-term (current) use of injectable non-insulin antidiabetic drugs: Secondary | ICD-10-CM

## 2023-05-22 DIAGNOSIS — Z23 Encounter for immunization: Secondary | ICD-10-CM | POA: Diagnosis not present
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
## 2023-05-29 ENCOUNTER — Ambulatory Visit: Payer: Medicaid Other | Admitting: Physical Therapy

## 2023-05-29 DIAGNOSIS — N3941 Urge incontinence: Secondary | ICD-10-CM | POA: Diagnosis not present

## 2023-05-29 DIAGNOSIS — M6281 Muscle weakness (generalized): Secondary | ICD-10-CM
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
## 2023-06-01 ENCOUNTER — Ambulatory Visit: Payer: Medicaid Other | Admitting: Physical Therapy

## 2023-06-12 ENCOUNTER — Encounter: Payer: Medicaid Other | Admitting: Physical Therapy

## 2023-06-17 MED ORDER — TIRZEPATIDE 5 MG/0.5ML ~~LOC~~ SOAJ
5.0000 mg | SUBCUTANEOUS | 1 refills | Status: DC
Start: 2023-06-17 — End: 2024-02-11

## 2023-06-18 ENCOUNTER — Other Ambulatory Visit: Payer: Self-pay

## 2023-06-19 ENCOUNTER — Ambulatory Visit: Payer: Medicaid Other | Admitting: Physical Therapy

## 2023-06-27 ENCOUNTER — Telehealth: Payer: Medicaid Other | Admitting: Physician Assistant

## 2023-06-27 DIAGNOSIS — H00011 Hordeolum externum right upper eyelid: Secondary | ICD-10-CM | POA: Diagnosis not present

## 2023-06-27 NOTE — Progress Notes (Signed)
Virtual Visit Consent   Emily Frank, you are scheduled for a virtual visit with a [facility] provider today. Just as with appointments in the office, your consent must be obtained to participate. Your consent will be active for this visit and any virtual visit you may have with one of our providers in the next 365 days. If you have a MyChart account, a copy of this consent can be sent to you electronically.  As this is a virtual visit, video technology does not allow for your provider to perform a traditional examination. This may limit your provider's ability to fully assess your condition. If your provider identifies any concerns that need to be evaluated in person or the need to arrange testing (such as labs, EKG, etc.), we will make arrangements to do so. Although advances in technology are sophisticated, we cannot ensure that it will always work on either your end or our end. If the connection with a video visit is poor, the visit may have to be switched to a telephone visit. With either a video or telephone visit, we are not always able to ensure that we have a secure connection.  By engaging in this virtual visit, you consent to the provision of healthcare and authorize for your insurance to be billed (if applicable) for the services provided during this visit. Depending on your insurance coverage, you may receive a charge related to this service.  I need to obtain your verbal consent now. Are you willing to proceed with your visit today? Dasiah Nees has provided verbal consent on 06/27/2023 for a virtual visit (video or telephone). Laure Kidney, [place]  Date: 06/27/2023 11:07 AM  Virtual Visit via Video Note   I, Laure Kidney, connected with  Emily Frank  ([id number], 25-Oct-1990) on 06/27/23 at 11:00 AM EST by a video-enabled telemedicine application and verified that I am speaking with the correct person using two identifiers.  Location: Patient:  Virtual Visit Location Patient: Home Provider: Virtual Visit Location Provider: Home Office   I discussed the limitations of evaluation and management by telemedicine and the availability of in person appointments. The patient expressed understanding and agreed to proceed.    History of Present Illness: Emily Frank is a 32 y.o. who identifies as a female who was assigned female at birth, and is being seen today for right eye stye.Marland Kitchen  HPI: Eye Problem  The right eye is affected. This is a new problem. The current episode started yesterday. The problem occurs constantly. The problem has been unchanged. There was no injury mechanism. The patient is experiencing no pain. She Does not wear contacts. Associated symptoms include a recent URI. Pertinent negatives include no blurred vision, eye redness, foreign body sensation, itching or photophobia. She has tried nothing for the symptoms.    Problems:  Patient Active Problem List   Diagnosis Date Noted   Encounter for tubal ligation 11/11/2022   S/P cesarean section 06/07/2022   Anemia affecting pregnancy 06/06/2022   Fetal growth restriction antepartum 05/29/2022   BMI 45.0-49.9, adult (HCC) 05/29/2022   Obesity affecting pregnancy in third trimester 05/29/2022   Encounter for supervision of other normal pregnancy, third trimester 03/19/2022   History of Roux-en-Y gastric bypass 03/08/2019   MDD (major depressive disorder), recurrent episode, severe (HCC) 07/23/2018   Panic attacks 02/23/2018   B12 deficiency 12/13/2017   Insulin resistance 12/13/2017   Depression 10/23/2017   PCOS (polycystic ovarian syndrome) 04/29/2017   Morbid obesity (HCC) 04/29/2017  Allergies:  Allergies  Allergen Reactions   Pineapple Itching    "Throat itching"   Medications:  Current Outpatient Medications:    phentermine (ADIPEX-P) 37.5 MG tablet, Take 1 tablet (37.5 mg total) by mouth daily before breakfast., Disp: 30 tablet, Rfl: 1    tirzepatide (MOUNJARO) 2.5 MG/0.5ML Pen, Inject 2.5 mg into the skin once a week. (Patient not taking: Reported on 05/22/2023), Disp: 2 mL, Rfl: 6   tirzepatide (MOUNJARO) 5 MG/0.5ML Pen, Inject 5 mg into the skin once a week., Disp: 6 mL, Rfl: 1  Observations/Objective: Patient is well-developed, well-nourished in no acute distress.  Resting comfortably  at home.  Head is normocephalic, atraumatic.  No labored breathing.  Speech is clear and coherent with logical content.  Patient is alert and oriented at baseline.  Mildly swollen right eyelid. EOM intact.   Assessment and Plan: 1. Hordeolum externum of right upper eyelid (Primary)  Initial considerations in this patient included stye, orbital and periorbital cellulitis, allergic reaction, chalazion, blepharitis, dacrocystitis, and conjunctivitis from allergic, bacterial and viral etiologies among others.  Presentation at this time on affected eyelid consistent with stye.  Patient noted to have no evidence of associated purulent drainage from the affected eye.  Patient noted to have no associated visual deficits.  Prior to discharge, we discussed return precautions, specifically for evidence of progression to periorbital or orbital cellulitis, treatment with warm compresses   Follow Up Instructions: I discussed the assessment and treatment plan with the patient. The patient was provided an opportunity to ask questions and all were answered. The patient agreed with the plan and demonstrated an understanding of the instructions.  A copy of instructions were sent to the patient via MyChart unless otherwise noted below.    The patient was advised to call back or seek an in-person evaluation if the symptoms worsen or if the condition fails to improve as anticipated.    Laure Kidney, PA-C

## 2023-06-27 NOTE — Patient Instructions (Signed)
  Emily Frank, thank you for joining Laure Kidney, PA-C for today's virtual visit.  While this provider is not your primary care provider (PCP), if your PCP is located in our provider database this encounter information will be shared with them immediately following your visit.   A [facility] MyChart account gives you access to today's visit and all your visits, tests, and labs performed at [facility] " click here if you don't have a [facility] MyChart account or go to [email or web address]  Consent: (Patient) Emily Frank provided verbal consent for this virtual visit at the beginning of the encounter.  Current Medications:  Current Outpatient Medications:    phentermine (ADIPEX-P) 37.5 MG tablet, Take 1 tablet (37.5 mg total) by mouth daily before breakfast., Disp: 30 tablet, Rfl: 1   tirzepatide (MOUNJARO) 2.5 MG/0.5ML Pen, Inject 2.5 mg into the skin once a week. (Patient not taking: Reported on 05/22/2023), Disp: 2 mL, Rfl: 6   tirzepatide (MOUNJARO) 5 MG/0.5ML Pen, Inject 5 mg into the skin once a week., Disp: 6 mL, Rfl: 1   Medications ordered in this encounter:  No orders of the defined types were placed in this encounter.    *If you need refills on other medications prior to your next appointment, please contact your pharmacy*  Follow-Up: Call back or seek an in-person evaluation if the symptoms worsen or if the condition fails to improve as anticipated.  [facility] [phone]  Other Instructions Treat with warm compresses/ Follow up with PCP in 2-3 days -  If you have been instructed to have an in-person evaluation today at a local Urgent Care facility, please use the link below. It will take you to a list of all of our available [facility], including address, phone number and hours of operation. Please do not delay care.  [facility]  If you or a family member do not have a primary  care provider, use the link below to schedule a visit and establish care. When you choose a [facility] primary care physician or advanced practice provider, you gain a long-term partner in health. Find a Primary Care Provider  Learn more about [facility] in-office and virtual care options: [facility] - Get Care Now  Days.

## 2023-07-14 ENCOUNTER — Ambulatory Visit: Payer: Medicaid Other | Admitting: Nurse Practitioner

## 2023-09-28 DIAGNOSIS — F411 Generalized anxiety disorder: Secondary | ICD-10-CM | POA: Diagnosis not present

## 2023-09-28 DIAGNOSIS — F331 Major depressive disorder, recurrent, moderate: Secondary | ICD-10-CM | POA: Diagnosis not present

## 2023-10-07 ENCOUNTER — Encounter (HOSPITAL_COMMUNITY): Payer: Self-pay | Admitting: *Deleted

## 2023-10-26 DIAGNOSIS — D509 Iron deficiency anemia, unspecified: Secondary | ICD-10-CM | POA: Diagnosis not present

## 2023-10-26 DIAGNOSIS — Z9884 Bariatric surgery status: Secondary | ICD-10-CM | POA: Diagnosis not present

## 2023-10-26 DIAGNOSIS — R635 Abnormal weight gain: Secondary | ICD-10-CM | POA: Diagnosis not present

## 2023-10-26 DIAGNOSIS — Z6841 Body Mass Index (BMI) 40.0 and over, adult: Secondary | ICD-10-CM | POA: Diagnosis not present

## 2023-10-26 DIAGNOSIS — Z01818 Encounter for other preprocedural examination: Secondary | ICD-10-CM | POA: Diagnosis not present

## 2023-10-26 DIAGNOSIS — E538 Deficiency of other specified B group vitamins: Secondary | ICD-10-CM | POA: Diagnosis not present

## 2023-10-26 DIAGNOSIS — Z713 Dietary counseling and surveillance: Secondary | ICD-10-CM | POA: Diagnosis not present

## 2023-10-28 DIAGNOSIS — F411 Generalized anxiety disorder: Secondary | ICD-10-CM | POA: Diagnosis not present

## 2023-10-28 DIAGNOSIS — F331 Major depressive disorder, recurrent, moderate: Secondary | ICD-10-CM | POA: Diagnosis not present

## 2023-11-19 DIAGNOSIS — Z9884 Bariatric surgery status: Secondary | ICD-10-CM | POA: Diagnosis not present

## 2023-11-19 DIAGNOSIS — Z713 Dietary counseling and surveillance: Secondary | ICD-10-CM | POA: Diagnosis not present

## 2023-11-23 DIAGNOSIS — Z9884 Bariatric surgery status: Secondary | ICD-10-CM | POA: Diagnosis not present

## 2023-11-23 DIAGNOSIS — E538 Deficiency of other specified B group vitamins: Secondary | ICD-10-CM | POA: Diagnosis not present

## 2023-11-23 DIAGNOSIS — R635 Abnormal weight gain: Secondary | ICD-10-CM | POA: Diagnosis not present

## 2023-11-23 DIAGNOSIS — Z01818 Encounter for other preprocedural examination: Secondary | ICD-10-CM | POA: Diagnosis not present

## 2023-11-23 DIAGNOSIS — D509 Iron deficiency anemia, unspecified: Secondary | ICD-10-CM | POA: Diagnosis not present

## 2023-11-24 DIAGNOSIS — F411 Generalized anxiety disorder: Secondary | ICD-10-CM | POA: Diagnosis not present

## 2023-11-24 DIAGNOSIS — F331 Major depressive disorder, recurrent, moderate: Secondary | ICD-10-CM | POA: Diagnosis not present

## 2023-11-26 DIAGNOSIS — R635 Abnormal weight gain: Secondary | ICD-10-CM | POA: Diagnosis not present

## 2023-12-24 DIAGNOSIS — Z6841 Body Mass Index (BMI) 40.0 and over, adult: Secondary | ICD-10-CM | POA: Diagnosis not present

## 2023-12-24 DIAGNOSIS — Z713 Dietary counseling and surveillance: Secondary | ICD-10-CM | POA: Diagnosis not present

## 2023-12-24 DIAGNOSIS — Z9884 Bariatric surgery status: Secondary | ICD-10-CM | POA: Diagnosis not present

## 2023-12-29 DIAGNOSIS — Z6841 Body Mass Index (BMI) 40.0 and over, adult: Secondary | ICD-10-CM | POA: Diagnosis not present

## 2023-12-29 DIAGNOSIS — Z9884 Bariatric surgery status: Secondary | ICD-10-CM | POA: Diagnosis not present

## 2023-12-29 DIAGNOSIS — Z713 Dietary counseling and surveillance: Secondary | ICD-10-CM | POA: Diagnosis not present

## 2024-01-05 DIAGNOSIS — F339 Major depressive disorder, recurrent, unspecified: Secondary | ICD-10-CM | POA: Diagnosis not present

## 2024-01-20 DIAGNOSIS — E66813 Obesity, class 3: Secondary | ICD-10-CM | POA: Diagnosis not present

## 2024-01-20 DIAGNOSIS — E88819 Insulin resistance, unspecified: Secondary | ICD-10-CM | POA: Diagnosis not present

## 2024-01-20 DIAGNOSIS — E282 Polycystic ovarian syndrome: Secondary | ICD-10-CM | POA: Diagnosis not present

## 2024-01-20 DIAGNOSIS — Z6841 Body Mass Index (BMI) 40.0 and over, adult: Secondary | ICD-10-CM | POA: Diagnosis not present

## 2024-01-26 DIAGNOSIS — Z9884 Bariatric surgery status: Secondary | ICD-10-CM | POA: Diagnosis not present

## 2024-01-26 DIAGNOSIS — Z713 Dietary counseling and surveillance: Secondary | ICD-10-CM | POA: Diagnosis not present

## 2024-01-26 DIAGNOSIS — Z6841 Body Mass Index (BMI) 40.0 and over, adult: Secondary | ICD-10-CM | POA: Diagnosis not present

## 2024-02-02 DIAGNOSIS — Z6841 Body Mass Index (BMI) 40.0 and over, adult: Secondary | ICD-10-CM | POA: Diagnosis not present

## 2024-02-02 DIAGNOSIS — E282 Polycystic ovarian syndrome: Secondary | ICD-10-CM | POA: Diagnosis not present

## 2024-02-02 DIAGNOSIS — Z713 Dietary counseling and surveillance: Secondary | ICD-10-CM | POA: Diagnosis not present

## 2024-02-02 DIAGNOSIS — Z9884 Bariatric surgery status: Secondary | ICD-10-CM | POA: Diagnosis not present

## 2024-02-02 DIAGNOSIS — E88819 Insulin resistance, unspecified: Secondary | ICD-10-CM | POA: Diagnosis not present

## 2024-02-05 ENCOUNTER — Ambulatory Visit: Admitting: Obstetrics

## 2024-02-11 ENCOUNTER — Other Ambulatory Visit (HOSPITAL_COMMUNITY)
Admission: RE | Admit: 2024-02-11 | Discharge: 2024-02-11 | Disposition: A | Discharge status: Home / Self Care | Source: Ambulatory Visit | Attending: Obstetrics and Gynecology | Admitting: Obstetrics and Gynecology

## 2024-02-11 ENCOUNTER — Ambulatory Visit: Admitting: Obstetrics and Gynecology

## 2024-02-11 ENCOUNTER — Encounter: Payer: Self-pay | Admitting: Obstetrics and Gynecology

## 2024-02-11 VITALS — BP 103/68 | HR 80 | Ht 64.0 in | Wt 292.0 lb

## 2024-02-11 DIAGNOSIS — Z113 Encounter for screening for infections with a predominantly sexual mode of transmission: Secondary | ICD-10-CM

## 2024-02-11 DIAGNOSIS — Z01419 Encounter for gynecological examination (general) (routine) without abnormal findings: Secondary | ICD-10-CM | POA: Insufficient documentation
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
## 2024-02-11 NOTE — Progress Notes (Signed)
33 y.o GYN presents for AEX/PAP/STD screening.

## 2024-02-12 LAB — HEPATITIS B SURFACE ANTIGEN: Hepatitis B Surface Ag: NEGATIVE

## 2024-02-12 LAB — CERVICOVAGINAL ANCILLARY ONLY
Bacterial Vaginitis (gardnerella): POSITIVE — AB
Candida Glabrata: NEGATIVE
Candida Vaginitis: NEGATIVE
Chlamydia: NEGATIVE
Comment: NEGATIVE
Comment: NEGATIVE
Comment: NEGATIVE
Comment: NEGATIVE
Comment: NEGATIVE
Comment: NORMAL
Neisseria Gonorrhea: NEGATIVE
Trichomonas: POSITIVE — AB

## 2024-02-12 LAB — RPR: RPR Ser Ql: NONREACTIVE

## 2024-02-12 LAB — HEPATITIS C ANTIBODY: Hep C Virus Ab: NONREACTIVE

## 2024-02-12 LAB — HIV ANTIBODY (ROUTINE TESTING W REFLEX): HIV Screen 4th Generation wRfx: NONREACTIVE

## 2024-02-15 ENCOUNTER — Ambulatory Visit: Payer: Self-pay | Admitting: Obstetrics and Gynecology

## 2024-02-15 ENCOUNTER — Other Ambulatory Visit: Payer: Self-pay

## 2024-02-15 MED ORDER — METRONIDAZOLE 500 MG PO TABS: 500.0000 mg | ORAL_TABLET | Freq: Two times a day (BID) | ORAL | 0 refills | Status: DC

## 2024-02-17 LAB — CYTOLOGY - PAP
Comment: NEGATIVE
Diagnosis: UNDETERMINED — AB
High risk HPV: NEGATIVE

## 2024-02-22 ENCOUNTER — Other Ambulatory Visit: Payer: Self-pay

## 2024-02-22 MED ORDER — METRONIDAZOLE 500 MG PO TABS: 500.0000 mg | ORAL_TABLET | Freq: Two times a day (BID) | ORAL | 0 refills | Status: DC

## 2024-02-29 ENCOUNTER — Other Ambulatory Visit: Payer: Self-pay

## 2024-02-29 MED ORDER — FLUCONAZOLE 150 MG PO TABS: 150.0000 mg | ORAL_TABLET | Freq: Once | ORAL | 0 refills | Status: AC

## 2024-03-03 ENCOUNTER — Ambulatory Visit
Admission: EM | Admit: 2024-03-03 | Discharge: 2024-03-03 | Disposition: A | Discharge status: Home / Self Care | Attending: Family Medicine | Admitting: Family Medicine

## 2024-03-03 DIAGNOSIS — S61051A Open bite of right thumb without damage to nail, initial encounter: Secondary | ICD-10-CM | POA: Diagnosis not present

## 2024-03-03 DIAGNOSIS — S0081XA Abrasion of other part of head, initial encounter: Secondary | ICD-10-CM | POA: Diagnosis not present

## 2024-03-03 DIAGNOSIS — W503XXA Accidental bite by another person, initial encounter: Secondary | ICD-10-CM | POA: Diagnosis not present

## 2024-03-03 MED ORDER — AMOXICILLIN-POT CLAVULANATE 875-125 MG PO TABS: 1.0000 | ORAL_TABLET | Freq: Two times a day (BID) | ORAL | 0 refills | Status: DC

## 2024-03-03 MED ORDER — BACITRACIN ZINC 500 UNIT/GM EX OINT: 1.0000 | TOPICAL_OINTMENT | Freq: Two times a day (BID) | CUTANEOUS | 0 refills | Status: DC
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
## 2024-03-28 ENCOUNTER — Other Ambulatory Visit (HOSPITAL_COMMUNITY)
Admission: RE | Admit: 2024-03-28 | Discharge: 2024-03-28 | Disposition: A | Discharge status: Home / Self Care | Source: Ambulatory Visit | Attending: Obstetrics and Gynecology | Admitting: Obstetrics and Gynecology

## 2024-03-28 ENCOUNTER — Ambulatory Visit (INDEPENDENT_AMBULATORY_CARE_PROVIDER_SITE_OTHER)

## 2024-03-28 ENCOUNTER — Ambulatory Visit

## 2024-03-28 VITALS — BP 119/82 | HR 79

## 2024-03-28 DIAGNOSIS — Z8619 Personal history of other infectious and parasitic diseases: Secondary | ICD-10-CM | POA: Diagnosis not present

## 2024-03-28 DIAGNOSIS — N898 Other specified noninflammatory disorders of vagina: Secondary | ICD-10-CM | POA: Diagnosis present

## 2024-03-28 NOTE — Progress Notes (Signed)
..  SUBJECTIVE:  33 y.o. female is in the office for [facility] for previously being + for Trichomonas. Denies abnormal vaginal bleeding or significant pelvic pain or fever. Reports vaginal itching today. No UTI symptoms.  Patient's last menstrual period was 03/03/2024 (exact date).  OBJECTIVE:  She appears well, afebrile. Urine dipstick: not done.  ASSESSMENT:  TOC Vaginal itching   PLAN:  GC, chlamydia, trichomonas, BVAG, CVAG probe sent to lab. Treatment: To be determined once lab results are received ROV prn if symptoms persist or worsen.

## 2024-03-29 ENCOUNTER — Ambulatory Visit: Payer: Self-pay | Admitting: Obstetrics and Gynecology

## 2024-03-29 LAB — CERVICOVAGINAL ANCILLARY ONLY
Bacterial Vaginitis (gardnerella): POSITIVE — AB
Candida Glabrata: NEGATIVE
Candida Vaginitis: NEGATIVE
Chlamydia: NEGATIVE
Comment: NEGATIVE
Comment: NEGATIVE
Comment: NEGATIVE
Comment: NEGATIVE
Comment: NEGATIVE
Comment: NORMAL
Neisseria Gonorrhea: NEGATIVE
Trichomonas: POSITIVE — AB

## 2024-03-29 MED ORDER — METRONIDAZOLE 500 MG PO TABS: 500.0000 mg | ORAL_TABLET | Freq: Two times a day (BID) | ORAL | 0 refills | Status: DC

## 2024-04-04 DIAGNOSIS — R635 Abnormal weight gain: Secondary | ICD-10-CM | POA: Diagnosis not present

## 2024-04-04 DIAGNOSIS — Z713 Dietary counseling and surveillance: Secondary | ICD-10-CM | POA: Diagnosis not present

## 2024-04-04 DIAGNOSIS — E88819 Insulin resistance, unspecified: Secondary | ICD-10-CM | POA: Diagnosis not present

## 2024-04-04 DIAGNOSIS — Z9884 Bariatric surgery status: Secondary | ICD-10-CM | POA: Diagnosis not present

## 2024-04-04 DIAGNOSIS — Z01818 Encounter for other preprocedural examination: Secondary | ICD-10-CM | POA: Diagnosis not present

## 2024-04-18 DIAGNOSIS — Z6841 Body Mass Index (BMI) 40.0 and over, adult: Secondary | ICD-10-CM | POA: Diagnosis not present

## 2024-04-27 DIAGNOSIS — F4320 Adjustment disorder, unspecified: Secondary | ICD-10-CM | POA: Diagnosis not present

## 2024-05-09 ENCOUNTER — Ambulatory Visit

## 2024-05-12 DIAGNOSIS — Z713 Dietary counseling and surveillance: Secondary | ICD-10-CM | POA: Diagnosis not present

## 2024-05-12 DIAGNOSIS — Z9884 Bariatric surgery status: Secondary | ICD-10-CM | POA: Diagnosis not present

## 2024-05-12 DIAGNOSIS — E669 Obesity, unspecified: Secondary | ICD-10-CM | POA: Diagnosis not present

## 2024-05-12 DIAGNOSIS — Z6841 Body Mass Index (BMI) 40.0 and over, adult: Secondary | ICD-10-CM | POA: Diagnosis not present

## 2024-06-18 ENCOUNTER — Ambulatory Visit
Admission: EM | Admit: 2024-06-18 | Discharge: 2024-06-18 | Disposition: A | Discharge status: Home / Self Care | Attending: Nurse Practitioner | Admitting: Nurse Practitioner

## 2024-06-18 ENCOUNTER — Encounter: Payer: Self-pay | Admitting: Emergency Medicine

## 2024-06-18 DIAGNOSIS — J069 Acute upper respiratory infection, unspecified: Secondary | ICD-10-CM | POA: Diagnosis not present

## 2024-06-18 DIAGNOSIS — J040 Acute laryngitis: Secondary | ICD-10-CM | POA: Diagnosis not present

## 2024-06-18 MED ORDER — MUCINEX DM MAXIMUM STRENGTH 60-1200 MG PO TB12: 1.0000 | ORAL_TABLET | Freq: Two times a day (BID) | ORAL | 0 refills | Status: AC

## 2024-06-18 MED ORDER — AZITHROMYCIN 500 MG PO TABS: 500.0000 mg | ORAL_TABLET | Freq: Every day | ORAL | 0 refills | Status: AC

## 2024-06-18 MED ORDER — PREDNISONE 20 MG PO TABS: 40.0000 mg | ORAL_TABLET | Freq: Every day | ORAL | 0 refills | Status: AC

## 2024-06-18 MED ORDER — PSEUDOEPH-BROMPHEN-DM 30-2-10 MG/5ML PO SYRP: 10.0000 mL | ORAL_SOLUTION | Freq: Four times a day (QID) | ORAL | 0 refills | Status: AC | PRN

## 2024-06-18 NOTE — ED Triage Notes (Signed)
 Pt c/o cough, lots of mucous in chest, and congestion since Sunday.

## 2024-06-18 NOTE — Discharge Instructions (Addendum)
 Your symptoms are most likely due to a respiratory infection affecting your nose, throat, or lungs. These infections are usually caused by a virus but it's possible that there may be a bacterial component starting as well.  Please take any medications prescribed to you as directed. Several over-the-counter medicines can make you more comfortable. Acetaminophen  (Tylenol ) or ibuprofen  (Advil , Motrin ) can help with fever, body aches, or sore throat pain. Decongestants such as Sudafed or nasal sprays like Flonase  may relieve nasal congestion, while saline sprays or rinses can be used often to keep your nose clear. Hoarse voice (laryngitis) will improve with resting your voice as much as possible. You can also use throat lozenges, menthol  or benzocaine sprays, warm saltwater gargles and drinking warm liquids such as teas or broths. These medicines do not cure the illness but can help you feel better as your body recovers. It is important to drink plenty of fluids to stay hydrated and help thin mucus. Aim for urine that is pale yellow. Using a cool mist humidifier at home, inhaling steam several times a day, and avoiding cool or dry air may also ease congestion. Sleeping with your head elevated can reduce post-nasal drainage, and getting enough rest each night supports recovery. Remember to replace your toothbrush once you start feeling better.  A cough may last for several weeks after a respiratory illness even when other symptoms resolve, as the airways remain irritated. As long as the cough gradually improves and no new concerning symptoms appear, this is part of normal healing.  If your symptoms worsen or you develop new problems such as trouble breathing, chest pain, or high fever, go to the emergency room right away.
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
